# Patient Record
Sex: Female | Born: 1975 | Race: Black or African American | Hispanic: No | Smoking: Former smoker
Health system: Southern US, Community
[De-identification: ages and names within clinical notes are randomized; demographics above are authoritative.]

## PROBLEM LIST (undated history)

## (undated) DIAGNOSIS — M1990 Unspecified osteoarthritis, unspecified site: Secondary | ICD-10-CM

## (undated) DIAGNOSIS — R011 Cardiac murmur, unspecified: Secondary | ICD-10-CM

## (undated) DIAGNOSIS — F32A Depression, unspecified: Secondary | ICD-10-CM

## (undated) DIAGNOSIS — D649 Anemia, unspecified: Secondary | ICD-10-CM

## (undated) DIAGNOSIS — Z9889 Other specified postprocedural states: Secondary | ICD-10-CM

## (undated) DIAGNOSIS — F419 Anxiety disorder, unspecified: Secondary | ICD-10-CM

## (undated) DIAGNOSIS — R112 Nausea with vomiting, unspecified: Secondary | ICD-10-CM

## (undated) HISTORY — PX: ABDOMINAL HYSTERECTOMY: SHX81

## (undated) HISTORY — PX: BREAST SURGERY: SHX581

---

## 1997-12-10 ENCOUNTER — Emergency Department (HOSPITAL_COMMUNITY): Admission: EM | Admit: 1997-12-10 | Discharge: 1997-12-10 | Payer: Self-pay | Admitting: Emergency Medicine

## 1998-06-30 ENCOUNTER — Emergency Department (HOSPITAL_COMMUNITY): Admission: EM | Admit: 1998-06-30 | Discharge: 1998-06-30 | Payer: Self-pay | Admitting: Emergency Medicine

## 1998-10-14 ENCOUNTER — Encounter: Admission: RE | Admit: 1998-10-14 | Discharge: 1998-10-14 | Payer: Self-pay | Admitting: Family Medicine

## 1998-10-15 ENCOUNTER — Encounter: Admission: RE | Admit: 1998-10-15 | Discharge: 1998-10-15 | Payer: Self-pay | Admitting: Family Medicine

## 1998-10-19 ENCOUNTER — Encounter: Admission: RE | Admit: 1998-10-19 | Discharge: 1998-10-19 | Payer: Self-pay | Admitting: Family Medicine

## 1998-10-26 ENCOUNTER — Ambulatory Visit (HOSPITAL_COMMUNITY): Admission: RE | Admit: 1998-10-26 | Discharge: 1998-10-26 | Payer: Self-pay | Admitting: *Deleted

## 1998-11-29 ENCOUNTER — Encounter: Admission: RE | Admit: 1998-11-29 | Discharge: 1998-11-29 | Payer: Self-pay | Admitting: Family Medicine

## 1998-12-23 ENCOUNTER — Encounter: Admission: RE | Admit: 1998-12-23 | Discharge: 1998-12-23 | Payer: Self-pay | Admitting: Family Medicine

## 1998-12-30 ENCOUNTER — Encounter: Admission: RE | Admit: 1998-12-30 | Discharge: 1998-12-30 | Payer: Self-pay | Admitting: Family Medicine

## 1999-01-05 ENCOUNTER — Inpatient Hospital Stay (HOSPITAL_COMMUNITY): Admission: AD | Admit: 1999-01-05 | Discharge: 1999-01-05 | Payer: Self-pay | Admitting: Obstetrics

## 1999-01-07 ENCOUNTER — Encounter: Admission: RE | Admit: 1999-01-07 | Discharge: 1999-01-07 | Payer: Self-pay | Admitting: Family Medicine

## 1999-02-02 ENCOUNTER — Ambulatory Visit (HOSPITAL_COMMUNITY): Admission: RE | Admit: 1999-02-02 | Discharge: 1999-02-02 | Payer: Self-pay

## 1999-02-10 ENCOUNTER — Encounter: Admission: RE | Admit: 1999-02-10 | Discharge: 1999-02-10 | Payer: Self-pay | Admitting: Family Medicine

## 1999-03-11 ENCOUNTER — Encounter: Admission: RE | Admit: 1999-03-11 | Discharge: 1999-03-11 | Payer: Self-pay | Admitting: Family Medicine

## 1999-04-04 ENCOUNTER — Inpatient Hospital Stay (HOSPITAL_COMMUNITY): Admission: AD | Admit: 1999-04-04 | Discharge: 1999-04-04 | Payer: Self-pay | Admitting: Obstetrics & Gynecology

## 1999-04-05 ENCOUNTER — Encounter: Admission: RE | Admit: 1999-04-05 | Discharge: 1999-04-05 | Payer: Self-pay | Admitting: Sports Medicine

## 1999-04-12 ENCOUNTER — Inpatient Hospital Stay (HOSPITAL_COMMUNITY): Admission: AD | Admit: 1999-04-12 | Discharge: 1999-04-12 | Payer: Self-pay | Admitting: *Deleted

## 1999-04-12 ENCOUNTER — Encounter: Admission: RE | Admit: 1999-04-12 | Discharge: 1999-04-12 | Payer: Self-pay | Admitting: Sports Medicine

## 1999-04-18 ENCOUNTER — Encounter: Admission: RE | Admit: 1999-04-18 | Discharge: 1999-04-18 | Payer: Self-pay | Admitting: Family Medicine

## 1999-04-29 ENCOUNTER — Inpatient Hospital Stay (HOSPITAL_COMMUNITY): Admission: AD | Admit: 1999-04-29 | Discharge: 1999-04-29 | Payer: Self-pay | Admitting: *Deleted

## 1999-04-29 ENCOUNTER — Encounter: Admission: RE | Admit: 1999-04-29 | Discharge: 1999-04-29 | Payer: Self-pay | Admitting: Family Medicine

## 1999-05-11 ENCOUNTER — Encounter: Admission: RE | Admit: 1999-05-11 | Discharge: 1999-05-11 | Payer: Self-pay | Admitting: Family Medicine

## 1999-05-16 ENCOUNTER — Inpatient Hospital Stay (HOSPITAL_COMMUNITY): Admission: AD | Admit: 1999-05-16 | Discharge: 1999-05-16 | Payer: Self-pay | Admitting: Obstetrics & Gynecology

## 1999-05-19 ENCOUNTER — Encounter: Admission: RE | Admit: 1999-05-19 | Discharge: 1999-05-19 | Payer: Self-pay | Admitting: Family Medicine

## 1999-05-26 ENCOUNTER — Encounter: Admission: RE | Admit: 1999-05-26 | Discharge: 1999-05-26 | Payer: Self-pay | Admitting: Family Medicine

## 1999-05-29 ENCOUNTER — Inpatient Hospital Stay (HOSPITAL_COMMUNITY): Admission: AD | Admit: 1999-05-29 | Discharge: 1999-05-29 | Payer: Self-pay | Admitting: Obstetrics & Gynecology

## 1999-06-01 ENCOUNTER — Inpatient Hospital Stay (HOSPITAL_COMMUNITY): Admission: AD | Admit: 1999-06-01 | Discharge: 1999-06-04 | Payer: Self-pay | Admitting: *Deleted

## 1999-06-13 HISTORY — PX: REDUCTION MAMMAPLASTY: SUR839

## 1999-07-06 ENCOUNTER — Encounter: Admission: RE | Admit: 1999-07-06 | Discharge: 1999-07-06 | Payer: Self-pay | Admitting: Family Medicine

## 1999-07-11 ENCOUNTER — Encounter: Admission: RE | Admit: 1999-07-11 | Discharge: 1999-07-11 | Payer: Self-pay | Admitting: Family Medicine

## 1999-10-25 ENCOUNTER — Encounter: Admission: RE | Admit: 1999-10-25 | Discharge: 1999-10-25 | Payer: Self-pay | Admitting: Family Medicine

## 2000-07-04 ENCOUNTER — Encounter: Admission: RE | Admit: 2000-07-04 | Discharge: 2000-07-04 | Payer: Self-pay | Admitting: Family Medicine

## 2000-07-09 ENCOUNTER — Emergency Department (HOSPITAL_COMMUNITY): Admission: EM | Admit: 2000-07-09 | Discharge: 2000-07-09 | Payer: Self-pay | Admitting: Emergency Medicine

## 2000-07-17 ENCOUNTER — Encounter: Admission: RE | Admit: 2000-07-17 | Discharge: 2000-07-17 | Payer: Self-pay | Admitting: Family Medicine

## 2000-07-26 ENCOUNTER — Encounter: Admission: RE | Admit: 2000-07-26 | Discharge: 2000-07-26 | Payer: Self-pay | Admitting: Family Medicine

## 2000-10-04 ENCOUNTER — Encounter: Payer: Self-pay | Admitting: Emergency Medicine

## 2000-10-04 ENCOUNTER — Emergency Department (HOSPITAL_COMMUNITY): Admission: EM | Admit: 2000-10-04 | Discharge: 2000-10-04 | Payer: Self-pay | Admitting: Emergency Medicine

## 2000-10-10 ENCOUNTER — Encounter: Admission: RE | Admit: 2000-10-10 | Discharge: 2000-10-10 | Payer: Self-pay | Admitting: Family Medicine

## 2000-11-13 ENCOUNTER — Emergency Department (HOSPITAL_COMMUNITY): Admission: EM | Admit: 2000-11-13 | Discharge: 2000-11-13 | Payer: Self-pay | Admitting: *Deleted

## 2001-04-19 ENCOUNTER — Encounter: Admission: RE | Admit: 2001-04-19 | Discharge: 2001-04-19 | Payer: Self-pay | Admitting: Family Medicine

## 2001-07-24 ENCOUNTER — Encounter (INDEPENDENT_AMBULATORY_CARE_PROVIDER_SITE_OTHER): Payer: Self-pay

## 2001-07-24 ENCOUNTER — Ambulatory Visit (HOSPITAL_BASED_OUTPATIENT_CLINIC_OR_DEPARTMENT_OTHER): Admission: RE | Admit: 2001-07-24 | Discharge: 2001-07-25 | Payer: Self-pay | Admitting: Plastic Surgery

## 2002-01-15 ENCOUNTER — Inpatient Hospital Stay (HOSPITAL_COMMUNITY): Admission: AD | Admit: 2002-01-15 | Discharge: 2002-01-15 | Payer: Self-pay | Admitting: Obstetrics and Gynecology

## 2002-01-15 ENCOUNTER — Encounter: Payer: Self-pay | Admitting: Obstetrics and Gynecology

## 2002-10-29 ENCOUNTER — Encounter: Admission: RE | Admit: 2002-10-29 | Discharge: 2002-10-29 | Payer: Self-pay | Admitting: Family Medicine

## 2003-07-22 ENCOUNTER — Emergency Department (HOSPITAL_COMMUNITY): Admission: EM | Admit: 2003-07-22 | Discharge: 2003-07-22 | Payer: Self-pay | Admitting: Emergency Medicine

## 2003-08-10 ENCOUNTER — Encounter: Admission: RE | Admit: 2003-08-10 | Discharge: 2003-08-10 | Payer: Self-pay | Admitting: Family Medicine

## 2004-01-01 ENCOUNTER — Encounter: Admission: RE | Admit: 2004-01-01 | Discharge: 2004-01-01 | Payer: Self-pay | Admitting: Family Medicine

## 2004-01-22 ENCOUNTER — Encounter: Admission: RE | Admit: 2004-01-22 | Discharge: 2004-01-22 | Payer: Self-pay | Admitting: Family Medicine

## 2004-05-12 ENCOUNTER — Ambulatory Visit: Payer: Self-pay | Admitting: Family Medicine

## 2004-10-21 ENCOUNTER — Ambulatory Visit: Payer: Self-pay | Admitting: Family Medicine

## 2004-12-23 ENCOUNTER — Ambulatory Visit: Payer: Self-pay | Admitting: Family Medicine

## 2005-05-26 ENCOUNTER — Ambulatory Visit: Payer: Self-pay | Admitting: Family Medicine

## 2005-07-13 ENCOUNTER — Encounter (INDEPENDENT_AMBULATORY_CARE_PROVIDER_SITE_OTHER): Payer: Self-pay | Admitting: *Deleted

## 2005-07-16 ENCOUNTER — Inpatient Hospital Stay (HOSPITAL_COMMUNITY): Admission: AD | Admit: 2005-07-16 | Discharge: 2005-07-16 | Payer: Self-pay | Admitting: Obstetrics & Gynecology

## 2005-07-27 ENCOUNTER — Ambulatory Visit: Payer: Self-pay | Admitting: Family Medicine

## 2005-07-31 ENCOUNTER — Inpatient Hospital Stay (HOSPITAL_COMMUNITY): Admission: RE | Admit: 2005-07-31 | Discharge: 2005-07-31 | Payer: Self-pay | Admitting: Obstetrics & Gynecology

## 2005-08-03 ENCOUNTER — Ambulatory Visit: Payer: Self-pay | Admitting: Family Medicine

## 2005-08-23 ENCOUNTER — Ambulatory Visit: Payer: Self-pay | Admitting: Family Medicine

## 2005-08-29 ENCOUNTER — Ambulatory Visit: Payer: Self-pay | Admitting: Family Medicine

## 2005-09-26 ENCOUNTER — Ambulatory Visit: Payer: Self-pay | Admitting: Family Medicine

## 2005-10-04 ENCOUNTER — Inpatient Hospital Stay (HOSPITAL_COMMUNITY): Admission: AD | Admit: 2005-10-04 | Discharge: 2005-10-04 | Payer: Self-pay | Admitting: Gynecology

## 2005-10-17 ENCOUNTER — Ambulatory Visit (HOSPITAL_COMMUNITY): Admission: RE | Admit: 2005-10-17 | Discharge: 2005-10-17 | Payer: Self-pay | Admitting: Gynecology

## 2005-10-17 ENCOUNTER — Ambulatory Visit: Payer: Self-pay | Admitting: Family Medicine

## 2005-10-25 ENCOUNTER — Inpatient Hospital Stay (HOSPITAL_COMMUNITY): Admission: AD | Admit: 2005-10-25 | Discharge: 2005-10-25 | Payer: Self-pay | Admitting: Obstetrics and Gynecology

## 2005-11-15 ENCOUNTER — Ambulatory Visit: Payer: Self-pay | Admitting: Family Medicine

## 2005-11-22 ENCOUNTER — Ambulatory Visit: Payer: Self-pay | Admitting: Family Medicine

## 2005-12-11 ENCOUNTER — Inpatient Hospital Stay (HOSPITAL_COMMUNITY): Admission: AD | Admit: 2005-12-11 | Discharge: 2005-12-11 | Payer: Self-pay | Admitting: Obstetrics & Gynecology

## 2005-12-11 ENCOUNTER — Ambulatory Visit: Payer: Self-pay | Admitting: Family Medicine

## 2005-12-12 ENCOUNTER — Ambulatory Visit: Payer: Self-pay | Admitting: Sports Medicine

## 2005-12-19 ENCOUNTER — Ambulatory Visit: Payer: Self-pay | Admitting: Sports Medicine

## 2006-01-05 ENCOUNTER — Ambulatory Visit: Payer: Self-pay | Admitting: Family Medicine

## 2006-01-11 ENCOUNTER — Inpatient Hospital Stay (HOSPITAL_COMMUNITY): Admission: AD | Admit: 2006-01-11 | Discharge: 2006-01-11 | Payer: Self-pay | Admitting: Obstetrics and Gynecology

## 2006-01-11 ENCOUNTER — Ambulatory Visit: Payer: Self-pay | Admitting: Family Medicine

## 2006-01-12 ENCOUNTER — Ambulatory Visit: Payer: Self-pay | Admitting: Obstetrics & Gynecology

## 2006-01-12 ENCOUNTER — Inpatient Hospital Stay (HOSPITAL_COMMUNITY): Admission: AD | Admit: 2006-01-12 | Discharge: 2006-01-12 | Payer: Self-pay | Admitting: Obstetrics & Gynecology

## 2006-01-18 ENCOUNTER — Ambulatory Visit: Payer: Self-pay | Admitting: Family Medicine

## 2006-01-22 ENCOUNTER — Ambulatory Visit: Payer: Self-pay | Admitting: Obstetrics and Gynecology

## 2006-01-22 ENCOUNTER — Inpatient Hospital Stay (HOSPITAL_COMMUNITY): Admission: AD | Admit: 2006-01-22 | Discharge: 2006-01-22 | Payer: Self-pay | Admitting: Family Medicine

## 2006-02-07 ENCOUNTER — Ambulatory Visit: Payer: Self-pay | Admitting: Family Medicine

## 2006-02-13 ENCOUNTER — Ambulatory Visit: Payer: Self-pay | Admitting: Family Medicine

## 2006-02-13 ENCOUNTER — Inpatient Hospital Stay (HOSPITAL_COMMUNITY): Admission: AD | Admit: 2006-02-13 | Discharge: 2006-02-13 | Payer: Self-pay | Admitting: Obstetrics and Gynecology

## 2006-02-21 ENCOUNTER — Ambulatory Visit: Payer: Self-pay | Admitting: Sports Medicine

## 2006-02-23 ENCOUNTER — Ambulatory Visit: Payer: Self-pay | Admitting: Family Medicine

## 2006-02-23 ENCOUNTER — Inpatient Hospital Stay (HOSPITAL_COMMUNITY): Admission: AD | Admit: 2006-02-23 | Discharge: 2006-02-24 | Payer: Self-pay | Admitting: Family Medicine

## 2006-02-25 ENCOUNTER — Ambulatory Visit: Payer: Self-pay | Admitting: Obstetrics and Gynecology

## 2006-02-25 ENCOUNTER — Inpatient Hospital Stay (HOSPITAL_COMMUNITY): Admission: AD | Admit: 2006-02-25 | Discharge: 2006-02-25 | Payer: Self-pay | Admitting: Gynecology

## 2006-03-01 ENCOUNTER — Ambulatory Visit: Payer: Self-pay | Admitting: Obstetrics and Gynecology

## 2006-03-01 ENCOUNTER — Inpatient Hospital Stay (HOSPITAL_COMMUNITY): Admission: AD | Admit: 2006-03-01 | Discharge: 2006-03-02 | Payer: Self-pay | Admitting: Family Medicine

## 2006-03-02 ENCOUNTER — Ambulatory Visit: Payer: Self-pay | Admitting: Family Medicine

## 2006-03-08 ENCOUNTER — Ambulatory Visit: Payer: Self-pay | Admitting: Family Medicine

## 2006-03-15 ENCOUNTER — Ambulatory Visit: Payer: Self-pay | Admitting: Family Medicine

## 2006-03-16 ENCOUNTER — Ambulatory Visit: Payer: Self-pay | Admitting: Certified Nurse Midwife

## 2006-03-16 ENCOUNTER — Inpatient Hospital Stay (HOSPITAL_COMMUNITY): Admission: AD | Admit: 2006-03-16 | Discharge: 2006-03-17 | Payer: Self-pay | Admitting: Obstetrics and Gynecology

## 2006-03-17 ENCOUNTER — Inpatient Hospital Stay (HOSPITAL_COMMUNITY): Admission: AD | Admit: 2006-03-17 | Discharge: 2006-03-17 | Payer: Self-pay | Admitting: Obstetrics and Gynecology

## 2006-03-19 ENCOUNTER — Ambulatory Visit: Payer: Self-pay | Admitting: Obstetrics & Gynecology

## 2006-03-19 ENCOUNTER — Inpatient Hospital Stay (HOSPITAL_COMMUNITY): Admission: AD | Admit: 2006-03-19 | Discharge: 2006-03-22 | Payer: Self-pay | Admitting: Gynecology

## 2006-03-26 ENCOUNTER — Ambulatory Visit: Payer: Self-pay | Admitting: Family Medicine

## 2006-04-10 ENCOUNTER — Ambulatory Visit: Payer: Self-pay | Admitting: Sports Medicine

## 2006-06-28 ENCOUNTER — Emergency Department (HOSPITAL_COMMUNITY): Admission: EM | Admit: 2006-06-28 | Discharge: 2006-06-28 | Payer: Self-pay | Admitting: Family Medicine

## 2006-08-09 DIAGNOSIS — G43909 Migraine, unspecified, not intractable, without status migrainosus: Secondary | ICD-10-CM | POA: Insufficient documentation

## 2006-08-09 DIAGNOSIS — D573 Sickle-cell trait: Secondary | ICD-10-CM

## 2006-08-10 ENCOUNTER — Encounter (INDEPENDENT_AMBULATORY_CARE_PROVIDER_SITE_OTHER): Payer: Self-pay | Admitting: *Deleted

## 2006-12-24 ENCOUNTER — Ambulatory Visit: Payer: Self-pay | Admitting: Family Medicine

## 2006-12-24 ENCOUNTER — Encounter (INDEPENDENT_AMBULATORY_CARE_PROVIDER_SITE_OTHER): Payer: Self-pay | Admitting: Family Medicine

## 2006-12-26 ENCOUNTER — Encounter (INDEPENDENT_AMBULATORY_CARE_PROVIDER_SITE_OTHER): Payer: Self-pay | Admitting: Family Medicine

## 2006-12-26 LAB — CONVERTED CEMR LAB
Albumin: 4.1 g/dL (ref 3.5–5.2)
Alkaline Phosphatase: 53 units/L (ref 39–117)
Anti Nuclear Antibody(ANA): NEGATIVE
BUN: 11 mg/dL (ref 6–23)
CO2: 25 meq/L (ref 19–32)
Cholesterol: 136 mg/dL (ref 0–200)
Glucose, Bld: 85 mg/dL (ref 70–99)
HDL: 43 mg/dL (ref >39)
Hemoglobin: 11.7 g/dL — ABNORMAL LOW (ref 12.0–15.0)
MCHC: 32 g/dL (ref 30.0–36.0)
MCV: 66.8 fL — ABNORMAL LOW (ref 78.0–100.0)
Potassium: 4.3 meq/L (ref 3.5–5.3)
RBC: 5.48 M/uL — ABNORMAL HIGH (ref 3.87–5.11)
Sodium: 141 meq/L (ref 135–145)
Total Bilirubin: 0.4 mg/dL (ref 0.3–1.2)
Total Protein: 6.9 g/dL (ref 6.0–8.3)
Triglycerides: 45 mg/dL (ref <150)
WBC: 5.5 10*3/uL (ref 4.0–10.5)

## 2006-12-31 ENCOUNTER — Telehealth: Payer: Self-pay | Admitting: *Deleted

## 2007-06-19 ENCOUNTER — Emergency Department (HOSPITAL_COMMUNITY): Admission: EM | Admit: 2007-06-19 | Discharge: 2007-06-19 | Payer: Self-pay | Admitting: *Deleted

## 2007-08-14 ENCOUNTER — Telehealth: Payer: Self-pay | Admitting: *Deleted

## 2007-08-15 ENCOUNTER — Telehealth: Payer: Self-pay | Admitting: *Deleted

## 2007-08-20 ENCOUNTER — Encounter (INDEPENDENT_AMBULATORY_CARE_PROVIDER_SITE_OTHER): Payer: Self-pay | Admitting: *Deleted

## 2007-08-20 ENCOUNTER — Ambulatory Visit: Payer: Self-pay | Admitting: Family Medicine

## 2007-08-21 ENCOUNTER — Telehealth: Payer: Self-pay | Admitting: *Deleted

## 2007-08-22 ENCOUNTER — Encounter (INDEPENDENT_AMBULATORY_CARE_PROVIDER_SITE_OTHER): Payer: Self-pay | Admitting: Obstetrics and Gynecology

## 2007-08-22 ENCOUNTER — Ambulatory Visit (HOSPITAL_COMMUNITY): Admission: RE | Admit: 2007-08-22 | Discharge: 2007-08-23 | Payer: Self-pay | Admitting: Obstetrics and Gynecology

## 2007-09-01 ENCOUNTER — Inpatient Hospital Stay (HOSPITAL_COMMUNITY): Admission: AD | Admit: 2007-09-01 | Discharge: 2007-09-01 | Payer: Self-pay | Admitting: Obstetrics and Gynecology

## 2008-04-29 ENCOUNTER — Encounter: Admission: RE | Admit: 2008-04-29 | Discharge: 2008-04-29 | Payer: Self-pay | Admitting: Obstetrics and Gynecology

## 2008-06-10 ENCOUNTER — Ambulatory Visit: Payer: Self-pay | Admitting: Family Medicine

## 2008-06-16 ENCOUNTER — Encounter: Payer: Self-pay | Admitting: Family Medicine

## 2008-06-16 ENCOUNTER — Emergency Department (HOSPITAL_COMMUNITY): Admission: EM | Admit: 2008-06-16 | Discharge: 2008-06-16 | Payer: Self-pay | Admitting: Family Medicine

## 2008-06-18 ENCOUNTER — Encounter (INDEPENDENT_AMBULATORY_CARE_PROVIDER_SITE_OTHER): Payer: Self-pay | Admitting: *Deleted

## 2008-07-15 ENCOUNTER — Emergency Department (HOSPITAL_COMMUNITY): Admission: EM | Admit: 2008-07-15 | Discharge: 2008-07-15 | Payer: Self-pay | Admitting: Emergency Medicine

## 2008-07-29 ENCOUNTER — Telehealth (INDEPENDENT_AMBULATORY_CARE_PROVIDER_SITE_OTHER): Payer: Self-pay | Admitting: *Deleted

## 2008-08-15 ENCOUNTER — Emergency Department (HOSPITAL_COMMUNITY): Admission: EM | Admit: 2008-08-15 | Discharge: 2008-08-15 | Payer: Self-pay | Admitting: Emergency Medicine

## 2008-08-17 ENCOUNTER — Emergency Department (HOSPITAL_COMMUNITY): Admission: EM | Admit: 2008-08-17 | Discharge: 2008-08-17 | Payer: Self-pay | Admitting: Emergency Medicine

## 2009-01-20 ENCOUNTER — Ambulatory Visit (HOSPITAL_COMMUNITY): Admission: RE | Admit: 2009-01-20 | Discharge: 2009-01-20 | Payer: Self-pay | Admitting: Family Medicine

## 2009-08-27 ENCOUNTER — Emergency Department (HOSPITAL_COMMUNITY): Admission: EM | Admit: 2009-08-27 | Discharge: 2009-08-27 | Payer: Self-pay | Admitting: Emergency Medicine

## 2010-04-10 ENCOUNTER — Emergency Department (HOSPITAL_COMMUNITY): Admission: EM | Admit: 2010-04-10 | Discharge: 2010-04-10 | Payer: Self-pay | Admitting: Family Medicine

## 2010-05-11 ENCOUNTER — Ambulatory Visit: Payer: Self-pay | Admitting: Family Medicine

## 2010-05-11 ENCOUNTER — Encounter: Payer: Self-pay | Admitting: Family Medicine

## 2010-05-11 DIAGNOSIS — R4586 Emotional lability: Secondary | ICD-10-CM | POA: Insufficient documentation

## 2010-05-11 DIAGNOSIS — E8941 Symptomatic postprocedural ovarian failure: Secondary | ICD-10-CM

## 2010-05-11 LAB — CONVERTED CEMR LAB
CO2: 28 meq/L (ref 19–32)
Chloride: 100 meq/L (ref 96–112)
Glucose, Bld: 82 mg/dL (ref 70–99)
HCT: 39.4 % (ref 36.0–46.0)
Hemoglobin: 13.8 g/dL (ref 12.0–15.0)
MCHC: 35 g/dL (ref 30.0–36.0)
MCV: 82.3 fL (ref 78.0–100.0)
Platelets: 310 10*3/uL (ref 150–400)
RDW: 12.6 % (ref 11.5–15.5)
Sodium: 141 meq/L (ref 135–145)

## 2010-07-12 NOTE — Assessment & Plan Note (Signed)
Summary: mood swings,df   Vital Signs:  Patient profile:   35 year old female Height:      61 inches Weight:      179 pounds BMI:     33.94 Pulse rate:   70 / minute BP sitting:   126 / 82  (right arm)  Vitals Entered By: Rochele Pages RN (May 11, 2010 2:52 PM) CC: mood swings x 1 yr  Pain Assessment Patient in pain? no        Primary Care Provider:  CAT TA MD  CC:  mood swings x 1 yr .  History of Present Illness: 35 y/o previously healthy F presents with changes in mood for the past year.    Mood changes:  Pt states that she feels her mood has changed, she has become more easy to anger and short temper.  She had a hysterectomy in 2009 for fibroids and in 2010 noticed sensations of hot flashes.  She started having mood swings in early 2011.  She thinks that this is related to her hysterectomy.  She did not take HRT after the TAH. Pt states she does not like the way she feels.  She is short temper.  Her children has noticed day.  She can feel it coming on, meaning that she can tell when she is about to become angry or frustrated.  She is worried that she may say something ugly and hurt someone's feeling.  Sleep: wakes up at least one time per night.  Overall sleeping at least 6 hrs at night.  Sleep has not changed much.    Appetite: "up and down".  [facility], who started her on appetite suppressant, which increased her energy level.  She stopped the appetite suppressant in Aug because she did not have anymore money to pay for it. She plans to resume this med in Jan.  Weight has been stable.   Symptoms have not affected her work because her coworkers are understanding.  When she feels that she is about to become upset or about to say something "ugly", she would tell them that she needs some quiet time.  Her coworkers would understand and would leave her be.  Her children have also noticed a change in her mood.  Her 16-y-o daughter would tell her 10-y-o  son, "Don't you see mom is not feeling well?  You need to be quiet." Her husband has also noticed that is is short tempered.  No Si/Hi No period of euphoria, not requiring sleep, not memory change/loss Energy level normal BM: sometimes she would not have a BM for 7 days.  Last BM was Sun, normal BM, not painful.   No heat or cold intolerance.    Habits & Providers  Alcohol-Tobacco-Diet     Tobacco Status: quit     Tobacco Counseling: to remain off tobacco products     Year Quit: 2007  Current Medications (verified): 1)  None  Allergies (verified): No Known Drug Allergies  Past History:  Past Surgical History: breast reduction - 05/26/2005,  colposcopy- 1997 - 01/22/2004 TAH in 2009 for fibroids, still has ovaries; [facility]  Social History: Works at [employer].; No tob, rare etoh; 2 children -  daughter(b. 1995), son (b2000)  Cell: [phone]  Review of Systems       per hpi   Physical Exam  General:  Well-developed,well-nourished,in no acute distress; alert,appropriate and cooperative throughout examination. Vitals reviewed.  Neurologic:  alert &  oriented X3.   Psych:  Oriented X3, memory intact for recent and remote, normally interactive, good eye contact, not anxious appearing, not depressed appearing, not agitated, not suicidal, and not homicidal.  Concentration intact.  Judgement fair.  No delusions or hallucinations.    Impression & Recommendations:  Problem # 1:  MOOD SWINGS (ICD-296.99) Assessment New Pt with some mood swings x 1 year.  She is short tempered and can feel when she is about "to say something ugly".  Depression screen was negative.  Pt has good judgement.   She had TAH in 2009 for fibroids and did not take HRT.  Gave pt reading material on HRT, but she states that she would not like to go this route.  We discussed an SSRI for mood swing.  She is amendable to this idea.  We discussed s/e: nausea, ha, insomnia, dry mouth.   Will start with zoloft 50mg  qAM x 4 wks.  Pt to rtc in 4 wks to see me.   Will check labs cbc, tsh, bmet to ensure that those are not the cause of her symptoms.   Orders: Basic Met-[facility] [id number]) CBC-[facility] [phone]) TSH-[facility] [phone]) Free T4-[facility] [phone]) [facility]- Est Level  3 (96295)  Problem # 2:  MENOPAUSE, SURGICAL (ICD-627.4) Assessment: Comment Only s/p TAH for fibroids.  Pt was not placed on HRT s/p surgery.  see above  Complete Medication List: 1)  Zoloft 50 Mg Tabs (Sertraline hcl) .Marland Kitchen.. 1 tab by mouth every morning  Patient Instructions: 1)  Please schedule a follow-up appointment in 4 weeks for mood swing.  2)  I will call you tomorrow or Friday for lab result. 3)  At that time we may choose to start a medicine to help your mood.  Prescriptions: ZOLOFT 50 MG TABS (SERTRALINE HCL) 1 tab by mouth every morning  #30 x 3   Entered and Authorized by:   Angeline Slim MD   Signed by:   Angeline Slim MD on 05/12/2010   Method used:   Electronically to        [facility]* (retail)       [address]       [address] Shipping/mailing       [place], [place]  [address]       Ph: [phone]       Fax: [phone]   RxID:   [id number]    Orders Added: 1)  Basic Met-[facility] [[id number]] 2)  CBC-[facility] [85027] 3)  TSH-[facility] [[id number]] 4)  Free T4-[facility] [[id number]] 5)  [facility]- Est Level  3 [27062]

## 2010-08-25 ENCOUNTER — Inpatient Hospital Stay (INDEPENDENT_AMBULATORY_CARE_PROVIDER_SITE_OTHER)
Admission: RE | Admit: 2010-08-25 | Discharge: 2010-08-25 | Disposition: A | Discharge status: Home / Self Care | Payer: 59 | Source: Ambulatory Visit | Attending: Family Medicine | Admitting: Family Medicine

## 2010-08-25 DIAGNOSIS — J029 Acute pharyngitis, unspecified: Secondary | ICD-10-CM

## 2010-08-25 LAB — POCT RAPID STREP A (OFFICE): Streptococcus, Group A Screen (Direct): NEGATIVE

## 2010-09-27 LAB — POCT CARDIAC MARKERS
CKMB, poc: <1 ng/mL — ABNORMAL LOW (ref 1.0–8.0)
Troponin i, poc: <0.05 ng/mL (ref 0.00–0.09)

## 2010-09-27 LAB — POCT I-STAT, CHEM 8
BUN: 10 mg/dL (ref 6–23)
Calcium, Ion: 1.17 mmol/L (ref 1.12–1.32)
Chloride: 103 mEq/L (ref 96–112)
Creatinine, Ser: 0.8 mg/dL (ref 0.4–1.2)
Glucose, Bld: 96 mg/dL (ref 70–99)
HCT: 44 % (ref 36.0–46.0)
Potassium: 3.6 mEq/L (ref 3.5–5.1)

## 2010-10-25 NOTE — Op Note (Signed)
Joanna [name], HAGGAR              ACCOUNT NO.:  [id number]   MEDICAL RECORD NO.:  [id number]          PATIENT TYPE:  OIB   LOCATION:  9303                          [facility]ILITY:  [facility]   PHYSICIAN:  Hal Morales, M.D.DATE OF BIRTH:  [birth year]   DATE OF PROCEDURE:  08/22/2007  DATE OF DISCHARGE:                               OPERATIVE REPORT   PREOPERATIVE DIAGNOSES:  Menorrhagia, uterine fibroids, status post  Filshie clip application for surgical sterilization.   POSTOPERATIVE DIAGNOSES:  Menorrhagia, uterine fibroids, status post  Filshie clip application for surgical sterilization, plus pelvic  adhesions.   PROCEDURE:  Laparoscopic lysis of adhesions, total vaginal hysterectomy  and bilateral salpingectomy for removal of Filshie clips.   SURGEON:  Hal Morales, M.D.   FIRST ASSISTANT:  Janine Limbo, M.D.   ANESTHESIA:  General orotracheal.   ESTIMATED BLOOD LOSS:  Two hundred fifty mL.   COMPLICATIONS:  None.   FINDINGS:  The uterus was enlarged to 8 week size.  There were dense  adhesions between the anterior uterus and the anterior abdominal wall.  There were Filshie clips on each of the fallopian tubes.  The ovaries  appeared within normal limits.   PROCEDURE IN DETAIL:  The patient was taken to the operating room after  appropriate identification and placed on the operating table.  After the  attainment of adequate general anesthesia she was placed in the modified  lithotomy position.  The abdomen, perineum and vagina were prepped with  multiple layers of Betadine and a Foley catheter inserted into the  bladder and connected to straight drainage.  The abdomen and perineum  were draped as a sterile field.  A Hulka tenaculum was placed on the  anterior cervix.  Subumbilical and suprapubic injection of quarter  percent Marcaine for a total of 10 mL was undertaken.  A subumbilical  incision was made and a Veress cannula was placed through that incision  into the peritoneal cavity.  A pneumoperitoneum was created with 3  liters of CO2.  The Veress cannula was removed and laparoscopic trocar  placed through that incision into the peritoneal cavity.  A laparoscope  was placed through the trocar sleeve.  Suprapubic incisions to the right  and left of midline were made and laparoscopic probe trocars placed  through those incisions into the peritoneal cavity under direct  visualization.  The above noted findings were made and documented.  Sharp dissection of the adhesions between the anterior abdominal wall  and the anterior uterus were undertaken.  Once this had been completed  the pneumoperitoneum was allowed to escape and the patient was prepared  for vaginal hysterectomy.   All instruments were removed from the trocar sleeves but the trocar  sleeves left in place.  A weighted speculum was placed in the vagina and  Lahey tenacula placed on the anterior and posterior surfaces of the  cervix.  The cervicovaginal mucosa was injected with a dilute solution  of Pitressin.  The cervix was circumscribed and the anterior vaginal  mucosa dissected off the anterior cervix.  The anterior peritoneum was  sharply entered  and the bladder blade placed.  The posterior peritoneum  was then sharply entered and tagged.  The uterosacral ligaments on  either side were clamped, cut and suture ligated.  These sutures were  held.  The paracervical tissues, uterine arteries, parametrial tissues  were then successively clamped, cut and suture ligated.  The uterus was  inverted and the upper pedicles clamped, cut, tied with a free tie and  suture ligated.  The uterus removed from the operative field.  The  fallopian tubes were then removed from each side with a combination of  clamping, cutting and suture ligating to allow removal of the fallopian  tubes and the attached Filshie clips.  Hemostasis was achieved with a  figure-of-eight suture all of 2-0 Vicryl at this  point.  Prior sutures  had been zero Vicryl.  The posterior cul-de-sac was treated with a  McCall culdoplasty suture incorporating the two uterosacral ligaments  and the intervening posterior peritoneum.  This was held.  The vaginal  angles were then created with the uterosacral ligament sutures placing  them in the anterior cuff and posterior cuff and tying down at the angle  on either side.  The remainder of the vaginal cuff was closed in a  single layer incorporating anterior vaginal mucosa, anterior peritoneum,  posterior peritoneum and posterior vaginal mucosa in each figure-of-  eight suture of zero Vicryl.  Hemostasis was noted to be adequate.  The  McCall culdoplasty suture was then tied down.  A 2 inch vaginal packing  was then placed into the vagina and the surgeon changed gloves to review  the pelvis laparoscopically.   The pneumoperitoneum was recreated and the laparoscope placed through  the subumbilical trocar sleeve.  It was documented that hemostasis was  adequate and the pelvis was copiously irrigated leaving approximately  100 mL of warm saline in the pelvis.  All instruments were then removed  from the peritoneal cavity under direct visualization as the CO2 was  allowed to escape.  The subumbilical incision was closed with a fascial  suture of zero Vicryl then subcuticular sutures of 3-0 Vicryl.  The  suprapubic incisions were closed with subcuticular sutures of 3-0  Vicryl.  Sterile dressings were applied.  The patient was awakened from  general anesthesia and taken to the recovery room in satisfactory  condition having tolerated the procedure well with sponge and instrument  counts correct.   SPECIMENS TO PATHOLOGY:  Uterus and cervix and right and left fallopian  tubes.      Hal Morales, M.D.  Electronically Signed     VPH/MEDQ  D:  08/22/2007  T:  08/23/2007  Job:  [id number]

## 2010-10-25 NOTE — H&P (Signed)
NAMEAARA, JACQUOT              ACCOUNT NO.:  [id number]   MEDICAL RECORD NO.:  [id number]          PATIENT TYPE:  AMB   LOCATION:  SDC                           [facility]ILITY:  [facility]   PHYSICIAN:  Hal Morales, M.D.DATE OF BIRTH:  [birth year]   DATE OF ADMISSION:  08/19/2007  DATE OF DISCHARGE:                              HISTORY & PHYSICAL   HISTORY OF PRESENT ILLNESS:  Joanna [name] is a 35 year old married African  American female para 3-0-2-3 who presents for laparoscopically-assisted  vaginal hysterectomy because of symptomatic uterine fibroids and  menorrhagia.  For the past several years, the patient has endured a 5-7-  day menstrual flow requiring a tampon with a pad that she changes every  2 hours.  In spite of this, she would occasionally soil her clothes.  She reports cramping with these periods which are rated at a 7/10 on a  10-point pain scale, but will respond to Extra Strength Tylenol or  ibuprofen 800 mg and decrease to 3/10 on a 10-point pain scale.  Adding  to these discomforts is the fact that the patient would have the same  menstrual-like bleeding 2 weeks after her period along with more  cramping.  She denies any urinary tract symptoms, changes in bowel  habits, fever, vaginitis symptoms, nausea or vomiting.  She does however  admit to dyspareunia.  A pelvic ultrasound with Sonohystogram done in  February 2009 showed a uterus measuring 10.4 x 7.09 x 5.57 cm and normal-  appearing ovaries.  A posterior fibroid measuring 1.71 x 1.59 x 1.62 cm  and a submucosal fibroid measuring 3.0 x 2.61 x 2.78 cm meters was also  noted.  On the Sonohystogram, a hypoechoic fundal mass separate from the  posterior fibroid was also observed measuring 2.63 x 2.02 cm.  The  patient underwent an endometrial biopsy at the same time and it returned  negative for hyperplasia, atypia or malignancy.  A review of both  medical and surgical options were given to the patient to include  hormone  therapy, hysteroscopic fibroid resection and endometrial  ablation along with hysterectomy.  Given that the patient has concluded  her desire for childbearing, she has consented to proceed with  hysterectomy as a means of definitive therapy.   PAST OBSTETRICS HISTORY:  Gravida 5, para 3-0-2-3 the patient had two  spontaneous vaginal births in 1995 and 2000, and a cesarean section in  2007.   GYNECOLOGY HISTORY:  Menarche 35 years old.  The patient's menstrual  flow is as previously outlined.  She uses bilateral tubal ligation as  her method of contraception.  Denies any history of abnormal Pap smears.  Has a remote history of chlamydia.  Her last normal Pap smear was  January 2009.   MEDICAL HISTORY:  1. Sickle cell trait.  2. Anemia.  3. Migraines.  4. Heart murmur.   SURGICAL HISTORY:  1. In 1997, D&C.  2. In 2002, breast reduction.  3. In 2007, bilateral tubal ligation.  4. The patient denies any history of blood transfusion.  She does      admit to  severe nausea and vomiting with anesthesia.   FAMILY HISTORY:  Positive for cardiovascular disease, asthma, thyroid  disease, rheumatoid arthritis, diabetes mellitus and renal disease.   HABITS:  She does not use tobacco, alcohol or illicit drugs.   CURRENT MEDICATIONS:  Iron which she takes once daily.   ALLERGIES:  No known drug allergies.   REVIEW OF SYSTEMS:  Chest pain was shortness of breath with exertion,  palpitations, joint pain.  She denies any headache, vision changes,  unilateral weakness, dizziness, rash, dysphasia and except as is  mentioned in history of present illness, the patient's review of systems  is negative (the patient will undergo evaluation for her symptoms with  her family physician prior to surgery).   PHYSICAL EXAMINATION:  VITAL SIGNS:  Blood pressure is 138/88, weight is  180, pulse is 78, height is 5 feet, 3/4 inches.  GENERAL:  Neck is supple without masses.  There is no thyromegaly or   cervical adenopathy.  HEART:  Regular rate and rhythm.  There is a 2/6 systolic ejection  murmur at the left sternal border.  LUNGS:  Clear.  There are no wheezes, rales or rhonchi.  BACK:  No CVA tenderness.  ABDOMEN:  No tenderness, masses or organomegaly.  EXTREMITIES:  No clubbing, cyanosis or edema.  PELVIC:  EG/BUS was within normal limits.  Vagina is rugous.  Cervix is  nontender without lesions.  Uterus appears 8-10 weeks size and  irregular.  There is no tenderness.  Adnexa no tenderness or masses.  Rectovaginal without masses.   IMPRESSION:  1. Symptomatic uterine fibroids.  2. Menorrhagia.  3. Metrorrhagia.  4. Dysmenorrhea.   DISPOSITION:  A discussion was held was held with the patient regarding  the indications for her procedure along with its risks which include but  are not limited to reaction to anesthesia, damage to adjacent organs,  infection, excessive bleeding, the possibility that her surgery may have  to be completed using an open abdominal incision, and the possibility  that her ovaries may need to be removed should they appear diseased.  The patient verbalized understanding of these risk and has consented to  proceed with a laparoscopically assisted vaginal hysterectomy at [facility] [place] on August 22, 2007. She underwent evaluation of  her chest pain by her primary md who cleared her for surgery.      [place]mira J. Adline Peals.      Hal Morales, M.D.  Electronically Signed   EJP/MEDQ  D:  08/19/2007  T:  08/20/2007  Job:  [id number]

## 2010-10-28 NOTE — Op Note (Signed)
[place]  Patient:    Joanna [name], Joanna [name] Visit Number: [id number] MRN: [id number]          Service Type: DSU Location: [facility] Attending Physician:  Loura Halt Ii Dictated by:   Alfredia Ferguson, M.D. Proc. Date: 07/24/01 Admit Date:  07/24/2001                             Operative Report  PREOPERATIVE DIAGNOSIS:  Bilateral macromastia.  POSTOPERATIVE DIAGNOSIS:  Bilateral macromastia.  OPERATION PERFORMED:  Bilateral reduction mammoplasty, 898 g removed from left breast and 918 g removed from right breast.  SURGEON:  Alfredia Ferguson, M.D.  FIRST ASSISTANT:  Teena Irani. Odis Luster, M.D.  ANESTHESIA:  General endotracheal anesthesia.  INDICATION FOR SURGERY:  This is a 35 year old black female, who desired elective reduction mammoplasty.  She complains of symptoms of heavy breasts, which cause grooving in her shoulders from the weight on her bra.  She also has neck and upper back pain.  She wished to have the breasts reduced to a small C if possible.  DESCRIPTION OF OPERATION:  Skin marks were placed on the patient in a standing position outlining a Wise skin pattern.  Inferior pedicle technique will be used for this procedure.  The patient was taken to the operating room, where her chest was prepped and draped in a sterile fashion after adequate general endotracheal anesthesia had been given.  An 8 cm wide base was drawn for her inferior pedicle in the central portion of the breast.  A 42 mm diameter circle was drawn around the nipple area.  The previously placed skin marks were now incised through the dermis sharply.  The pedicle was also incised through the skin.  The areolar was incised at the 42 mm diameter mark.  The pedicle was deepithelialized with the nipple-areolar complex left in place. The pedicle was dissected away from the surrounding breast tissue using electrocautery cutting on the bias away from the central portion of  the pedicle in order to ensure adequate connections to pectoralis fascia for vascular integrity.  Once the pedicle had been dissected completely from the surrounding breast tissue, it was inspected for its vascular connections and they were felt to be adequate.  The medial breast flap was now elevated off of the chest wall at the level of the pectoralis fascia.  This flap was undermined for a distance of 3-5 cm.  The central breast flap was likewise undermined off the pectoralis fascia for a distance of 5-6 cm.  The lateral breast flap was elevated in a similar fashion.  The excess dermoglandular tissue between the pedicle and the desired position of the medial, superior, and lateral breast flaps was now dissected away from a medial to lateral direction, thinning the medial breast flap to approximately 3 cm in thickness. The central excess breast tissue was dissected away from the central breast flap cutting on the bias leaving the thickness of the central breast flap approximately 5 cm.  Laterally, the excess tissue was dissected away from the lateral breast flap leaving the lateral breast flap approximately 2-3 cm in thickness.  The excess tissue had now been completely dissected away and was passed off for weight.  A total of 918 g was removed from this right breast. Hemostasis was meticulously accomplished using electrocautery.  The wound was copiously irrigated with saline irrigation.  After hemostasis had been achieved, closure was begun by  uniting the inferior corner of the vertical limb of the medial and lateral breast flaps to the central portion of the inframammary crease with a 2-0 Vicryl suture.  The superior corner of the vertical limb of the incision was closed with a similar suture.  Temporary cover over the right breast was placed and attention was directed to the left breast, where an identical procedure was performed.  Here, 898 g was removed from the left side.  After  achieving hemostasis and irrigation, closure of the two breasts was carried out simultaneously.  The inferior and superior corner of the vertical limb of the incision on the left side were closed in a similar fashion to the right.  The inframammary crease incision was closed using multiple 3-0 Vicryl sutures for the dermis.  The areolar was placed in its new location and fixed into position using a combination of 3-0 Vicryl for the dermis and a running 4-0 Vicryl subcuticular.  The vertical limb of the incision was closed using interrupted 3-0 Vicryl sutures in the dermis.  Skin edges were approximated using Steri-Strips.  The chest was cleansed, dried, and a bulky dressing was placed around the breasts with a circumferential wrap of 6 inch Ace bandage.  Estimated blood loss was 100 cc.  The patient was awakened, extubated, and transported to the recovery room in satisfactory condition. Dictated by:   Alfredia Ferguson, M.D. Attending Physician:  Loura Halt Ii DD:  07/24/01 TD:  07/24/01 Job: [id number]

## 2010-10-28 NOTE — Op Note (Signed)
Joanna [name], Joanna [name]              ACCOUNT NO.:  [id number]   MEDICAL RECORD NO.:  [id number]          PATIENT TYPE:  INP   LOCATION:  9102                          FACILITY:  [facility]   PHYSICIAN:  Lesly Dukes, M.D. DATE OF BIRTH:  06/08/1976   DATE OF PROCEDURE:  03/19/2006  DATE OF DISCHARGE:                                 OPERATIVE REPORT   PREOPERATIVE DIAGNOSIS:  Intrauterine pregnancy at [redacted] weeks gestation, cord  presentation.   POSTOPERATIVE DIAGNOSIS:  Intrauterine pregnancy at [redacted] weeks gestation, cord  presentation.   PROCEDURE:  Primary low transverse cesarean section.   SURGEON:  Lesly Dukes, M.D.   ASSISTANT:  Wilburt Finlay, M.D.   ANESTHESIA:  General.   SPECIMENS:  Placenta to LNP.   ESTIMATED BLOOD LOSS:  1000 mL.   COMPLICATIONS:  None.   FINDINGS:  Viable female infant, Apgars 8 and 9.   REASON FOR PROCEDURE:  The patient is a 35 year old gravida 5, para 2-0-2-2-  , at [redacted] weeks gestation who presented with spontaneous onset of labor.  On  cervical exam, the patient was found to be dilated 4-5 cm but unable to feel  presenting part.  Ultrasound was done which showed a cord presentation.  The  patient was taken for cesarean section.   DESCRIPTION OF PROCEDURE:  The patient was taken to the operating room where  she was given general anesthesia.  She was then prepped and draped in the  normal sterile fashion in the dorsal supine position with a leftward tilt.  A Pfannenstiel skin incision was then made with the scalpel and carried  through to the underlying layer of fascia.  The fascia was incised in the  midline and the incision extended laterally with the Mayo scissors.  The  superior aspect of the fascial incision was then grasped with Kocher clamps,  elevated, and the underlying rectus muscles dissected off bluntly.  Attention was then turned to the inferior aspect of the incision which, in a  similar fashion, was grasped, tented up with the  Kocher clamps, and the  rectus muscles dissected off bluntly.  The rectus muscles were then  separated in the midline, the peritoneum identified, and entered sharply  with good visualization of the bladder.  The bladder blade was inserted and  the lower uterine segment incised in a transverse fashion with the scalpel.  The bladder blade was removed and the infant's head delivered  atraumatically.  The nose and mouth were suctioned with the DeLee bulb and  cord clamped and tied.  There was a nuchal cord x1.  The infant was handed  off to the pediatrician.  Cord blood was obtained.  The placenta was then  removed manually, the uterus exteriorized, and cleared of all clots and  debris.  The uterine incision was repaired with #1 chromic in a running  locked fashion.  Pressure was applied on the uterine incision with good  hemostasis which was re-checked 3-4 times with excellent hemostasis.  The  gutters were cleared of all clots and the fascia was reapproximated with 0  Vicryl in a  running fashion.  The skin was closed with staples.  The patient tolerated  the procedure well.  Sponge, lap, and needle counts were correct x2.  2  grams of Ancef were given at cord clamp.  The patient was taken to the  recovery room in stable condition.     ______________________________  Paticia Stack, MD    ______________________________  Lesly Dukes, M.D.    LNJ/MEDQ  D:  03/19/2006  T:  03/20/2006  Job:  [id number]

## 2010-10-28 NOTE — Discharge Summary (Signed)
Joanna [name], Joanna [name]              ACCOUNT NO.:  [id number]   MEDICAL RECORD NO.:  [id number]          PATIENT TYPE:  OIB   LOCATION:  9303                          [facility]ACILITY:  [facility]   PHYSICIAN:  Hal Morales, M.D.DATE OF BIRTH:  [birth year]   DATE OF ADMISSION:  08/22/2007  DATE OF DISCHARGE:  08/23/2007                               DISCHARGE SUMMARY   DISCHARGE DIAGNOSES:  1. Symptomatic uterine fibroids.  2. Menorrhagia.   OPERATION:  On the date of admission, the patient underwent a  laparoscopically assisted vaginal hysterectomy with lysis of adhesions,  tolerating procedure well.  The patient was found to have a uterus  enlarged to approximately 8-week size with adhesions between the  anterior uterus and abdominal wall.   HISTORY OF PRESENT ILLNESS:  Joanna [name] is a 35 year old married African  American female, para 3-0-2-3, who presents for laparoscopically  assisted vaginal hysterectomy because of symptomatic uterine fibroids  and menorrhagia.  Please see the patient's dictated history and physical  examination for details.   PREOPERATIVE PHYSICAL EXAM:  VITAL SIGNS:  Blood pressure is 138/88,  weight is 180 pounds, pulse is 78, and height is 5 feet 3-1/4 inches  tall.  GENERAL:  Within normal limits.  HEART:  However, it was noted on her heart exam that she had a 2/6  systolic ejection murmur at the left sternal border.  PELVIC:  EGBUS was within normal limits.  Vagina was rugose.  Cervix was  nontender without lesions.  Uterus appeared 8-10 week size and  irregular.  There was no tenderness.  Adnexa without tenderness or  masses.  Rectovaginal without masses.   HOSPITAL COURSE:  On the day of admission, the patient underwent  aforementioned procedures, tolerating them all well.  Postoperative  course was unremarkable with the patient tolerating a postop hemoglobin  of 9.2 (preop hemoglobin was 11.4).  On postop day #1, the patient had  resumed bowel and bladder  function and was therefore deemed ready for  discharge home.   DISCHARGE MEDICATIONS:  1. Ibuprofen 600 mg every 6 hours for 3 days, then as needed for pain.  2. Percocet 1-2 tablets every 4 hours as needed for pain (5/325 mg).  3. Feosol 1 tablet daily.   FOLLOWUP:  The patient is to call [facility] to schedule a  6-week postoperative visit with Dr. Pennie Rushing.   DISCHARGE INSTRUCTIONS:  The patient was given a copy of [facility] postoperative instruction sheet.  She was further  advised to avoid driving for 2 weeks, heavy lifting for 4 weeks, and  intercourse for 6 weeks, then she may shower, bathe, walk up steps, and  should increase her activity slowly.  The patient's diet was without  restriction.  Final pathology was not available at the time of this  dictation.      Joanna [name].      Hal Morales, M.D.  Electronically Signed    EJP/MEDQ  D:  09/06/2007  T:  09/06/2007  Job:  [id number]

## 2010-10-28 NOTE — Op Note (Signed)
NAMELEN, KLUVER              ACCOUNT NO.:  [id number]   MEDICAL RECORD NO.:  [id number]          PATIENT TYPE:  INP   LOCATION:  9102                          [facility]ACILITY:  [facility]   PHYSICIAN:  Lesly Dukes, M.D. DATE OF BIRTH:  February 23, 1976   DATE OF PROCEDURE:  03/19/2006  DATE OF DISCHARGE:                                 OPERATIVE REPORT   ADDENDUM:   PROCEDURE:  Primary low transverse cesarean section with bilateral tubal  ligation.   When uterus was exteriorized, fallopian tubes were identified, separated  from its long ligaments.  The left tube was identified, grasped with Babcock  clamps, and stapled approximately 4 cm from the cornual region.  The right  fallopian tube was also identified and stapled in a similar fashion.  The  rest of the procedure as dictated previously.     ______________________________  Paticia Stack, MD    ______________________________  Lesly Dukes, M.D.    LNJ/MEDQ  D:  03/19/2006  T:  03/20/2006  Job:  [id number]

## 2010-10-28 NOTE — Discharge Summary (Signed)
NAMEAIYANNA, AWTREY              ACCOUNT NO.:  [id number]   MEDICAL RECORD NO.:  [id number]          PATIENT TYPE:  INP   LOCATION:  9102                          [facility]ILITY:  [facility]   PHYSICIAN:  Ginger Carne, MD  DATE OF BIRTH:  1975/06/30   DATE OF ADMISSION:  03/19/2006  DATE OF DISCHARGE:  03/22/2006                                 DISCHARGE SUMMARY   ADMISSION DIAGNOSIS:  Term intrauterine pregnancy, in active labor.   DISCHARGE DIAGNOSES:  1. History of sickle cell trait.  2. Intrauterine pregnancy, delivered via low transverse cesarean section      secondary to cord presentation.  3. Bilateral tubal ligation.   HOSPITAL COURSE:  Ms. Jearld Fenton is a 35 year old female with a history of sickle  cell trait, who was a G5, now P3 0-2-3, who delivered intrauterine pregnancy  at 40 weeks via low transverse cesarean section secondary to cord  presentation.  Patient had an uncomplicated postop period and also underwent  bilateral tubal ligation on March 19, 2006 and delivered a viable female in  vertex position weighing 7 pounds, 8 ounces.  Uncomplicated postop course  and went home on postop day #3.   DISCHARGE LABS:  Blood type O+.  Hemoglobin 7.7.  GBS positive.  HIV  nonreactive.  RPR nonreactive.  Rubella nonimmune.  Patient declined Rubella  vaccination prior to discharge.   DISCHARGE MEDICATIONS:  1. Ibuprofen 200 mg over-the-counter every 4-6 hours p.r.n. pain.  2. Percocet 5/325 1 tab p.o. q.6h. p.r.n. severe pain.  3. Continue prenatal vitamin 1 tab daily as well as Colace 100 mg p.o.      b.i.d.   Patient will follow up at [facility] on March 26, 2006 for  staple removal and will follow up with her primary care physician, Dr. Corliss Marcus within six weeks.      Alanson Puls, M.D.      Ginger Carne, MD  Electronically Signed    MR/MEDQ  D:  03/22/2006  T:  03/23/2006  Job:  [id number]

## 2011-03-02 LAB — DIFFERENTIAL
Basophils Relative: 0
Eosinophils Relative: 1
Lymphs Abs: 2.2
Monocytes Relative: 7
Neutro Abs: 3.2

## 2011-03-02 LAB — CBC
HCT: 33 — ABNORMAL LOW
Hemoglobin: 10.7 — ABNORMAL LOW
Platelets: 430 — ABNORMAL HIGH
RBC: 5.09
WBC: 5.9

## 2011-03-02 LAB — I-STAT 8, (EC8 V) (CONVERTED LAB)
BUN: 7
Bicarbonate: 26.1 — ABNORMAL HIGH
Chloride: 106
Glucose, Bld: 90
HCT: 35 — ABNORMAL LOW
Hemoglobin: 11.9 — ABNORMAL LOW
Operator id: 198171
Potassium: 3.7
Sodium: 140
TCO2: 27
pCO2, Ven: 46.3
pH, Ven: 7.359 — ABNORMAL HIGH

## 2011-03-02 LAB — POCT CARDIAC MARKERS
CKMB, poc: <1 — ABNORMAL LOW
Myoglobin, poc: 49.5
Operator id: 198171
Troponin i, poc: <0.05

## 2011-03-02 LAB — PREGNANCY, URINE: Preg Test, Ur: NEGATIVE

## 2011-03-02 LAB — POCT I-STAT CREATININE: Creatinine, Ser: 0.8

## 2011-03-06 LAB — DIFFERENTIAL
Basophils Absolute: 0
Eosinophils Relative: 0
Lymphocytes Relative: 18
Monocytes Absolute: 0.9

## 2011-03-06 LAB — URINALYSIS, ROUTINE W REFLEX MICROSCOPIC
Nitrite: NEGATIVE
Protein, ur: NEGATIVE
Urobilinogen, UA: 0.2

## 2011-03-06 LAB — CBC
HCT: 36
MCHC: 32.5
MCV: 65.1 — ABNORMAL LOW
Platelets: 372
Platelets: 475 — ABNORMAL HIGH
RDW: 19.3 — ABNORMAL HIGH
RDW: 19.4 — ABNORMAL HIGH
WBC: 11.6 — ABNORMAL HIGH

## 2011-03-06 LAB — COMPREHENSIVE METABOLIC PANEL
AST: 15
Albumin: 3.5
Alkaline Phosphatase: 52
Chloride: 104
Creatinine, Ser: 0.71
GFR calc Af Amer: >60
Potassium: 3.5
Sodium: 136
Total Bilirubin: 1

## 2011-07-21 ENCOUNTER — Emergency Department (HOSPITAL_COMMUNITY)
Admission: EM | Admit: 2011-07-21 | Discharge: 2011-07-21 | Disposition: A | Discharge status: Home / Self Care | Payer: 59 | Attending: Emergency Medicine | Admitting: Emergency Medicine

## 2011-07-21 ENCOUNTER — Encounter (HOSPITAL_COMMUNITY): Payer: Self-pay | Admitting: Emergency Medicine

## 2011-07-21 DIAGNOSIS — G43909 Migraine, unspecified, not intractable, without status migrainosus: Secondary | ICD-10-CM | POA: Insufficient documentation

## 2011-07-21 DIAGNOSIS — R112 Nausea with vomiting, unspecified: Secondary | ICD-10-CM | POA: Insufficient documentation

## 2011-07-21 MED ORDER — PROMETHAZINE HCL 25 MG/ML IJ SOLN
25.0000 mg | Freq: Four times a day (QID) | INTRAMUSCULAR | Status: DC | PRN
  Administered 2011-07-21: 25 mg via INTRAVENOUS
  Filled 2011-07-21: qty 1

## 2011-07-21 MED ORDER — KETOROLAC TROMETHAMINE 30 MG/ML IJ SOLN
30.0000 mg | Freq: Once | INTRAMUSCULAR | Status: AC
  Administered 2011-07-21: 30 mg via INTRAVENOUS
  Filled 2011-07-21: qty 1

## 2011-07-21 MED ORDER — DIPHENHYDRAMINE HCL 50 MG/ML IJ SOLN
25.0000 mg | Freq: Once | INTRAMUSCULAR | Status: AC
  Administered 2011-07-21: 25 mg via INTRAVENOUS
  Filled 2011-07-21: qty 1

## 2011-07-21 MED ORDER — SODIUM CHLORIDE 0.9 % IV BOLUS (SEPSIS)
1000.0000 mL | Freq: Once | INTRAVENOUS | Status: AC
  Administered 2011-07-21: 1000 mL via INTRAVENOUS

## 2011-07-21 NOTE — ED Notes (Signed)
Family at bedside. 

## 2011-07-21 NOTE — ED Provider Notes (Signed)
History     CSN: [id number]  Arrival date & time 07/21/11  0559   First MD Initiated Contact with Patient 07/21/11 [phone]      Chief Complaint  Patient presents with  . Migraine    (Consider location/radiation/quality/duration/timing/severity/associated sxs/prior treatment) HPI Comments: Patient reports symptoms of her typical migraine that began yesterday morning when she woke up.  States she started with a what she thought was a normal.  Headache, and took one Tylenol, but the headache progressed throughout the day.  Pain is located behind her bilateral eyes and for head.  Associated nausea and vomiting, sensitivity to light.  Denies fever, head trauma, neck stiffness, "worst" headache of her life.  Denies any change from her typical migraine symptoms.  States her last migraine that was.  This bad was several years ago.  Her migraines are managed by her primary care provider.  Patient is a 36 y.o. female presenting with migraine. The history is provided by the patient.  Migraine This is a recurrent problem. The current episode started yesterday. The problem has been gradually worsening. Associated symptoms include nausea and vomiting. Pertinent negatives include no abdominal pain, change in bowel habit, chest pain, chills, fever, neck pain, numbness, urinary symptoms, visual change or weakness. Exacerbated by: light. She has tried acetaminophen for the symptoms. The treatment provided no relief.  Migraine This is a recurrent problem. The current episode started yesterday. The problem has been gradually worsening. Pertinent negatives include no chest pain, no abdominal pain and no shortness of breath. Exacerbated by: light. She has tried acetaminophen for the symptoms. The treatment provided no relief.    Past Medical History  Diagnosis Date  . Migraine     Past Surgical History  Procedure Date  . Abdominal hysterectomy     No family history on file.  History  Substance Use Topics    . Smoking status: Never Smoker   . Smokeless tobacco: Not on file  . Alcohol Use: No    OB History    Grav Para Term Preterm Abortions TAB SAB Ect Mult Living                  Review of Systems  Constitutional: Negative for fever, chills, activity change and appetite change.  HENT: Negative for neck pain and neck stiffness.   Respiratory: Negative for shortness of breath.   Cardiovascular: Negative for chest pain.  Gastrointestinal: Positive for nausea and vomiting. Negative for abdominal pain, diarrhea and change in bowel habit.  Genitourinary: Negative for dysuria, urgency and frequency.  Neurological: Negative for weakness and numbness.  Psychiatric/Behavioral: Negative for confusion.  All other systems reviewed and are negative.    Allergies  Review of patient's allergies indicates no known allergies.  Home Medications   Current Outpatient Rx  Name Route Sig Dispense Refill  . SERTRALINE HCL 50 MG PO TABS Oral Take 50 mg by mouth every morning.       BP 123/81  Pulse 107  Temp 98 F (36.7 C)  Resp 16  Wt 180 lb (81.647 kg)  SpO2 99%  Physical Exam  Nursing note and vitals reviewed. Constitutional: She is oriented to person, place, and time. She appears well-developed and well-nourished. No distress.  HENT:  Head: Normocephalic and atraumatic.  Mouth/Throat: No oropharyngeal exudate.  Neck: Normal range of motion. Neck supple.  Cardiovascular: Normal rate, regular rhythm and normal heart sounds.   Pulmonary/Chest: Effort normal and breath sounds normal. No respiratory distress. She has no  wheezes. She has no rales.  Abdominal: Soft. She exhibits no distension. There is no tenderness. There is no rebound and no guarding.  Musculoskeletal: Normal range of motion.  Neurological: She is alert and oriented to person, place, and time. She has normal strength. No cranial nerve deficit or sensory deficit. She exhibits normal muscle tone. Coordination normal. GCS eye  subscore is 4. GCS verbal subscore is 5. GCS motor subscore is 6.       CN II-XII intact, EOMs intact, no pronator drift, grip strengths equal bilaterally;strength 5/5 in all extremities, sensation is intact.    Skin: She is not diaphoretic.  Psychiatric: She has a normal mood and affect. Her behavior is normal. Thought content normal.    ED Course  Procedures (including critical care time)  Labs Reviewed - No data to display No results found.  7:05 AM Patient sleeping comfortably.  Spouse is bedside.    8:15 AM Patient is pain free.    1. Migraine headache       MDM    Nontoxic, afebrile.  Patient with history of migraines presenting with migraine that is exactly the same as her typical migraine with no change in quality of symptoms.  Patient has no neurological complaints or deficits. This is not the worst headache of her life and was not sudden in onset.  She does not have a fever, she has had no head trauma.  Patient's pain was relieved in the emergency department and she was discharged home with Primary care provider  followup.        Rise Patience, [place] 07/21/11 1404  Medical screening examination/treatment/procedure(s) were performed by non-physician practitioner and as supervising physician I was immediately available for consultation/collaboration.  Sunnie Nielsen, MD 07/22/11 [phone]

## 2011-07-21 NOTE — ED Notes (Signed)
Pt alert, presents with family, c/o migraine h/a, onset yesterday, hx of same, c/o nausea, emesis, light sensitivity, ambulates to triage, steady gait noted

## 2011-07-21 NOTE — ED Notes (Signed)
MD/PA Chad E at bedside.

## 2011-07-21 NOTE — ED Notes (Signed)
Patient is resting comfortably. 

## 2011-09-13 ENCOUNTER — Ambulatory Visit: Payer: 59 | Admitting: Family Medicine

## 2011-09-19 ENCOUNTER — Encounter: Payer: Self-pay | Admitting: Family Medicine

## 2011-09-19 ENCOUNTER — Ambulatory Visit (INDEPENDENT_AMBULATORY_CARE_PROVIDER_SITE_OTHER): Payer: 59 | Admitting: Family Medicine

## 2011-09-19 VITALS — BP 115/81 | HR 94 | Ht 61.0 in | Wt 181.0 lb

## 2011-09-19 DIAGNOSIS — G43909 Migraine, unspecified, not intractable, without status migrainosus: Secondary | ICD-10-CM

## 2011-09-19 MED ORDER — SUMATRIPTAN SUCCINATE 50 MG PO TABS: 50.0000 mg | ORAL_TABLET | ORAL | Status: DC | PRN

## 2011-09-19 NOTE — Patient Instructions (Signed)
Dear Mrs. Manson Passey,   Thank you for coming to clinic today. Please read below regarding the issues that we discussed.   Headaches - I think the first step to controlling this is to stop phentermine. This can act like a caffeine and raise your blood pressure, which may cause headaches. Also, I'm giving you a pill call imitrex to take as needed for migraines. DO NOT take for a regular headache. If you need to take this more than once every 2 weeks, then we will start a controller medication.    Please follow up with Dr. Fara Boros in 4 weeks . Please call earlier if you have any questions or concerns.   Sincerely,   Dr. Clinton Sawyer

## 2011-09-24 NOTE — Assessment & Plan Note (Signed)
Headaches likely exacerbated by use of phentermine. Patient counseled on association and has decided to quit taking it. She will now take sumatriptan for abortive therapy. She will be started on controller medication if she needs the sumatriptan > 2 x per month.

## 2011-09-24 NOTE — Progress Notes (Signed)
  Subjective:    Patient ID: Joanna [name], female    DOB: 1976/01/05, 36 y.o.   MRN: [id number]  HPI Joanna [name] presents today for counseling on headaches. She does not currently have a headache.   Headaches - Has a history of migraines that started at age 36 after delivery of her first child. She had a hiatus for several years, but they returned in February. Since then, she describes 1 severe headache a week. She believes that the triggers are caffeine and stress. She also notes that she started taking phentermine in January for weight loss.   She describes the headaches as bilateral, located mainly behind her eyes. They are always associated with nausea, vomiting, photophobia and phonophobia. For relief she went to the ED on 2/8 and received a treatment with benadryl, promethazine, ketoralac, and a fluid bolus. Since that time she has been using Excedrin migraine as needed. She tried imitrex once, but vomited shortly after taking it. She denies ever having been on a controller medication.    Review of Systems Positive see HPI  Negative for facial droop, numbness or tingling in extremities, syncope; otherwise negative     Objective:   Physical Exam BP 115/81  Pulse 94  Ht 5\' 1"  (1.549 m)  Wt 181 lb (82.101 kg)  BMI 34.20 kg/m2 Gen: alert, oriented, non-distressed HEENT: PERRLA, EOMI     Assessment & Plan:  36 year old female with poorly controlled primary headaches that have recurred recently, which is possibly related to her use of phentermine.

## 2011-11-07 ENCOUNTER — Emergency Department (HOSPITAL_COMMUNITY)
Admission: EM | Admit: 2011-11-07 | Discharge: 2011-11-07 | Disposition: A | Discharge status: Home / Self Care | Payer: 59 | Source: Home / Self Care | Attending: Family Medicine | Admitting: Family Medicine

## 2011-11-07 DIAGNOSIS — J069 Acute upper respiratory infection, unspecified: Secondary | ICD-10-CM

## 2011-11-07 MED ORDER — BENZONATATE 100 MG PO CAPS: 100.0000 mg | ORAL_CAPSULE | Freq: Three times a day (TID) | ORAL | Status: AC

## 2011-11-07 MED ORDER — IBUPROFEN 600 MG PO TABS: 600.0000 mg | ORAL_TABLET | Freq: Three times a day (TID) | ORAL | Status: AC | PRN

## 2011-11-07 MED ORDER — CETIRIZINE-PSEUDOEPHEDRINE ER 5-120 MG PO TB12: 1.0000 | ORAL_TABLET | Freq: Two times a day (BID) | ORAL | Status: DC | PRN

## 2011-11-07 MED ORDER — DEXTROMETHORPHAN-GUAIFENESIN 10-300 MG/5ML PO LIQD: 5.0000 mL | Freq: Three times a day (TID) | ORAL | Status: DC | PRN

## 2011-11-07 NOTE — ED Notes (Signed)
Pt. Stated, I've had a cough and congested since Sunday

## 2011-11-07 NOTE — Discharge Instructions (Signed)
Is likely you have a viral upper respiratory infection versus seasonal allergies. Is very important top keep well hydrated. Take the prescribed medications as instructed. Take ibuprofen scheduled for the next 24-48 hours take with food and plenty of liquids as it can upset your stomach, can alternate with Tylenol over-the-counter 500 mg every 6 hours as needed for fever or pain. Use nasal saline spray at least 3 times a day. (simply saline is over the counter) Return if late onset of fever, difficulty breathing, chest pain or not keeping fluids down.

## 2011-11-07 NOTE — ED Provider Notes (Signed)
History     CSN: [id number]  Arrival date & time 11/07/11  1654   First MD Initiated Contact with Patient 11/07/11 1706      Chief Complaint  Patient presents with  . Cough    (Consider location/radiation/quality/duration/timing/severity/associated sxs/prior treatment) HPI Comments: 36 year old female with history of migraines. Here complaining of cough and congestion for 2 days. Describes she started with scratchy throat, sneezing and clear rhinorrhea on day one followed by nonproductive persistent and recurrent cough. Cough is dry and not associated to chest pain. Denies current sore throat. No fever or chills. No headache or abdominal pain. No nausea vomiting or diarrhea. No rashes. No known sick contacts.   Past Medical History  Diagnosis Date  . Migraine     Past Surgical History  Procedure Date  . Abdominal hysterectomy     No family history on file.  History  Substance Use Topics  . Smoking status: Never Smoker   . Smokeless tobacco: Not on file  . Alcohol Use: No    OB History    Grav Para Term Preterm Abortions TAB SAB Ect Mult Living                  Review of Systems  Constitutional: Negative for fever, chills, appetite change and fatigue.  HENT: Positive for congestion, rhinorrhea and sneezing. Negative for sore throat, trouble swallowing, neck pain and sinus pressure.   Eyes: Negative for discharge.  Respiratory: Positive for cough. Negative for shortness of breath and wheezing.   Cardiovascular: Negative for chest pain, palpitations and leg swelling.  Gastrointestinal: Negative for nausea, vomiting, abdominal pain and diarrhea.  Musculoskeletal: Negative for myalgias and arthralgias.  Skin: Negative for rash.  Neurological: Negative for dizziness and headaches.    Allergies  Review of patient's allergies indicates no known allergies.  Home Medications   Current Outpatient Rx  Name Route Sig Dispense Refill  . BENZONATATE 100 MG PO CAPS Oral  Take 1 capsule (100 mg total) by mouth every 8 (eight) hours. 21 capsule 0  . CETIRIZINE-PSEUDOEPHEDRINE ER 5-120 MG PO TB12 Oral Take 1 tablet by mouth 2 (two) times daily as needed for allergies or rhinitis. 30 tablet 0  . DEXTROMETHORPHAN-GUAIFENESIN 10-300 MG/5ML PO LIQD Oral Take 5 mLs by mouth every 8 (eight) hours as needed. 1 Bottle 0  . IBUPROFEN 600 MG PO TABS Oral Take 1 tablet (600 mg total) by mouth every 8 (eight) hours as needed for pain or fever. 20 tablet 0  . SUMATRIPTAN SUCCINATE 50 MG PO TABS Oral Take 1 tablet (50 mg total) by mouth every 2 (two) hours as needed for migraine. 4 tablet 0    BP 130/86  Pulse 83  Temp(Src) 98 F (36.7 C) (Oral)  Resp 16  SpO2 99%  Physical Exam  Nursing note and vitals reviewed. Constitutional: She is oriented to person, place, and time. She appears well-developed and well-nourished. No distress.  HENT:  Head: Normocephalic and atraumatic.       Nasal Congestion with erythema and swelling of nasal turbinates, clear rhinorrhea. mild pharyngeal erythema no exudates. No uvula deviation. No trismus. TM's normal   Eyes: Conjunctivae are normal. Pupils are equal, round, and reactive to light. Right eye exhibits no discharge. Left eye exhibits no discharge. No scleral icterus.  Neck: Normal range of motion. Neck supple. No JVD present.  Cardiovascular: Normal heart sounds.   Pulmonary/Chest: Effort normal and breath sounds normal. No respiratory distress. She has no wheezes. She has  no rales. She exhibits no tenderness.  Lymphadenopathy:    She has no cervical adenopathy.  Neurological: She is alert and oriented to person, place, and time.  Skin: No rash noted.    ED Course  Procedures (including critical care time)  Labs Reviewed - No data to display No results found.   1. URI (upper respiratory infection)       MDM  Impress viral upper respiratory infection versus seasonal allergies. Treated with ibuprofen, cetirizine  pseudoephedrine, Tessalon Perles and dextromethorphan guaifenesin. Supportive care discussed and also provided in writing. Asked to return if late onset of fever and worsening symptoms like chest pain difficulty breathing or not keeping fluids down.        Sharin Grave, MD 11/08/11 1940

## 2011-11-08 NOTE — ED Notes (Signed)
Nettie Elm, pharmacist from [facility] on [address] called stating that the the rx for Dextromethorphan-guaifenesin had a dosage that was not available and wanted to know if it could be changed to 10-100/81ml.  I spoke with K. Sophia, PA and she ok'ed the change.

## 2012-03-18 ENCOUNTER — Ambulatory Visit (INDEPENDENT_AMBULATORY_CARE_PROVIDER_SITE_OTHER): Payer: 59 | Admitting: Family Medicine

## 2012-03-18 ENCOUNTER — Encounter: Payer: Self-pay | Admitting: Family Medicine

## 2012-03-18 VITALS — BP 126/78 | HR 81 | Temp 98.5°F | Ht 61.0 in | Wt 188.0 lb

## 2012-03-18 DIAGNOSIS — R52 Pain, unspecified: Secondary | ICD-10-CM

## 2012-03-18 DIAGNOSIS — IMO0001 Reserved for inherently not codable concepts without codable children: Secondary | ICD-10-CM

## 2012-03-18 DIAGNOSIS — M791 Myalgia, unspecified site: Secondary | ICD-10-CM

## 2012-03-18 MED ORDER — MELOXICAM 15 MG PO TABS: 15.0000 mg | ORAL_TABLET | Freq: Every day | ORAL | Status: DC

## 2012-03-18 NOTE — Patient Instructions (Addendum)
Dear Mrs. Manson Passey,   Thank you for coming to clinic today. Please read below regarding the issues that we discussed.   Muscle and joint pain - Let us check a few labs and go from there. Please take the meloxicam as needed. I will call you with the results.   Sincerely,   Dr. Clinton Sawyer

## 2012-03-18 NOTE — Progress Notes (Signed)
  Subjective:    Patient ID: Joanna [name], female    DOB: 03-28-1976, 36 y.o.   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 per orthopedist; no family history of similar symptoms   Review of Systems Negative unless stated in the HPI    Objective:   Physical Exam BP 126/78  Pulse 81  Temp 98.5 F (36.9 C) (Oral)  Ht 5\' 1"  (1.549 m)  Wt 188 lb (85.276 kg)  BMI 35.52 kg/m2 Gen: AAF, non ill non distressed, alert and oriented x 4, pleasant and conversant HEENT: NCAT, PERRLA, EOMI, no lymphadenopathy MSK: 5/5 strength of upper and lower extremities; no tenderness to palpation of paraspinal muscles; no effusions of knees, wrists, or IP joints Skin: no rashes       Assessment & Plan:  36 year old F with intermittent generalized pain of unknown etiology.

## 2012-03-19 DIAGNOSIS — R52 Pain, unspecified: Secondary | ICD-10-CM | POA: Insufficient documentation

## 2012-03-19 NOTE — Assessment & Plan Note (Signed)
Unknown etiology. She has negative ANA and RF in 2008. That coupled with the absence of systemic symptoms or signs on physical exam decrease the likelihood of a primary rheumatologic or paraneoplastic issue. She has a normal cervical spine X-ray from 2010, making osteoarthritis unlikely. It has similar symptoms to a primary pain syndrome such as fibromyalgia, but no consistent findings on physical exam.  - Since NSAIDS have been effective, we will try Meloxicam for 2 weeks.  - Check ESR/CRP - F/u 2 weeks.

## 2012-03-20 ENCOUNTER — Telehealth: Payer: Self-pay | Admitting: Family Medicine

## 2012-03-20 NOTE — Telephone Encounter (Signed)
Patient called to let her know that ESR/CRP are normal. She will take Meloxicam for 2 weeks and return for follow up eval.

## 2012-04-05 ENCOUNTER — Encounter: Payer: Self-pay | Admitting: Family Medicine

## 2012-04-05 ENCOUNTER — Ambulatory Visit (INDEPENDENT_AMBULATORY_CARE_PROVIDER_SITE_OTHER): Payer: 59 | Admitting: Family Medicine

## 2012-04-05 VITALS — BP 103/70 | HR 89 | Temp 98.7°F | Ht 61.0 in | Wt 193.0 lb

## 2012-04-05 DIAGNOSIS — R52 Pain, unspecified: Secondary | ICD-10-CM

## 2012-04-08 NOTE — Progress Notes (Signed)
Patient ID: Joanna [name], female   DOB: 09/04/1975, 36 y.o.   MRN: [id number]  Patient left after checking in and prior to being seen by the physician.

## 2012-06-01 ENCOUNTER — Emergency Department (HOSPITAL_COMMUNITY)
Admission: EM | Admit: 2012-06-01 | Discharge: 2012-06-01 | Disposition: A | Discharge status: Home / Self Care | Payer: 59 | Source: Home / Self Care

## 2012-06-01 ENCOUNTER — Encounter (HOSPITAL_COMMUNITY): Payer: Self-pay | Admitting: Emergency Medicine

## 2012-06-01 DIAGNOSIS — J111 Influenza due to unidentified influenza virus with other respiratory manifestations: Secondary | ICD-10-CM

## 2012-06-01 HISTORY — DX: Unspecified osteoarthritis, unspecified site: M19.90

## 2012-06-01 MED ORDER — OSELTAMIVIR PHOSPHATE 75 MG PO CAPS: 75.0000 mg | ORAL_CAPSULE | Freq: Two times a day (BID) | ORAL | Status: DC

## 2012-06-01 MED ORDER — GUAIFENESIN-CODEINE 100-10 MG/5ML PO SYRP: 5.0000 mL | ORAL_SOLUTION | Freq: Three times a day (TID) | ORAL | Status: DC | PRN

## 2012-06-01 NOTE — ED Notes (Signed)
Pt c/o cold sx x1 day... Sx include: fevers, chest congestion, headaches, dry cough, nauseas... Denies: vomiting, diarrhea... Pt is alert w/no signs of acute distress.

## 2012-06-01 NOTE — ED Provider Notes (Signed)
Joanna [name] is a 36 y.o. female who presents to Urgent Care today for flulike illness. Patient notes mild fevers and chills, body aches, headache, cough, feeling weakness starting 24 hours ago. 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  She did receive a flu shot this year.  She denies any trouble breathing. She has tried Tylenol which has helped a bit.  She feels well otherwise   PMH reviewed. Significant for migraines History  Substance Use Topics  . Smoking status: Former Games developer  . Smokeless tobacco: Not on file  . Alcohol Use: No   ROS as above Medications reviewed. No current facility-administered medications for this encounter.   Current Outpatient Prescriptions  Medication Sig Dispense Refill  . guaiFENesin-codeine (ROBITUSSIN AC) 100-10 MG/5ML syrup Take 5 mLs by mouth 3 (three) times daily as needed for cough.  120 mL  0  . oseltamivir (TAMIFLU) 75 MG capsule Take 1 capsule (75 mg total) by mouth 2 (two) times daily.  10 capsule  0  . SUMAtriptan (IMITREX) 50 MG tablet Take 1 tablet (50 mg total) by mouth every 2 (two) hours as needed for migraine.  4 tablet  0    Exam:  BP 129/85  Pulse 106  Temp 98.8 F (37.1 C) (Oral)  Resp 18  SpO2 97% Gen: Well NAD HEENT: EOMI,  MMM, normal tympanic membranes bilaterally erythematous posterior pharynx.  Lungs: CTABL Nl WOB Heart: RRR no MRG Abd: NABS, NT, ND Exts: Non edematous BL  LE, warm and well perfused.   No results found for this or any previous visit (from the past 24 hour(s)). No results found.  Assessment and Plan: 36 y.o. female with likely influenza.  Pretest probability is high enough that it is reasonable just to treat.  Plan to use Tamiflu additionally we'll prescribe some codeine cough medicine for bothersome cough.  Recommend Tylenol or ibuprofen as needed for fevers chills and other symptoms.  Discussed warning signs or symptoms. Please see discharge  instructions. Patient expresses understanding. Come back as needed.  Work note provided.     Rodolph Bong, MD 06/01/12 [phone]

## 2012-06-01 NOTE — ED Provider Notes (Signed)
Medical screening examination/treatment/procedure(s) were performed by resident physician or non-physician practitioner and as supervising physician I was immediately available for consultation/collaboration.   Barkley Bruns MD.    Linna Hoff, MD 06/01/12 [phone]

## 2012-08-04 ENCOUNTER — Emergency Department (HOSPITAL_COMMUNITY)
Admission: EM | Admit: 2012-08-04 | Discharge: 2012-08-04 | Disposition: A | Discharge status: Home / Self Care | Payer: 59 | Source: Home / Self Care | Attending: Emergency Medicine | Admitting: Emergency Medicine

## 2012-08-04 ENCOUNTER — Encounter (HOSPITAL_COMMUNITY): Payer: Self-pay | Admitting: Emergency Medicine

## 2012-08-04 DIAGNOSIS — N3000 Acute cystitis without hematuria: Secondary | ICD-10-CM

## 2012-08-04 LAB — POCT URINALYSIS DIP (DEVICE)
Bilirubin Urine: NEGATIVE
Nitrite: NEGATIVE
pH: 6 (ref 5.0–8.0)

## 2012-08-04 MED ORDER — CEPHALEXIN 500 MG PO CAPS: 500.0000 mg | ORAL_CAPSULE | Freq: Three times a day (TID) | ORAL | Status: DC

## 2012-08-04 MED ORDER — FLUCONAZOLE 150 MG PO TABS: 150.0000 mg | ORAL_TABLET | Freq: Once | ORAL | Status: DC

## 2012-08-04 NOTE — ED Provider Notes (Signed)
Chief Complaint  Patient presents with  . Hematuria    x today    History of Present Illness:   Joanna [name] is a 37 year old female who has had a one-day history of dysuria, urgency, frequency, hematuria with clots, cloudy urine, and malodorous urine. She denies any fever, chills, nausea, vomiting, lower back, or lower abdominal pain. She's had no GYN problems. She has had urinary tract infections in the past, but her last was over a year ago.  Review of Systems:  Other than noted above, the patient denies any of the following symptoms: General:  No fevers, chills, sweats, aches, or fatigue. GI:  No abdominal pain, back pain, nausea, vomiting, diarrhea, or constipation. GU:  No dysuria, frequency, urgency, hematuria, or incontinence. GYN:  No discharge, itching, vulvar pain or lesions, pelvic pain, or abnormal vaginal bleeding.  PMFSH:  Past medical history, family history, social history, meds, and allergies were reviewed.  Physical Exam:   Vital signs:  BP 118/70  Pulse 88  Temp(Src) 98.5 F (36.9 C) (Oral)  Resp 17  SpO2 100% Gen:  Alert, oriented, in no distress. Lungs:  Clear to auscultation, no wheezes, rales or rhonchi. Heart:  Regular rhythm, no gallop or murmer. Abdomen:  Flat and soft. There was slight suprapubic pain to palpation.  No guarding, or rebound.  No hepato-splenomegaly or mass.  Bowel sounds were normally active.  No hernia. Back:  No CVA tenderness.  Skin:  Clear, warm and dry.  Labs:   Results for orders placed during the hospital encounter of 08/04/12  POCT URINALYSIS DIP (DEVICE)      Result Value Range   Glucose, UA NEGATIVE  NEGATIVE mg/dL   Bilirubin Urine NEGATIVE  NEGATIVE   Ketones, ur NEGATIVE  NEGATIVE mg/dL   Specific Gravity, Urine 1.020  1.005 - 1.030   Hgb urine dipstick LARGE (*) NEGATIVE   pH 6.0  5.0 - 8.0   Protein, ur 100 (*) NEGATIVE mg/dL   Urobilinogen, UA 1.0  0.0 - 1.0 mg/dL   Nitrite NEGATIVE  NEGATIVE   Leukocytes, UA  SMALL (*) NEGATIVE     Other Labs Obtained at Urgent Care Center:  A urine culture was obtained.  Results are pending at this time and we will call about any positive results.  Assessment: The encounter diagnosis was Acute cystitis.   Plan:   1.  The following meds were prescribed:   Discharge Medication List as of 08/04/2012  3:17 PM    START taking these medications   Details  cephALEXin (KEFLEX) 500 MG capsule Take 1 capsule (500 mg total) by mouth 3 (three) times daily., Starting 08/04/2012, Until Discontinued, Normal    fluconazole (DIFLUCAN) 150 MG tablet Take 1 tablet (150 mg total) by mouth once., Starting 08/04/2012, Normal       2.  The patient was instructed in symptomatic care and handouts were given. 3.  The patient was told to return if becoming worse in any way, if no better in 3 or 4 days, and given some red flag symptoms that would indicate earlier return. 4.  The patient was told to avoid intercourse for 10 days, get extra fluids, and return for a follow up with her primary care doctor at the completion of treatment for a repeat UA and culture.     Reuben Likes, MD 08/04/12 1800

## 2012-08-04 NOTE — ED Notes (Signed)
Pt c/o blood in urine x today. Pt states for the past several weeks urine has been cloudy with a foul odor. Pt denies back or abdominal pain.

## 2012-08-10 LAB — URINE CULTURE: Colony Count: 50000

## 2012-08-11 ENCOUNTER — Telehealth (HOSPITAL_COMMUNITY): Payer: Self-pay | Admitting: Emergency Medicine

## 2012-08-11 MED ORDER — CIPROFLOXACIN HCL 500 MG PO TABS: 500.0000 mg | ORAL_TABLET | Freq: Two times a day (BID) | ORAL | Status: DC

## 2012-08-11 NOTE — ED Notes (Signed)
Her urine culture grew out enterococcus which is resistant to cefazolin, therefore the Keflex will not be sufficient to eradicate it. It is sensitive to Cipro, so we'll go ahead and send a prescription for this in to her pharmacy.  Reuben Likes, MD 08/11/12 2025

## 2012-08-11 NOTE — Telephone Encounter (Signed)
Message copied by Reuben Likes on Sun Aug 11, 2012  8:25 PM ------      Message from: Vassie Moselle      Created: Sun Aug 11, 2012  2:44 PM      Regarding: lab       Urine culture: 50,000 colonies Enterococcus.  You treated with Keflex.  It is resistant to Cefazolin.  Does she need Rx. Changed?      Vassie Moselle      08/11/2012            ----- Message -----         From: [facility] [place] Interface         Sent: 08/06/2012   2:03 AM           To: [facility] Follow Up                   ------

## 2012-08-13 ENCOUNTER — Telehealth (HOSPITAL_COMMUNITY): Payer: Self-pay | Admitting: *Deleted

## 2012-08-13 NOTE — ED Notes (Signed)
Urine culture: 50,000 colonies Enterobacter Aerogenes. Pt. treated with Keflex and it is resistant.  Message to Dr. Lorenz Coaster on 3/2 and he changed Rx. to Cipro. Instructed to tell pt. to stop Keflex and start Cipro.  I called pt. and left a message to call. Vassie Moselle 08/13/2012

## 2012-08-14 ENCOUNTER — Telehealth (HOSPITAL_COMMUNITY): Payer: Self-pay | Admitting: *Deleted

## 2012-08-16 ENCOUNTER — Telehealth (HOSPITAL_COMMUNITY): Payer: Self-pay | Admitting: *Deleted

## 2012-08-16 NOTE — ED Notes (Signed)
Pt. called back.  Pt. verified x 2 and given results. Pt. told to stop the Keflex and start the Cipro. Pt. told where the Rx. was e-prescribed. Pt. said she only has 1 pill left.  Pt. told that the bacteria was resistant to the Keflex and that is why the Rx. Had to be changed.  I told her I had been calling her since 3/4. Vassie Moselle 08/16/2012

## 2012-11-20 ENCOUNTER — Ambulatory Visit (INDEPENDENT_AMBULATORY_CARE_PROVIDER_SITE_OTHER): Payer: 59 | Admitting: Family Medicine

## 2012-11-20 ENCOUNTER — Encounter: Payer: Self-pay | Admitting: Family Medicine

## 2012-11-20 VITALS — BP 117/78 | HR 81 | Temp 98.2°F | Wt 194.0 lb

## 2012-11-20 DIAGNOSIS — M549 Dorsalgia, unspecified: Secondary | ICD-10-CM

## 2012-11-20 LAB — POCT URINALYSIS DIPSTICK
Blood, UA: NEGATIVE
Nitrite, UA: NEGATIVE
Protein, UA: NEGATIVE
Urobilinogen, UA: 0.2
pH, UA: 7.5

## 2012-11-20 NOTE — Patient Instructions (Addendum)
Follow-up with me in 1 week  Get an xray. I will call you with results within 2 days of xray.

## 2012-11-20 NOTE — Progress Notes (Signed)
  Subjective:    Patient ID: Joanna [name], female    DOB: 11/24/1975, 37 y.o.   MRN: [id number]  HPI # SDA: persistent back pain  She was diagnose with acute cystitis at Urgent Care on 02/23. She was treated with antibiotics and the pain improved (especially lower tract symptoms), however, the back pain has come back and remained for the past few weeks.  Exacerbated by: nothing Alleviated by: nothing  The pain is not affected with position changes.  She denies difficulty breathing, dysuria/urgency/freqeuncy, fevers, chills, nausea, vomiting, diarrhea, constipation, decreased oral intake, difficulty swallowing She is s/p hysterectomy (ovaries still intact) for fibroids.   Review of Systems Per HPI  Allergies, medication, past medical history reviewed.  Smoking status noted. Generalized pain, mood swings, migraines     Objective:   Physical Exam GEN: NAD; well-nourished, -appearing CV: RRR PULM: NI WOB; CTAB without w/r/r EXT: no edema ABD: soft, NT, ND, obese, NABS BACK: diffuse mild pain lumbar area bilaterally; no mid-line tenderness; no tenderness elicited (pain is not worse with palpation); no CVA tenderness; pain is not worse with ROM of back/spine (ROM intact) NEURO: moves all extremities well; strength intact upper and lower extremities  UA: normal   Urine culture 08/04/2012: 50,000 enterobacter aerogenes     Assessment & Plan:

## 2012-11-21 DIAGNOSIS — M549 Dorsalgia, unspecified: Secondary | ICD-10-CM | POA: Insufficient documentation

## 2012-11-21 DIAGNOSIS — M545 Low back pain: Secondary | ICD-10-CM | POA: Insufficient documentation

## 2012-11-21 NOTE — Assessment & Plan Note (Signed)
Etiology unclear. Diffuse, non reproducible lumbar pain pain radiating to bilaterally flanks for the past several months.  D/Dx: constipation, ovarian cysts?, fibromyalgia -Check AXR; will call with results -If negative, consider pelvic ultrasound, CMET, CBC, ESR, ruling-out STI

## 2013-01-10 ENCOUNTER — Encounter: Payer: Self-pay | Admitting: Family Medicine

## 2013-01-10 ENCOUNTER — Ambulatory Visit (INDEPENDENT_AMBULATORY_CARE_PROVIDER_SITE_OTHER): Payer: 59 | Admitting: Family Medicine

## 2013-01-10 VITALS — BP 118/81 | HR 78 | Ht 61.0 in | Wt 192.0 lb

## 2013-01-10 DIAGNOSIS — Z0000 Encounter for general adult medical examination without abnormal findings: Secondary | ICD-10-CM | POA: Insufficient documentation

## 2013-01-10 DIAGNOSIS — M549 Dorsalgia, unspecified: Secondary | ICD-10-CM

## 2013-01-10 DIAGNOSIS — K5900 Constipation, unspecified: Secondary | ICD-10-CM

## 2013-01-10 MED ORDER — POLYETHYLENE GLYCOL 3350 17 GM/SCOOP PO POWD: 17.0000 g | Freq: Two times a day (BID) | ORAL | Status: DC | PRN

## 2013-01-10 NOTE — Assessment & Plan Note (Signed)
Pt came for her pap smear, discussed that she has no need for further paps as she had a hysterectomy in 2009 for benign indications. We also discussed screening for cholesterol and diabetes which she reports having at her job and were normal. She is overweight and is planning to try to address this but has made no changes so far.

## 2013-01-10 NOTE — Patient Instructions (Signed)
Today we discussed your health maintenance and back pain.  Because of your hysterectomy, you no longer need to get pap smears done.  For your back pain and constipation, I believe these two may be linked. A trial of miralax may be very beneficial for this. Please take miralax twice daily for 1 week to clear out your bowels and see whether this helps. If you are still having pain if 2-3 weeks please come back and we can explore other possible causes.  Please continue trying to increase your activity level and decrease your calorie intake by limiting your portions, especially of sugary drinks, sweets and fatty foods.

## 2013-01-10 NOTE — Progress Notes (Signed)
  Subjective:    Patient ID: Joanna [name], female    DOB: August 07, 1975, 37 y.o.   MRN: [id number]  Gynecologic Exam Associated symptoms include abdominal pain, back pain, constipation and flank pain. Pertinent negatives include no diarrhea, dysuria, fever, frequency, hematuria, nausea, urgency or vomiting.   Back Pain: Patient presents for presents evaluation of low back problems.  Symptoms have been present for 10 months and include dull aching back pain radiating to abdomen. Initial inciting event: none. Symptoms are worst: all day. Alleviating factors identifiable by patient are none. Exacerbating factors identifiable by patient are sitting. Treatments so far initiated by patient: none Previous lower back problems: sciatica. Previous workup: sent for abdominal xray by Dr. Willaim Bane but did not go.. Previous treatments: none.  Pt reports feeling gassy pain along with her back pain which radiates around to her abdomen. She goes several days between bowel movements and reports her pain is improved after going.   Review of Systems  Constitutional: Negative for fever and unexpected weight change.  HENT: Negative.   Eyes: Negative.   Respiratory: Negative for cough, shortness of breath and wheezing.   Cardiovascular: Positive for leg swelling. Negative for chest pain and palpitations.  Gastrointestinal: Positive for abdominal pain, constipation, abdominal distention and rectal pain. Negative for nausea, vomiting, diarrhea and blood in stool.  Endocrine: Negative.   Genitourinary: Positive for flank pain. Negative for dysuria, urgency, frequency and hematuria.  Musculoskeletal: Positive for back pain. Negative for gait problem.  Skin: Negative.   Neurological: Negative for weakness and numbness.  Hematological: Negative.   Psychiatric/Behavioral: Negative.        Objective:   Physical Exam  Vitals reviewed. Constitutional: She is oriented to person, place, and time. She appears well-developed  and well-nourished. No distress.  HENT:  Head: Normocephalic and atraumatic.  Right Ear: External ear normal.  Left Ear: External ear normal.  Nose: Nose normal.  Mouth/Throat: Oropharynx is clear and moist.  Eyes: Conjunctivae are normal. Right eye exhibits no discharge. Left eye exhibits no discharge. No scleral icterus.  Cardiovascular: Normal rate, regular rhythm, normal heart sounds and intact distal pulses.   No murmur heard. Pulmonary/Chest: Effort normal and breath sounds normal. No respiratory distress. She has no wheezes.  Abdominal: Soft. Normal appearance. She exhibits no distension, no fluid wave, no ascites, no pulsatile midline mass and no mass. Bowel sounds are decreased. There is no hepatosplenomegaly. There is no tenderness. There is no rebound and no CVA tenderness. No hernia.  Diffuse fullness, not overt distention  Musculoskeletal: Normal range of motion. She exhibits no tenderness.       Lumbar back: She exhibits pain. She exhibits no tenderness, no bony tenderness, no deformity and no spasm.  Neurological: She is alert and oriented to person, place, and time. She has normal strength and normal reflexes. No sensory deficit. Gait normal.  Skin: She is not diaphoretic.          Assessment & Plan:

## 2013-01-10 NOTE — Assessment & Plan Note (Signed)
Likely constipation related Trial of miralax, return in 2-3 weeks if still having pain for further workup.

## 2013-03-06 ENCOUNTER — Emergency Department (HOSPITAL_COMMUNITY): Payer: 59

## 2013-03-06 ENCOUNTER — Emergency Department (HOSPITAL_COMMUNITY)
Admission: EM | Admit: 2013-03-06 | Discharge: 2013-03-06 | Disposition: A | Discharge status: Home / Self Care | Payer: 59 | Attending: Emergency Medicine | Admitting: Emergency Medicine

## 2013-03-06 ENCOUNTER — Encounter (HOSPITAL_COMMUNITY): Payer: Self-pay

## 2013-03-06 DIAGNOSIS — R002 Palpitations: Secondary | ICD-10-CM | POA: Insufficient documentation

## 2013-03-06 DIAGNOSIS — Z8669 Personal history of other diseases of the nervous system and sense organs: Secondary | ICD-10-CM | POA: Insufficient documentation

## 2013-03-06 DIAGNOSIS — R0602 Shortness of breath: Secondary | ICD-10-CM | POA: Insufficient documentation

## 2013-03-06 DIAGNOSIS — Z79899 Other long term (current) drug therapy: Secondary | ICD-10-CM | POA: Insufficient documentation

## 2013-03-06 DIAGNOSIS — R0789 Other chest pain: Secondary | ICD-10-CM | POA: Insufficient documentation

## 2013-03-06 DIAGNOSIS — Z8739 Personal history of other diseases of the musculoskeletal system and connective tissue: Secondary | ICD-10-CM | POA: Insufficient documentation

## 2013-03-06 DIAGNOSIS — Z3202 Encounter for pregnancy test, result negative: Secondary | ICD-10-CM | POA: Insufficient documentation

## 2013-03-06 LAB — BASIC METABOLIC PANEL
BUN: 9 mg/dL (ref 6–23)
Calcium: 8.9 mg/dL (ref 8.4–10.5)
Creatinine, Ser: 0.96 mg/dL (ref 0.50–1.10)
GFR calc Af Amer: 86 mL/min — ABNORMAL LOW (ref >90)
GFR calc non Af Amer: 74 mL/min — ABNORMAL LOW (ref >90)
Glucose, Bld: 100 mg/dL — ABNORMAL HIGH (ref 70–99)
Sodium: 137 mEq/L (ref 135–145)

## 2013-03-06 LAB — CBC
Hemoglobin: 14.1 g/dL (ref 12.0–15.0)
MCH: 29 pg (ref 26.0–34.0)
MCHC: 36.2 g/dL — ABNORMAL HIGH (ref 30.0–36.0)
MCV: 79.9 fL (ref 78.0–100.0)
RDW: 12.5 % (ref 11.5–15.5)

## 2013-03-06 LAB — POCT I-STAT TROPONIN I

## 2013-03-06 MED ORDER — LORAZEPAM 1 MG PO TABS: 1.0000 mg | ORAL_TABLET | Freq: Three times a day (TID) | ORAL | Status: DC | PRN

## 2013-03-06 MED ORDER — LORAZEPAM 1 MG PO TABS
1.0000 mg | ORAL_TABLET | Freq: Once | ORAL | Status: AC
  Administered 2013-03-06: 1 mg via ORAL
  Filled 2013-03-06: qty 1

## 2013-03-06 NOTE — ED Provider Notes (Signed)
CSN: [id number]     Arrival date & time 03/06/13  1434 History   First MD Initiated Contact with Patient 03/06/13 1510     Chief Complaint  Patient presents with  . Tachycardia  . Shortness of Breath   (Consider location/radiation/quality/duration/timing/severity/associated sxs/prior Treatment) Patient is a 37 y.o. female presenting with palpitations. The history is provided by the patient.  Palpitations Palpitations quality:  Regular Onset quality:  Gradual Duration:  2 days Timing:  Intermittent Progression:  Waxing and waning Chronicity:  Recurrent Context: anxiety and caffeine   Context: not appetite suppressants, not exercise, not hyperventilation and not nicotine   Relieved by:  Nothing Worsened by:  Caffeine, stimulants and stress Ineffective treatments:  None tried Associated symptoms: chest pain, chest pressure and shortness of breath   Associated symptoms: no back pain, no cough, no diaphoresis, no dizziness, no lower extremity edema, no nausea, no numbness, no orthopnea, no syncope, no vomiting and no weakness   Risk factors: no diabetes mellitus, no heart disease, no hx of atrial fibrillation, no hx of PE and no hyperthyroidism     Past Medical History  Diagnosis Date  . Migraine   . Arthritis    Past Surgical History  Procedure Laterality Date  . Abdominal hysterectomy    . Breast surgery     History reviewed. No pertinent family history. History  Substance Use Topics  . Smoking status: Former Games developer  . Smokeless tobacco: Not on file  . Alcohol Use: No   OB History   Grav Para Term Preterm Abortions TAB SAB Ect Mult Living                 Review of Systems  Constitutional: Negative for diaphoresis, activity change and appetite change.  HENT: Negative for congestion.   Respiratory: Positive for chest tightness and shortness of breath. Negative for cough, wheezing and stridor.   Cardiovascular: Positive for chest pain and palpitations. Negative for  orthopnea and syncope.  Gastrointestinal: Negative for nausea, vomiting, diarrhea and constipation.  Genitourinary: Negative for dysuria.  Musculoskeletal: Negative for myalgias, back pain and arthralgias.  Skin: Negative for color change, pallor, rash and wound.  Neurological: Negative for dizziness, tremors, weakness and numbness.  All other systems reviewed and are negative.    Allergies  Review of patient's allergies indicates no known allergies.  Home Medications   Current Outpatient Rx  Name  Route  Sig  Dispense  Refill  . ibuprofen (ADVIL,MOTRIN) 200 MG tablet   Oral   Take 400 mg by mouth every 6 (six) hours as needed for pain.         Marland Kitchen LORazepam (ATIVAN) 1 MG tablet   Oral   Take 1 tablet (1 mg total) by mouth every 8 (eight) hours as needed for anxiety.   5 tablet   0    BP 115/66  Pulse 72  Resp 21  Ht 5\' 1"  (1.549 m)  Wt 191 lb 3 oz (86.722 kg)  BMI 36.14 kg/m2  SpO2 100% Physical Exam  Constitutional: She is oriented to person, place, and time. She appears well-developed and well-nourished. No distress.  Well appearing, no distress, speaks in complete sentences, pleasant  HENT:  Head: Normocephalic and atraumatic.  Mouth/Throat: No oropharyngeal exudate.  Eyes: Conjunctivae and EOM are normal. Pupils are equal, round, and reactive to light.  Neck: Normal range of motion. Neck supple.  Cardiovascular: Normal rate, regular rhythm and normal heart sounds.  Exam reveals no gallop and no friction  rub.   No murmur heard. Pulses:      Radial pulses are 2+ on the right side, and 2+ on the left side.  Pulmonary/Chest: Effort normal and breath sounds normal. No respiratory distress. She has no wheezes. She has no rales.  Abdominal: Soft. She exhibits no distension and no mass. There is no tenderness. There is no rebound and no guarding.  Musculoskeletal: Normal range of motion. She exhibits no edema and no tenderness.  Lymphadenopathy:    She has no cervical  adenopathy.  Neurological: She is alert and oriented to person, place, and time.  Skin: Skin is warm and dry. No rash noted. She is not diaphoretic.  Psychiatric: She has a normal mood and affect. Her behavior is normal. Judgment and thought content normal.    ED Course  Procedures (including critical care time) Labs Review Labs Reviewed  CBC - Abnormal; Notable for the following:    MCHC 36.2 (*)    All other components within normal limits  BASIC METABOLIC PANEL - Abnormal; Notable for the following:    Potassium 3.4 (*)    Glucose, Bld 100 (*)    GFR calc non Af Amer 74 (*)    GFR calc Af Amer 86 (*)    All other components within normal limits  TSH  POCT I-STAT TROPONIN I  POCT PREGNANCY, URINE   Imaging Review Dg Chest 2 View  03/06/2013   CLINICAL DATA:  tachycardia and shortness of breath  EXAM: CHEST  2 VIEW  COMPARISON:  Chest radiograph July 15, 2008 and chest CT July 15, 2008  FINDINGS: The lungs are clear. Heart size and pulmonary vascularity are normal. No adenopathy. No bone lesions.  IMPRESSION: No abnormality noted.   Electronically Signed   By: Bretta Bang   On: 03/06/2013 16:35    MDM   1. Palpitations      Joanna [name] is a 37 y.o. female who presents with palpitations. Has had two episodes over the past two days for which she has an extra sensation of feeling her heart beat in her chest as well as in her ears. Associated chest pressure and dyspnea. Does admit to having had these previously which have been attributed to stress and anxiety and relieved with an unknown medication. Admits to extra stress with job, family, finances today. Has never had a cardiac workup. Denies exertional component.  On arrival, HDS and not tachycardic. Still endorses palpitations on exam with HR of 70s. Differential includes anxiety, PVCs, arrhythmia, electrolyte abnormality, hyperthyroid. Feel ACS, PE, dissection unlikely. CBC, CMP, troponin sent, TSH. Will also get  CXR. Will give 1 mg ativan PO for anxiety.  Initial trop negative and CBC without leukocytosis or anemia. EKG reassuring as below. CMP with unremarkable lytes and no etiology for arrhythmias or palpitations. TSH pending at time of discharge. PCP to follow up. CXR shows no consolidation, effusion, pneumothorax, or widened mediastinum. Exam remains reassuring and patient shows no signs of tachycardia or arrythmia during ED stay. Feels markedly improved with ativan. Will d/c home with PCP follow up as well as referral to Cardiology. Small number of ativan given at d/c for recurrent palpitations and anxiety. Stable for d/c.   Date: 03/06/2013  Rate: 74  Rhythm: normal sinus rhythm  QRS Axis: normal  Intervals: normal  ST/T Wave abnormalities: normal  Conduction Disutrbances:none  Narrative Interpretation:   Old EKG Reviewed: none available  Discussed with the patient return precautions and need for follow up with  Family Practice and Cardiology. Patient voiced understanding. Stable for d/c. This patient was discussed with my attending, Dr. Deretha Emory.   Dorna Leitz, MD 03/06/13 1911

## 2013-03-06 NOTE — ED Notes (Signed)
Pt reports HOT spell yesterday

## 2013-03-06 NOTE — ED Notes (Signed)
Pt reports rapid heart beat and sob, on and off since yesterday

## 2013-03-06 NOTE — ED Provider Notes (Signed)
I saw and evaluated the patient, reviewed the resident's note and I agree with the findings and plan.  Patient seen by me. Patient with complaint of rapid heartbeat with some mild shortness of breath overnight. All symptoms have resolved now. Patient's been having symptoms similar to this the not too infrequently over the last several weeks. Patient is followed by [facility]. Cardiac monitoring here did not show any arrhythmias. EKG reviewed by me and I agree with the resident's interpretation. Patient's lab workup if normal to include chest x-ray patient will be discharged home followup with [facility] also referral to cardiology. TSH was ordered but probably will not be back during her visit her and emergency apartment. They can be followed up by cardiology and or family practice.  Results for orders placed during the hospital encounter of 03/06/13  CBC      Result Value Range   WBC 5.9  4.0 - 10.5 K/uL   RBC 4.87  3.87 - 5.11 MIL/uL   Hemoglobin 14.1  12.0 - 15.0 g/dL   HCT 16.1  09.6 - 04.5 %   MCV 79.9  78.0 - 100.0 fL   MCH 29.0  26.0 - 34.0 pg   MCHC 36.2 (*) 30.0 - 36.0 g/dL   RDW 40.9  81.1 - 91.4 %   Platelets 318  150 - 400 K/uL  BASIC METABOLIC PANEL      Result Value Range   Sodium 137  135 - 145 mEq/L   Potassium 3.4 (*) 3.5 - 5.1 mEq/L   Chloride 101  96 - 112 mEq/L   CO2 26  19 - 32 mEq/L   Glucose, Bld 100 (*) 70 - 99 mg/dL   BUN 9  6 - 23 mg/dL   Creatinine, Ser 7.82  0.50 - 1.10 mg/dL   Calcium 8.9  8.4 - 95.6 mg/dL   GFR calc non Af Amer 74 (*) >90 mL/min   GFR calc Af Amer 86 (*) >90 mL/min  POCT I-STAT TROPONIN I      Result Value Range   Troponin i, poc 0.00  0.00 - 0.08 ng/mL   Comment 3           POCT PREGNANCY, URINE      Result Value Range   Preg Test, Ur NEGATIVE  NEGATIVE     Shelda Jakes, MD 03/06/13 [phone]

## 2013-03-06 NOTE — ED Notes (Signed)
Patient transported to X-ray 

## 2013-07-17 ENCOUNTER — Encounter (HOSPITAL_COMMUNITY): Payer: Self-pay | Admitting: Emergency Medicine

## 2013-07-17 ENCOUNTER — Emergency Department (HOSPITAL_COMMUNITY)
Admission: EM | Admit: 2013-07-17 | Discharge: 2013-07-17 | Disposition: A | Discharge status: Home / Self Care | Payer: Managed Care, Other (non HMO) | Source: Home / Self Care | Attending: Family Medicine | Admitting: Family Medicine

## 2013-07-17 DIAGNOSIS — J329 Chronic sinusitis, unspecified: Secondary | ICD-10-CM

## 2013-07-17 MED ORDER — FLUTICASONE PROPIONATE 50 MCG/ACT NA SUSP: 1.0000 | Freq: Every day | NASAL | Status: DC

## 2013-07-17 MED ORDER — AZITHROMYCIN 250 MG PO TABS: 250.0000 mg | ORAL_TABLET | Freq: Every day | ORAL | Status: DC

## 2013-07-17 MED ORDER — PREDNISONE 10 MG PO TABS
30.0000 mg | ORAL_TABLET | Freq: Every day | ORAL | Status: DC
Start: 2013-07-17 — End: 2013-08-27

## 2013-07-17 NOTE — ED Notes (Signed)
C/o sinus States head feels as if it has pressure in it States ears feel full Has had these sx on and off since Nov.

## 2013-07-17 NOTE — ED Provider Notes (Signed)
Joanna [name] is a 38 y.o. female who presents to Urgent Care today for sinus pain and pressure with mild nasal discharge present off and on since November. Symptoms worsened again Tuesday. She has not tried any medications yet. No fevers or chills nausea vomiting or diarrhea. Symptoms are moderate.  Patient works at a daycare facility. Past Medical History  Diagnosis Date  . Migraine   . Arthritis    History  Substance Use Topics  . Smoking status: Former Games developermoker  . Smokeless tobacco: Not on file  . Alcohol Use: No   ROS as above Medications: No current facility-administered medications for this encounter.   Current Outpatient Prescriptions  Medication Sig Dispense Refill  . azithromycin (ZITHROMAX) 250 MG tablet Take 1 tablet (250 mg total) by mouth daily. Take first 2 tablets together, then 1 every day until finished.  6 tablet  0  . fluticasone (FLONASE) 50 MCG/ACT nasal spray Place 1 spray into both nostrils daily.  16 g  2  . ibuprofen (ADVIL,MOTRIN) 200 MG tablet Take 400 mg by mouth every 6 (six) hours as needed for pain.      Marland Kitchen. LORazepam (ATIVAN) 1 MG tablet Take 1 tablet (1 mg total) by mouth every 8 (eight) hours as needed for anxiety.  5 tablet  0  . predniSONE (DELTASONE) 10 MG tablet Take 3 tablets (30 mg total) by mouth daily.  15 tablet  0    Exam:  BP 118/58  Pulse 77  Temp(Src) 98.2 F (36.8 C) (Oral)  Resp 16  SpO2 100% Gen: Well NAD HEENT: EOMI,  MMM is a turbinates are inflamed bilaterally. Tympanic membranes are normal appearing bilaterally. Posterior pharynx with cobblestoning. Nontender maxillary sinuses Lungs: Normal work of breathing. CTABL Heart: RRR no MRG Abd: NABS, Soft. NT, ND Exts: Brisk capillary refill, warm and well perfused.   No results found for this or any previous visit (from the past 24 hour(s)). No results found.  Assessment and Plan: 38 y.o. female with sinusitis. Plan to treat with prednisone and azithromycin and Flonase nasal  spray.  Discussed warning signs or symptoms. Please see discharge instructions. Patient expresses understanding.    Rodolph BongEvan S Jayen Bromwell, MD 07/17/13 [phone]

## 2013-07-17 NOTE — Discharge Instructions (Signed)
Thank you for coming in today. Take prednisone and azithromycin daily for 5 days. Use Flonase nasal spray. Call or go to the emergency room if you get worse, have trouble breathing, have chest pains, or palpitations.   Sinusitis Sinusitis is redness, soreness, and swelling (inflammation) of the paranasal sinuses. Paranasal sinuses are air pockets within the bones of your face (beneath the eyes, the middle of the forehead, or above the eyes). In healthy paranasal sinuses, mucus is able to drain out, and air is able to circulate through them by way of your nose. However, when your paranasal sinuses are inflamed, mucus and air can become trapped. This can allow bacteria and other germs to grow and cause infection. Sinusitis can develop quickly and last only a short time (acute) or continue over a long period (chronic). Sinusitis that lasts for more than 12 weeks is considered chronic.  CAUSES  Causes of sinusitis include:  Allergies.  Structural abnormalities, such as displacement of the cartilage that separates your nostrils (deviated septum), which can decrease the air flow through your nose and sinuses and affect sinus drainage.  Functional abnormalities, such as when the small hairs (cilia) that line your sinuses and help remove mucus do not work properly or are not present. SYMPTOMS  Symptoms of acute and chronic sinusitis are the same. The primary symptoms are pain and pressure around the affected sinuses. Other symptoms include:  Upper toothache.  Earache.  Headache.  Bad breath.  Decreased sense of smell and taste.  A cough, which worsens when you are lying flat.  Fatigue.  Fever.  Thick drainage from your nose, which often is green and may contain pus (purulent).  Swelling and warmth over the affected sinuses. DIAGNOSIS  Your caregiver will perform a physical exam. During the exam, your caregiver may:  Look in your nose for signs of abnormal growths in your nostrils  (nasal polyps).  Tap over the affected sinus to check for signs of infection.  View the inside of your sinuses (endoscopy) with a special imaging device with a light attached (endoscope), which is inserted into your sinuses. If your caregiver suspects that you have chronic sinusitis, one or more of the following tests may be recommended:  Allergy tests.  Nasal culture A sample of mucus is taken from your nose and sent to a lab and screened for bacteria.  Nasal cytology A sample of mucus is taken from your nose and examined by your caregiver to determine if your sinusitis is related to an allergy. TREATMENT  Most cases of acute sinusitis are related to a viral infection and will resolve on their own within 10 days. Sometimes medicines are prescribed to help relieve symptoms (pain medicine, decongestants, nasal steroid sprays, or saline sprays).  However, for sinusitis related to a bacterial infection, your caregiver will prescribe antibiotic medicines. These are medicines that will help kill the bacteria causing the infection.  Rarely, sinusitis is caused by a fungal infection. In theses cases, your caregiver will prescribe antifungal medicine. For some cases of chronic sinusitis, surgery is needed. Generally, these are cases in which sinusitis recurs more than 3 times per year, despite other treatments. HOME CARE INSTRUCTIONS   Drink plenty of water. Water helps thin the mucus so your sinuses can drain more easily.  Use a humidifier.  Inhale steam 3 to 4 times a day (for example, sit in the bathroom with the shower running).  Apply a warm, moist washcloth to your face 3 to 4 times a  day, or as directed by your caregiver.  Use saline nasal sprays to help moisten and clean your sinuses.  Take over-the-counter or prescription medicines for pain, discomfort, or fever only as directed by your caregiver. SEEK IMMEDIATE MEDICAL CARE IF:  You have increasing pain or severe headaches.  You  have nausea, vomiting, or drowsiness.  You have swelling around your face.  You have vision problems.  You have a stiff neck.  You have difficulty breathing. MAKE SURE YOU:   Understand these instructions.  Will watch your condition.  Will get help right away if you are not doing well or get worse. Document Released: 05/29/2005 Document Revised: 08/21/2011 Document Reviewed: 06/13/2011 [facility] Patient Information 2014 [place], [place].

## 2013-08-27 ENCOUNTER — Ambulatory Visit (INDEPENDENT_AMBULATORY_CARE_PROVIDER_SITE_OTHER): Payer: Managed Care, Other (non HMO) | Admitting: Family Medicine

## 2013-08-27 ENCOUNTER — Encounter: Payer: Self-pay | Admitting: Family Medicine

## 2013-08-27 VITALS — BP 117/79 | HR 85 | Temp 98.5°F | Ht 61.0 in | Wt 194.0 lb

## 2013-08-27 DIAGNOSIS — R52 Pain, unspecified: Secondary | ICD-10-CM

## 2013-08-27 DIAGNOSIS — M2560 Stiffness of unspecified joint, not elsewhere classified: Secondary | ICD-10-CM

## 2013-08-27 LAB — CBC
HEMATOCRIT: 38.1 % (ref 36.0–46.0)
HEMOGLOBIN: 13.3 g/dL (ref 12.0–15.0)
MCH: 27.8 pg (ref 26.0–34.0)
MCHC: 34.9 g/dL (ref 30.0–36.0)
MCV: 79.5 fL (ref 78.0–100.0)
Platelets: 343 10*3/uL (ref 150–400)
RBC: 4.79 MIL/uL (ref 3.87–5.11)
RDW: 13.8 % (ref 11.5–15.5)
WBC: 6.9 10*3/uL (ref 4.0–10.5)

## 2013-08-27 LAB — BASIC METABOLIC PANEL
BUN: 7 mg/dL (ref 6–23)
CO2: 29 meq/L (ref 19–32)
CREATININE: 0.64 mg/dL (ref 0.50–1.10)
Calcium: 8.6 mg/dL (ref 8.4–10.5)
Chloride: 106 mEq/L (ref 96–112)
Glucose, Bld: 91 mg/dL (ref 70–99)
Potassium: 3.7 mEq/L (ref 3.5–5.3)
SODIUM: 140 meq/L (ref 135–145)

## 2013-08-27 LAB — C-REACTIVE PROTEIN: CRP: <0.5 mg/dL (ref <0.60)

## 2013-08-27 LAB — RHEUMATOID FACTOR: Rhuematoid fact SerPl-aCnc: <10 IU/mL (ref <=14)

## 2013-08-27 LAB — POCT SEDIMENTATION RATE: POCT SED RATE: 5 mm/hr (ref 0–22)

## 2013-08-27 MED ORDER — NORTRIPTYLINE HCL 10 MG PO CAPS: 10.0000 mg | ORAL_CAPSULE | Freq: Every day | ORAL | Status: DC

## 2013-08-27 NOTE — Patient Instructions (Signed)
We will start the nortriptiline at a low dose. There is room to go up on the medicine so don't get discouraged if it doesn't work right away.   Follow up with your primary care doctor in 10 days to possibly increase medicine.   The clinic will call you with the results.

## 2013-08-28 ENCOUNTER — Encounter: Payer: Self-pay | Admitting: Family Medicine

## 2013-08-28 ENCOUNTER — Telehealth: Payer: Self-pay | Admitting: Family Medicine

## 2013-08-28 LAB — CYCLIC CITRUL PEPTIDE ANTIBODY, IGG: Cyclic Citrullin Peptide Ab: <2 U/mL (ref 0.0–5.0)

## 2013-08-28 LAB — ANA: Anti Nuclear Antibody(ANA): NEGATIVE

## 2013-08-28 LAB — TSH: TSH: 1.264 u[IU]/mL (ref 0.350–4.500)

## 2013-08-28 NOTE — Telephone Encounter (Signed)
Called patient and let her know of her normal results.   Joanna ChancyStephanie Terecia Plaut, PGY-3 Family Medicine Resident

## 2013-08-28 NOTE — Progress Notes (Signed)
Patient ID: Zack SealKimberly K Houde    DOB: May 24, 1976, 38 y.o.   MRN: [id number] --- Subjective:  Cala BradfordKimberly is a 38 y.o.female with h/o migraines who presents with generalized pain.  - generalized body pain: started 5 years ago but has progressively become worst. Pain is located in the lower back, hips, knees, hands. Pain is achy and stiff. She sometimes has difficulty using her hands due to stiffness in the joints. She reports swelling of the knees and fingers. She has morning stiffness that lasts 30 min to 1hr.  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  She denies any trauma 5 years ago that initiated this.  2 weeks ago she had a low back muscle spasm on left side which made it difficult to function. No sharp shooting pain down leg. No urine or bowel incontinence.   ROS: see HPI Past Medical History: reviewed and updated medications and allergies. Social History: Tobacco: former smoker  Objective: Filed Vitals:   08/27/13 1022  BP: 117/79  Pulse: 85  Temp: 98.5 F (36.9 C)    Physical Examination:   General appearance - alert, well appearing, and in no distress Neuro - CN-2-12 grossly intact, normal grips strength, normal biceps and triceps strength, normal deltoid.  5/5 strength with knee flexion, extension, foot dorsiflexion and plantarflexion and hip flexion.  MSK - mild tenderness with palpation of left paralumbar muscle.  Point tenderness on bilateral superior anterior chest above clavicles. Point tenderness in medial aspect of knees bilaterally Normal range of motion of knee, no joint effusion No significant hand or finger swelling. Normal PIP and DIP flexion strength bilaterally

## 2013-08-29 NOTE — Assessment & Plan Note (Signed)
Unclear etiology. Reviewed note from 03/19/2012 when she was evaluated for similar symptoms.  Since patient has worsening symptoms and does report morning stiffness greater than 22minutes, will get full panel for rheumatologic disease (last ANA and RF were in 2008).  Exam not consistent with fibromyalgia either since only 5 anatomic points were painful. Depression may be playing a role although phq9 only pointed to mild depression with score of 6.  - check RF, ANA, CCP, ESR, CRP, TSH, CBC and BMP - will empirically try low dose nortriptyline 10mg  to be titrated up.  - follow up in 10 days for up titration of nortriptyline.  - If not better with this management consider referral to rheumatologist.

## 2013-09-08 ENCOUNTER — Telehealth: Payer: Self-pay | Admitting: Family Medicine

## 2013-09-08 DIAGNOSIS — R52 Pain, unspecified: Secondary | ICD-10-CM

## 2013-09-08 MED ORDER — IBUPROFEN 600 MG PO TABS: 600.0000 mg | ORAL_TABLET | Freq: Four times a day (QID) | ORAL | Status: DC | PRN

## 2013-09-08 MED ORDER — NORTRIPTYLINE HCL 25 MG PO CAPS: 25.0000 mg | ORAL_CAPSULE | Freq: Every day | ORAL | Status: DC

## 2013-09-08 NOTE — Telephone Encounter (Signed)
Called patient back. She tells me she had improvement of pain for 2-3 days after starting nortriptiline 10mg  daily. No excessive drowsiness. She also continues to have left thigh and lateral hip pain.  - will increase nortriptyline to 25mg  qhs - ibuprofen 600mg  q6/prn - recommended that she follow up with me sooner than May 8th since we need to tritrate up med dose.  She agreed to plan.   Marena ChancyStephanie Yaser Harvill, PGY-3 Family Medicine Resident

## 2013-09-08 NOTE — Telephone Encounter (Signed)
Pt called to let Dr. Gwenlyn SaranLosq know that the medication she is taking is not helping and was told to call back and let the doctor know and that we could adjust the medication to a higher dosage. jw

## 2013-09-08 NOTE — Telephone Encounter (Signed)
Will fwd to Dr.Losq for review. Joanna [name].Joanna [name], [name]

## 2013-09-18 ENCOUNTER — Ambulatory Visit (INDEPENDENT_AMBULATORY_CARE_PROVIDER_SITE_OTHER): Payer: Managed Care, Other (non HMO) | Admitting: Family Medicine

## 2013-09-18 ENCOUNTER — Encounter: Payer: Self-pay | Admitting: Family Medicine

## 2013-09-18 VITALS — BP 102/74 | Temp 98.4°F | Ht 61.0 in | Wt 193.0 lb

## 2013-09-18 DIAGNOSIS — M25559 Pain in unspecified hip: Secondary | ICD-10-CM | POA: Insufficient documentation

## 2013-09-18 DIAGNOSIS — R52 Pain, unspecified: Secondary | ICD-10-CM

## 2013-09-18 DIAGNOSIS — M2550 Pain in unspecified joint: Secondary | ICD-10-CM

## 2013-09-18 MED ORDER — GABAPENTIN 300 MG PO CAPS: 300.0000 mg | ORAL_CAPSULE | Freq: Two times a day (BID) | ORAL | Status: DC

## 2013-09-18 NOTE — Assessment & Plan Note (Signed)
Possibly torn or pulled hamstring.  Since this has been ongoing for over a month, will refer her to sports medicine for possible diagnostic US and recommendations for exercises and treatment.  - continue NSAIDs and gave patient some basic exercises to do at home.

## 2013-09-18 NOTE — Patient Instructions (Signed)
Start taking the gabapentin at night then increase to twice a day if you are tolerating it. Follow up in 1 month.   I am also referring you to sports medicine for a possible hamstring injury.

## 2013-09-18 NOTE — Progress Notes (Signed)
Patient ID: Joanna [name]    DOB: 13-Mar-1976, 38 y.o.   MRN: [id number] --- Subjective:  Joanna [name] is a 38 y.o.female who presents for follow up of generalized joint pain.  - generalized joint and body pain: 5 years in duration, progressively worst. Pain continues to be located in lower back, hips,knees and hands.  She was started on nortriptyline 10mg  daily which helped mildly with the pain and she did not experience many side effects. We increased it to 25mg  daily. 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 She last took it on Sunday night.  She continues to have decreased strength in her grip and difficulty holding things.   - left sided posterior thigh pain: started over a month ago. Not getting any better. Worst with laying down flat on bed, bending forward. Pain starts at the posterior aspect of the thigh and travels past the knee. She denies any lower ext weakness, any urine or bowel incontinence.   ROS: see HPI Past Medical History: reviewed and updated medications and allergies. Social History: Tobacco: none  Objective: Filed Vitals:   09/18/13 0938  BP: 102/74  Temp: 98.4 F (36.9 C)    Physical Examination:   General appearance - alert, well appearing, and in no distress Low back - no tenderness to palpation along spine or other paraspinal muscles. 5/5 strength with knee extension, flexion, foot dorsiflexion and plantarflexion b/l.  Pain with external rotation of hip bilaterally.  Decreased hip abduction strength bilaterally

## 2013-09-18 NOTE — Assessment & Plan Note (Addendum)
Unfortunately patient did not tolerate nortriptiline despite it providing some symptomatic relief. - Will try gabapentin - etiology unclear at this time. Labs obtained including RF, anti-ccp, ANA are all normal. Patient is concerned that this could be MS. Given constellation of symptoms including pain, some mild decrease in vision and some decreased strength, it remains in the differential.  - if not better with gabapentin, will consider rheumatology referral.

## 2013-10-01 ENCOUNTER — Ambulatory Visit (INDEPENDENT_AMBULATORY_CARE_PROVIDER_SITE_OTHER): Payer: Managed Care, Other (non HMO) | Admitting: Sports Medicine

## 2013-10-01 ENCOUNTER — Encounter: Payer: Self-pay | Admitting: Sports Medicine

## 2013-10-01 ENCOUNTER — Ambulatory Visit
Admission: RE | Admit: 2013-10-01 | Discharge: 2013-10-01 | Disposition: A | Discharge status: Home / Self Care | Payer: 59 | Source: Ambulatory Visit | Attending: Sports Medicine | Admitting: Sports Medicine

## 2013-10-01 VITALS — BP 116/81 | HR 89 | Ht 61.0 in | Wt 193.0 lb

## 2013-10-01 DIAGNOSIS — M545 Low back pain, unspecified: Secondary | ICD-10-CM

## 2013-10-01 MED ORDER — MELOXICAM 15 MG PO TABS: 15.0000 mg | ORAL_TABLET | Freq: Every day | ORAL | Status: DC

## 2013-10-01 MED ORDER — PREDNISONE (PAK) 10 MG PO TABS: ORAL_TABLET | Freq: Every day | ORAL | Status: DC

## 2013-10-01 NOTE — Patient Instructions (Signed)
Go get the x-rays of your lumbar spine today  We will schedule an MRI of your lumbar spine as well  Start the steroid dose pack today and take it for the next 6 days as directed  After the Dosepak is done, start your meloxicam. Take it daily with food.  Continue your Neurontin at night. You can increase it to 600 mg if needed.  I will call you after I review your MRI. You will likely need physical therapy and possibly a lumbar ESI (steroid shot)

## 2013-10-01 NOTE — Progress Notes (Addendum)
Subjective:    Patient ID: Joanna SealKimberly K Fierro, female    DOB: November 24, 1975, 38 y.o.   MRN: [id number]  HPI chief complaint: Left leg pain  Very pleasant 38 year old female comes in today complaining of about 6 weeks of low back and left leg pain. Symptoms began with some back spasms which she experienced at work. This was followed shortly thereafter by radiating pain into the left buttock down the left hamstring and into the left calf. She describes the pain as aching in quality and constantly present although she does note that her pain is worse with lumbar flexion and prolonged sitting. She gets occasional numbness and tingling down the leg as well. She denies any groin pain. She is beginning to experience left leg weakness. In fact, she tells me that she fell this morning after her left leg gave way. She has had back spasms in the past but denies any previous radiculopathy. She has pain and swelling in many of her joints and this is currently being worked up by her primary care physician but she states that the pain that she is experiencing in her low back and down her left leg is different in nature than the pain she has experienced previously with her other joints. No prior low back surgeries. No change in bowel or bladder. She has been placed on Neurontin 300 mg twice a day. She is tolerating the nighttime dose but notices that the daytime dose makes her sleepy. She has also been given ibuprofen which has not been helpful. She denies any sudden increase in activity.  Past medical history reviewed Medications reviewed No known drug allergies    Review of Systems As above    Objective:   Physical Exam Well-developed, well-nourished. No acute distress. Awake alert and oriented x3. Vital signs reviewed.  Lumbar spine: Pain with forward flexion. No pain with extension. No tenderness to palpation along the lumbar midline. There is diffuse tenderness to palpation along the left paraspinal  musculature. Also some tenderness to palpation in the left sciatic notch.  Left hip: Smooth painless hip range of motion with a negative log roll. No tenderness over the greater trochanteric bursa. There is diffuse tenderness to palpation along the proximal hamstring but no palpable defect or soft tissue swelling.  Neurological exam: Positive straight leg raise on the left, negative on the right. 4/5 strength with resisted plantar flexion on the left compared to the right. Remainder of her strength is 5/5 bilaterally. Trace-1+/5 Achilles reflex on the left compared to 2/4 on the right. Sensation is intact to light touch. No atrophy. Patient demonstrates some mild weakness with ambulating on her tiptoes.        Assessment & Plan:  Low back pain and left leg pain likely secondary to lumbar radiculopathy  Patient's symptoms have been present now for about 6 weeks. Patient feels like her symptoms are worsening and she is now experiencing weakness in the leg which has caused her to fall. I think we need to work this up fully with plain x-rays and an MRI. I will start her on a 6 day Sterapred Dosepak to take as directed and she will follow this up with meloxicam. She will continue with her Neurontin at night and she can increase the dose to 600 mg if she can tolerate it. I will call her after I review her MRI. We will delineate further treatment at that time. In the meantime, I am not going to place any specific work restrictions  on her but I can change that at a later date if needed.  Addendum: X-rays including AP and lateral views of the lumbar spine are reviewed. There is some mild disc space narrowing at L5-S1. Otherwise unremarkable.

## 2013-10-06 ENCOUNTER — Encounter: Payer: Self-pay | Admitting: *Deleted

## 2013-10-06 MED ORDER — DIAZEPAM 10 MG PO TABS: ORAL_TABLET | ORAL | Status: DC

## 2013-10-07 ENCOUNTER — Other Ambulatory Visit: Payer: Self-pay | Admitting: *Deleted

## 2013-10-07 MED ORDER — DIAZEPAM 10 MG PO TABS: ORAL_TABLET | ORAL | Status: DC

## 2013-10-09 ENCOUNTER — Ambulatory Visit
Admission: RE | Admit: 2013-10-09 | Discharge: 2013-10-09 | Disposition: A | Discharge status: Home / Self Care | Payer: 59 | Source: Ambulatory Visit | Attending: Sports Medicine | Admitting: Sports Medicine

## 2013-10-09 DIAGNOSIS — M545 Low back pain, unspecified: Secondary | ICD-10-CM

## 2013-10-10 ENCOUNTER — Telehealth: Payer: Self-pay | Admitting: Sports Medicine

## 2013-10-10 ENCOUNTER — Encounter: Payer: Self-pay | Admitting: *Deleted

## 2013-10-10 NOTE — Telephone Encounter (Signed)
I spoke with the patient on the phone today regarding MRI findings of her lumbar spine. MRI shows a moderate sized central and left paracentral disc protrusion at L5-S1. Remainder of her lumbar spine is fairly unremarkable. She continues to get pain and numbness in the S1 distribution down the left leg. She continues with weakness as well. Her symptoms have been present now for about 8 weeks. Unfortunately, Sterapred Dosepak and NSAIDs have not been very effective. Although I believe that conservative treatment will continue likely in the form of an epidural injection and physical therapy, I would like to refer the patient to Dr. Yevette Edwardsumonski for his opinion. If her weakness persists and she continues to fail conservative treatment then she may need a microdiscectomy at L5-S1. Further workup and treatment will be deferred to Dr Yevette Edwardsumonski.

## 2013-10-10 NOTE — Patient Instructions (Signed)
DR Jaye [facility]UMONSKI [facility] [address],  [place], [place] [address] Friday May 8th 2pm [phone] PT NEEDS TO BRING HER IMAGES TO THE APPT

## 2013-10-17 ENCOUNTER — Ambulatory Visit: Payer: Managed Care, Other (non HMO) | Admitting: Family Medicine

## 2013-10-17 ENCOUNTER — Ambulatory Visit (INDEPENDENT_AMBULATORY_CARE_PROVIDER_SITE_OTHER): Payer: 59 | Admitting: Family Medicine

## 2013-10-17 VITALS — BP 120/80 | HR 80 | Temp 98.2°F | Wt 193.2 lb

## 2013-10-17 DIAGNOSIS — R52 Pain, unspecified: Secondary | ICD-10-CM

## 2013-10-17 DIAGNOSIS — R209 Unspecified disturbances of skin sensation: Secondary | ICD-10-CM

## 2013-10-17 DIAGNOSIS — R200 Anesthesia of skin: Secondary | ICD-10-CM

## 2013-10-17 DIAGNOSIS — G894 Chronic pain syndrome: Secondary | ICD-10-CM

## 2013-10-17 MED ORDER — GABAPENTIN 100 MG PO CAPS: 100.0000 mg | ORAL_CAPSULE | Freq: Every day | ORAL | Status: DC

## 2013-10-17 NOTE — Patient Instructions (Signed)
Dear Ms. Manson PasseyBrown,   Thank you for coming to clinic today. Please read below regarding the issues that we discussed.   Generalized Pain - We can say that this is not caused by an autoimmune disorder. I also do not think that it is related to this sickle cell trait. I believe that you have a chronic pain syndrome. This can be due to oversensitivity both nerves the conduct pain to the brain. The most important things for combating this pain on a regular exercise, good sleep, and medications. Please use the gabapentin, 2 tablets or 600 mg, at night. Also, like you to start taking one tablet or 100 mg during the day. I think this will improve your symptoms.  Please follow up in clinic in 1 month. Please call earlier if you have any questions or concerns.   Sincerely,   Dr. Clinton SawyerWilliamson

## 2013-10-17 NOTE — Assessment & Plan Note (Signed)
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

## 2013-10-17 NOTE — Progress Notes (Signed)
   Subjective:    Patient ID: Joanna [name], female    DOB: 07/29/1975, 38 y.o.   MRN: [id number]  HPI  38 year old F who presents for evaluation of generalized pain.  She has been evaluated by Dr. Gwenlyn SaranLosq for her generalized pain.  In March 2015 Dr. Was quartered following labs including TSH, CRP, sedimentation rate, ANA, anti-CCP, and rheumatoid factor. All of these were normal. The patient was prescribed gabapentin 300 mg twice a day for presumed neuropathic pain. The patient states that her pain is persistent. It has been present for several years. This in her joints including her fingers, her wrists, shoulders, knees, and feet. It is worse at the end of the day. She works as a Building surveyordaycare teacher. She denies any swelling or redness or joints. She denies really from the Neurontin. She has been seeing commercials for fibromyalgia on TV. She is wondering if she has this condition.   She has been seen by by Dr. Margaretha Sheffieldraper for back pain numerous times. A recent MRI demonstrated moderate disc protrusion at L5-S1 with left S1 impingement. Dr. Margaretha Sheffieldraper for the patient to Dr. Yevette Edwardsumonski, spinal surgeon, for further evaluation.  Review of Systems Positive for generalized pain Negative for fever, chills, weight loss, joint swelling    Objective:   Physical Exam BP 120/80  Pulse 80  Temp(Src) 98.2 F (36.8 C) (Oral)  Wt 193 lb 3.2 oz (87.635 kg)  Gen. Obese, middle-aged, African American female, well-appearing, pleasant and conversant MSK: wrist hand and finger, knee, and ankle joints are without swelling, effusion, or redness; patient has significant generalized tenderness of the fat pads around her joints      Assessment & Plan:

## 2014-02-23 ENCOUNTER — Other Ambulatory Visit: Payer: Self-pay | Admitting: *Deleted

## 2014-02-23 DIAGNOSIS — M2550 Pain in unspecified joint: Secondary | ICD-10-CM

## 2014-02-24 ENCOUNTER — Other Ambulatory Visit: Payer: Self-pay | Admitting: Family Medicine

## 2014-02-24 ENCOUNTER — Other Ambulatory Visit: Payer: Self-pay | Admitting: *Deleted

## 2014-02-24 DIAGNOSIS — M2550 Pain in unspecified joint: Secondary | ICD-10-CM

## 2014-02-24 MED ORDER — GABAPENTIN 300 MG PO CAPS: 300.0000 mg | ORAL_CAPSULE | Freq: Two times a day (BID) | ORAL | Status: DC

## 2014-04-08 ENCOUNTER — Telehealth: Payer: Self-pay | Admitting: *Deleted

## 2014-04-08 DIAGNOSIS — M2550 Pain in unspecified joint: Secondary | ICD-10-CM

## 2014-04-08 NOTE — Telephone Encounter (Signed)
Received fax from [facility] requesting a 90 day supply of gabapentin.  Insurance will only cover 90 day supply.  Clovis PuMartin, Dafna Romo L, RN

## 2014-04-09 MED ORDER — GABAPENTIN 300 MG PO CAPS: 300.0000 mg | ORAL_CAPSULE | Freq: Two times a day (BID) | ORAL | Status: DC

## 2014-04-09 NOTE — Telephone Encounter (Signed)
Sent 90 day supply to [facility]

## 2014-12-30 ENCOUNTER — Other Ambulatory Visit: Payer: Self-pay | Admitting: *Deleted

## 2014-12-30 DIAGNOSIS — M2550 Pain in unspecified joint: Secondary | ICD-10-CM

## 2014-12-30 MED ORDER — GABAPENTIN 300 MG PO CAPS: 300.0000 mg | ORAL_CAPSULE | Freq: Two times a day (BID) | ORAL | Status: DC

## 2015-04-15 ENCOUNTER — Other Ambulatory Visit: Payer: Self-pay

## 2015-04-15 DIAGNOSIS — Z1231 Encounter for screening mammogram for malignant neoplasm of breast: Secondary | ICD-10-CM

## 2015-05-10 ENCOUNTER — Ambulatory Visit
Admission: RE | Admit: 2015-05-10 | Discharge: 2015-05-10 | Disposition: A | Discharge status: Home / Self Care | Payer: Managed Care, Other (non HMO) | Source: Ambulatory Visit

## 2015-05-10 DIAGNOSIS — Z1231 Encounter for screening mammogram for malignant neoplasm of breast: Secondary | ICD-10-CM

## 2015-06-22 ENCOUNTER — Other Ambulatory Visit: Payer: Self-pay | Admitting: Family Medicine

## 2015-06-22 DIAGNOSIS — G609 Hereditary and idiopathic neuropathy, unspecified: Secondary | ICD-10-CM

## 2016-01-06 ENCOUNTER — Other Ambulatory Visit: Payer: Self-pay | Admitting: Family Medicine

## 2016-01-06 DIAGNOSIS — G609 Hereditary and idiopathic neuropathy, unspecified: Secondary | ICD-10-CM

## 2016-01-06 MED ORDER — GABAPENTIN 300 MG PO CAPS: ORAL_CAPSULE | ORAL | 3 refills | Status: DC

## 2016-01-06 NOTE — Telephone Encounter (Signed)
Medication refilled

## 2016-01-06 NOTE — Telephone Encounter (Signed)
Pt would like a refill on her gabapentin. She would like it called in to [facility] on [address]. Please call her after its been called in. ep

## 2016-08-23 ENCOUNTER — Ambulatory Visit (INDEPENDENT_AMBULATORY_CARE_PROVIDER_SITE_OTHER): Payer: 59 | Admitting: Family Medicine

## 2016-08-23 DIAGNOSIS — G609 Hereditary and idiopathic neuropathy, unspecified: Secondary | ICD-10-CM | POA: Diagnosis not present

## 2016-08-23 DIAGNOSIS — M797 Fibromyalgia: Secondary | ICD-10-CM | POA: Insufficient documentation

## 2016-08-23 MED ORDER — GABAPENTIN 300 MG PO CAPS: 300.0000 mg | ORAL_CAPSULE | Freq: Three times a day (TID) | ORAL | 5 refills | Status: DC

## 2016-08-23 MED ORDER — DULOXETINE HCL 30 MG PO CPEP: ORAL_CAPSULE | ORAL | 2 refills | Status: DC

## 2016-08-23 NOTE — Progress Notes (Signed)
   HPI  CC: pain; full body Described as full body: hips, arms, wrists, shoulders, pelvis, and some back Worse with cold weather, and sleep deprivation. Warm bath/shower, heating pad >> make better Achy/throbbing in nature Has been dealing with this same pain for "years".  Has been on gabapentin, which helps some but not entirely.   Denies fevers, chills, rash, headache, blurred vision, dizziness, joint swelling, joint stiffness, joint erythema, nausea, vomiting, diarrhea, chest pain, shortness of breath, or depressive symptoms.  Endorses some fatigue occasionally.  CC, SH/smoking status, and VS noted  Objective: BP 132/80   Pulse 70   Temp 97.6 F (36.4 C) (Oral)   Ht '5\' 1"'$  (1.549 m)   Wt 203 lb 6.4 oz (92.3 kg)   SpO2 100%   BMI 38.43 kg/m  Gen: NAD, alert, cooperative, and pleasant. HEENT: NCAT, EOMI, PERRL CV: RRR, no murmur Resp: CTAB, no wheezes, non-labored Abd: SNTND, BS present, no guarding or organomegaly Ext: No edema, warm Neuro: Alert and oriented, Speech clear, No gross deficits   Assessment and plan:  Fibromyalgia Patient is here with signs and symptoms consistent with fibromyalgia. Differential includes myositis, polymyalgia rheumatica, and hypothyroidism. Denies muscle weakness. Duration has been too long for polymyalgia rheumatica, and unlikely due to patient's age of 1. No other symptoms consistent with hypothyroidism. - Initiating Cymbalta >> discussed patience with this medication - Increase gabapentin from 300 mg twice a day to 300 mg 3 times a day >> discussed possibility of increasing this in the future. - Patient to follow-up in 6-8 weeks  Next: Due to lab closure no blood test can be obtained today, in future visit would like to obtain labs including but not limited to CMP, TSH, ESR, CRP. Of note: Patient states that she had been placed on a medication prior to gabapentin. Patient did not know the name of this medication but it may have been  amitriptyline. If Cymbalta fails then would suggest looking into prior use of amitriptyline before initiating this as a second option.   Meds ordered this encounter  Medications  . DULoxetine (CYMBALTA) 30 MG capsule    Sig: Take 1 tablet (30 mg) daily for 7 days. Then take 2 tablets (60 mg) daily after that.    Dispense:  60 capsule    Refill:  2  . gabapentin (NEURONTIN) 300 MG capsule    Sig: Take 1 capsule (300 mg total) by mouth 3 (three) times daily. TAKE 1 CAPSULE (300 MG TOTAL) BY MOUTH 2 (TWO) TIMES DAILY.    Dispense:  180 capsule    Refill:  [place], MD,MS,  PGY3 08/23/2016 6:51 PM

## 2016-08-23 NOTE — Assessment & Plan Note (Signed)
Patient is here with signs and symptoms consistent with fibromyalgia. Differential includes myositis, polymyalgia rheumatica, and hypothyroidism. Denies muscle weakness. Duration has been too long for polymyalgia rheumatica, and unlikely due to patient's age of 13. No other symptoms consistent with hypothyroidism. - Initiating Cymbalta >> discussed patience with this medication - Increase gabapentin from 300 mg twice a day to 300 mg 3 times a day >> discussed possibility of increasing this in the future. - Patient to follow-up in 6-8 weeks  Next: Due to lab closure no blood test can be obtained today, in future visit would like to obtain labs including but not limited to CMP, TSH, ESR, CRP. Of note: Patient states that she had been placed on a medication prior to gabapentin. Patient did not know the name of this medication but it may have been amitriptyline. If Cymbalta fails then would suggest looking into prior use of amitriptyline before initiating this as a second option.

## 2016-08-23 NOTE — Patient Instructions (Signed)
It was a pleasure seeing you today in our clinic. Today we discussed your full body pain. Here is the treatment plan we have discussed and agreed upon together:   - I have started you on Cymbalta. Take 1 tablet (30 mg) daily for 7 days, then take 2 tablets (60 mg) every day after that. 60 mg is our target dose. I would like for you to continue on this medication at this dose until I see you next time in the office in 6-8 weeks from now. - I have increased her gabapentin dose to 1 tablet (300 mg) 3 times a day. If you have a "bad night" you can take 2 tablets before bed. - Come back and see me in 6-8 weeks.

## 2016-08-24 ENCOUNTER — Other Ambulatory Visit: Payer: Self-pay | Admitting: *Deleted

## 2016-08-24 ENCOUNTER — Other Ambulatory Visit: Payer: Self-pay | Admitting: Family Medicine

## 2016-08-24 DIAGNOSIS — G609 Hereditary and idiopathic neuropathy, unspecified: Secondary | ICD-10-CM

## 2016-08-24 MED ORDER — GABAPENTIN 300 MG PO CAPS: 300.0000 mg | ORAL_CAPSULE | Freq: Three times a day (TID) | ORAL | 5 refills | Status: DC

## 2016-08-24 NOTE — Telephone Encounter (Signed)
Clarification needed from pharmacy on instructions for patient's gabapentin.  Script has take 1 capsule 3 times daily and also take 1 capsule 2 times daily on this script.  Please send over the correct instructions for this medication. Hadia Minier,CMA

## 2016-09-12 ENCOUNTER — Telehealth: Payer: Self-pay | Admitting: *Deleted

## 2016-09-12 NOTE — Telephone Encounter (Signed)
Pt calling stating that dr Wende Mott put her on Cymbalta and it makes her feel like a zombie and she wants to be prescribed something else. Please advise and call pt back Joanna [name], CMA

## 2016-09-12 NOTE — Telephone Encounter (Signed)
Pt is calling back to check the status of her request to change her medication. jw

## 2016-09-12 NOTE — Telephone Encounter (Signed)
Patient should stop taking medication and schedule an appointment to discuss what she didn't like about this medication.

## 2016-09-13 NOTE — Telephone Encounter (Signed)
Patient scheduled to see PCP on 4/10.

## 2016-09-18 ENCOUNTER — Telehealth: Payer: Self-pay | Admitting: Family Medicine

## 2016-09-18 NOTE — Telephone Encounter (Signed)
No answer, LMOVM. Called to confirm appointment and advise patient to come in early for check-in and bring all current medications.

## 2016-09-19 ENCOUNTER — Ambulatory Visit (INDEPENDENT_AMBULATORY_CARE_PROVIDER_SITE_OTHER): Payer: 59 | Admitting: Family Medicine

## 2016-09-19 VITALS — BP 142/102 | HR 77 | Temp 98.7°F | Ht 61.0 in | Wt 203.4 lb

## 2016-09-19 DIAGNOSIS — Z79899 Other long term (current) drug therapy: Secondary | ICD-10-CM | POA: Diagnosis not present

## 2016-09-19 DIAGNOSIS — M797 Fibromyalgia: Secondary | ICD-10-CM | POA: Diagnosis not present

## 2016-09-19 MED ORDER — VENLAFAXINE HCL ER 75 MG PO CP24: 75.0000 mg | ORAL_CAPSULE | Freq: Every day | ORAL | 2 refills | Status: DC

## 2016-09-19 NOTE — Progress Notes (Deleted)
   HPI  CC: ***   ROS: ***  Review of Systems See HPI for ROS.   CC, SH/smoking status, and VS noted  Objective: BP (!) 142/102   Pulse 77   Temp 98.7 F (37.1 C) (Oral)   Ht  (1.549 m)   Wt 203 lb 6.4 oz (92.3 kg)   SpO2 98%   BMI 38.43 kg/m  Gen: NAD, alert, cooperative, and pleasant.*** HEENT: NCAT, EOMI, PERRL CV: RRR, no murmur Resp: CTAB, no wheezes, non-labored Abd: SNTND, BS present, no guarding or organomegaly Ext: No edema, warm Neuro: Alert and oriented, Speech clear, No gross deficits   Assessment and plan:  No problem-specific Assessment & Plan notes found for this encounter.   No orders of the defined types were placed in this encounter.   Meds ordered this encounter  Medications  . aspirin-acetaminophen-caffeine (EXCEDRIN MIGRAINE) 250-250-65 MG tablet    Sig: Take 1 tablet by mouth every 6 (six) hours as needed for headache.     Kathee Delton, MD,MS,  PGY3 09/19/2016 10:41 AM

## 2016-09-19 NOTE — Assessment & Plan Note (Addendum)
Patient here for follow-up on her fibromyalgia. At our last visit we had initiated Cymbalta. Patient states that she took a total of 3 tablets of this but could not tolerate the residual side effects of fatigue, and sluggishness. Patient is interested in a different type of medication at this time. - Discontinue Cymbalta - Initiate Effexor - Continue gabapentin as prescribed - Discussed introduction of exercise to her daily life. - Labs ordered today: BMP, CBC, and ESR       - Of note: Although diagnosis of fibromyalgia does not require elevated ESR patient has never had an elevated ESR, and her symptoms do not necessarily coincide with typical fibromyalgia.

## 2016-09-19 NOTE — Patient Instructions (Signed)
It was a pleasure seeing you today in our clinic. Today we discussed your pain. Here is the treatment plan we have discussed and agreed upon together:   - We've discontinued her Cymbalta. - I have initiated Effexor. Take 1 tablet daily every day. I would like to see you back in 1-2 months to discuss your tolerance and response to this medication. - I will be obtaining blood labs today. I will mail you these results in the next week.

## 2016-09-19 NOTE — Progress Notes (Signed)
   HPI  CC: Body pain Patient is here to discuss her medical management for her body pain. At our last visit we had initiated Cymbalta. She states that this medication caused significant drowsiness and she felt as though she was not acting herself. Because of this she only took a total of 3 tablets. Patient is asking if there is any other medications that may help. She states that the increase in her gabapentin from the last visit has helped some but she continues to have pain.  She denies any trauma or falls. No rash, fever, chills, appetite changes, headache, shortness of breath, chest pain, nausea, vomiting, diarrhea, weakness, numbness, or paresthesias.  Review of Systems See HPI for ROS.   CC, SH/smoking status, and VS noted  Objective: BP (!) 142/102   Pulse 77   Temp 98.7 F (37.1 C) (Oral)   Ht 5' 1" (1.549 m)   Wt 203 lb 6.4 oz (92.3 kg)   SpO2 98%   BMI 38.43 kg/m  Gen: NAD, alert, cooperative, very well appearing. CV: RRR, no murmur Resp: CTAB, no wheezes, non-labored Ext: No edema, warm, full ROM, some tenderness at the elbows but no evidence of edema/erythema/ecchymoses or functional limitations. Neuro: Alert and oriented, Speech clear, No gross deficits   Assessment and plan:  Fibromyalgia Patient here for follow-up on her fibromyalgia. At our last visit we had initiated Cymbalta. Patient states that she took a total of 3 tablets of this but could not tolerate the residual side effects of fatigue, and sluggishness. Patient is interested in a different type of medication at this time. - Discontinue Cymbalta - Initiate Effexor - Continue gabapentin as prescribed - Discussed introduction of exercise to her daily life. - Labs ordered today: BMP, CBC, and ESR       - Of note: Although diagnosis of fibromyalgia does not require elevated ESR patient has never had an elevated ESR, and her symptoms do not necessarily coincide with typical fibromyalgia.    Orders Placed  This Encounter  Procedures  . CBC  . Basic Metabolic Panel  . Sedimentation Rate  . Specimen status report    Meds ordered this encounter  Medications  . aspirin-acetaminophen-caffeine (EXCEDRIN MIGRAINE) 250-250-65 MG tablet    Sig: Take 1 tablet by mouth every 6 (six) hours as needed for headache.  . venlafaxine XR (EFFEXOR-XR) 75 MG 24 hr capsule    Sig: Take 1 capsule (75 mg total) by mouth daily.    Dispense:  30 capsule    Refill:  2     Ian D McKeag, MD,MS,  PGY3 09/20/2016 4:36 PM   

## 2016-09-20 LAB — BASIC METABOLIC PANEL
BUN / CREAT RATIO: 10 (ref 9–23)
BUN: 7 mg/dL (ref 6–24)
CALCIUM: 9.1 mg/dL (ref 8.7–10.2)
CO2: 25 mmol/L (ref 18–29)
Chloride: 102 mmol/L (ref 96–106)
Creatinine, Ser: 0.69 mg/dL (ref 0.57–1.00)
GFR, EST AFRICAN AMERICAN: 124 mL/min/{1.73_m2} (ref >59)
GFR, EST NON AFRICAN AMERICAN: 108 mL/min/{1.73_m2} (ref >59)
Glucose: 87 mg/dL (ref 65–99)
Potassium: 4.3 mmol/L (ref 3.5–5.2)
Sodium: 141 mmol/L (ref 134–144)

## 2016-09-20 LAB — CBC
HEMATOCRIT: 39.1 % (ref 34.0–46.6)
Hemoglobin: 13.8 g/dL (ref 11.1–15.9)
MCH: 28 pg (ref 26.6–33.0)
MCHC: 35.3 g/dL (ref 31.5–35.7)
MCV: 80 fL (ref 79–97)
PLATELETS: 327 10*3/uL (ref 150–379)
RBC: 4.92 x10E6/uL (ref 3.77–5.28)
RDW: 13.4 % (ref 12.3–15.4)
WBC: 5.9 10*3/uL (ref 3.4–10.8)

## 2016-09-20 LAB — SPECIMEN STATUS REPORT

## 2016-10-13 ENCOUNTER — Other Ambulatory Visit: Payer: Self-pay | Admitting: Family Medicine

## 2016-10-13 DIAGNOSIS — Z1231 Encounter for screening mammogram for malignant neoplasm of breast: Secondary | ICD-10-CM

## 2016-11-20 ENCOUNTER — Ambulatory Visit
Admission: RE | Admit: 2016-11-20 | Discharge: 2016-11-20 | Disposition: A | Discharge status: Home / Self Care | Payer: 59 | Source: Ambulatory Visit | Attending: Family Medicine | Admitting: Family Medicine

## 2016-11-20 DIAGNOSIS — Z1231 Encounter for screening mammogram for malignant neoplasm of breast: Secondary | ICD-10-CM

## 2016-11-21 ENCOUNTER — Other Ambulatory Visit: Payer: Self-pay | Admitting: Family Medicine

## 2016-11-21 DIAGNOSIS — N644 Mastodynia: Secondary | ICD-10-CM

## 2016-12-24 ENCOUNTER — Emergency Department (HOSPITAL_COMMUNITY): Payer: 59

## 2016-12-24 ENCOUNTER — Emergency Department (HOSPITAL_COMMUNITY)
Admission: EM | Admit: 2016-12-24 | Discharge: 2016-12-24 | Disposition: A | Discharge status: Home / Self Care | Payer: 59 | Attending: Emergency Medicine | Admitting: Emergency Medicine

## 2016-12-24 DIAGNOSIS — Z87891 Personal history of nicotine dependence: Secondary | ICD-10-CM | POA: Insufficient documentation

## 2016-12-24 DIAGNOSIS — M545 Low back pain, unspecified: Secondary | ICD-10-CM

## 2016-12-24 DIAGNOSIS — R109 Unspecified abdominal pain: Secondary | ICD-10-CM

## 2016-12-24 LAB — URINALYSIS, ROUTINE W REFLEX MICROSCOPIC
Bilirubin Urine: NEGATIVE
GLUCOSE, UA: NEGATIVE mg/dL
Hgb urine dipstick: NEGATIVE
Ketones, ur: NEGATIVE mg/dL
Leukocytes, UA: NEGATIVE
Nitrite: NEGATIVE
PH: 6 (ref 5.0–8.0)
Protein, ur: NEGATIVE mg/dL
SPECIFIC GRAVITY, URINE: 1.013 (ref 1.005–1.030)

## 2016-12-24 LAB — CBC WITH DIFFERENTIAL/PLATELET
BASOS PCT: 0 %
Basophils Absolute: 0 10*3/uL (ref 0.0–0.1)
EOS ABS: 0.1 10*3/uL (ref 0.0–0.7)
Eosinophils Relative: 1 %
HEMATOCRIT: 39.3 % (ref 36.0–46.0)
HEMOGLOBIN: 13.8 g/dL (ref 12.0–15.0)
LYMPHS ABS: 2.3 10*3/uL (ref 0.7–4.0)
Lymphocytes Relative: 40 %
MCH: 28 pg (ref 26.0–34.0)
MCHC: 35.1 g/dL (ref 30.0–36.0)
MCV: 79.9 fL (ref 78.0–100.0)
MONO ABS: 0.3 10*3/uL (ref 0.1–1.0)
MONOS PCT: 6 %
NEUTROS ABS: 3.1 10*3/uL (ref 1.7–7.7)
Neutrophils Relative %: 53 %
Platelets: 315 10*3/uL (ref 150–400)
RBC: 4.92 MIL/uL (ref 3.87–5.11)
RDW: 12.4 % (ref 11.5–15.5)
WBC: 5.9 10*3/uL (ref 4.0–10.5)

## 2016-12-24 LAB — BASIC METABOLIC PANEL
Anion gap: 5 (ref 5–15)
BUN: 9 mg/dL (ref 6–20)
CALCIUM: 8.9 mg/dL (ref 8.9–10.3)
CHLORIDE: 107 mmol/L (ref 101–111)
CO2: 28 mmol/L (ref 22–32)
CREATININE: 0.8 mg/dL (ref 0.44–1.00)
GFR calc non Af Amer: >60 mL/min (ref >60)
Glucose, Bld: 100 mg/dL — ABNORMAL HIGH (ref 65–99)
Potassium: 3.8 mmol/L (ref 3.5–5.1)
Sodium: 140 mmol/L (ref 135–145)

## 2016-12-24 MED ORDER — HYDROCODONE-ACETAMINOPHEN 5-325 MG PO TABS
1.0000 | ORAL_TABLET | Freq: Once | ORAL | Status: AC
  Administered 2016-12-24: 1 via ORAL
  Filled 2016-12-24: qty 1

## 2016-12-24 MED ORDER — CYCLOBENZAPRINE HCL 10 MG PO TABS: 10.0000 mg | ORAL_TABLET | Freq: Two times a day (BID) | ORAL | 0 refills | Status: DC | PRN

## 2016-12-24 MED ORDER — NAPROXEN 500 MG PO TABS: 500.0000 mg | ORAL_TABLET | Freq: Two times a day (BID) | ORAL | 0 refills | Status: DC

## 2016-12-24 MED ORDER — HYDROCODONE-ACETAMINOPHEN 5-325 MG PO TABS: 1.0000 | ORAL_TABLET | Freq: Four times a day (QID) | ORAL | 0 refills | Status: DC | PRN

## 2016-12-24 NOTE — Discharge Instructions (Signed)
Work note provided. Take medications as directed. Follow-up with your doctor. Return for any new or worse symptoms. No evidence today of urinary tract infection.

## 2016-12-24 NOTE — ED Triage Notes (Addendum)
Pt c/o progressive aching bilateral flank pain, worse after urination, urinary frequency, garlic-odor to urine urine and intermittent nausea onset Wednesday. No emesis or abdominal pain.  No CVAT. Pain worse with ROM of back.

## 2016-12-24 NOTE — ED Provider Notes (Signed)
[facility]-EMERGENCY DEPT Provider Note   CSN: [id number] Arrival date & time: 12/24/16  4098     History   Chief Complaint Chief Complaint  Patient presents with  . Flank Pain    HPI Joanna [name] is a 41 y.o. female.  Patient with complaint of bilateral CVA pain as well as bilateral flank pain. Also urine has had a strong smell to it. No vomiting symptoms started on Wednesday. No fevers. Patient thinks she has used urinary tract infection. Symptoms are similar. Pain is worse with movement.      Past Medical History:  Diagnosis Date  . Arthritis   . Migraine     Patient Active Problem List   Diagnosis Date Noted  . Fibromyalgia 08/23/2016  . Pain in joint, pelvic region and thigh 09/18/2013  . Back pain 11/21/2012  . Generalized pain 03/19/2012  . MOOD SWINGS 05/11/2010  . MENOPAUSE, SURGICAL 05/11/2010  . SICKLE CELL TRAIT 08/09/2006  . MIGRAINE, UNSPEC., W/O INTRACTABLE MIGRAINE 08/09/2006    Past Surgical History:  Procedure Laterality Date  . ABDOMINAL HYSTERECTOMY    . BREAST SURGERY    . REDUCTION MAMMAPLASTY Bilateral 2001    OB History    No data available       Home Medications    Prior to Admission medications   Medication Sig Start Date End Date Taking? Authorizing Provider  aspirin-acetaminophen-caffeine (EXCEDRIN MIGRAINE) [phone] MG tablet Take 1 tablet by mouth every 6 (six) hours as needed for headache.   Yes [provider]  gabapentin (NEURONTIN) 300 MG capsule Take 1 capsule (300 mg total) by mouth 3 (three) times daily. Patient taking differently: Take 300-600 mg by mouth 3 (three) times daily.  08/24/16  Yes McKeag, Janine Ores, MD  cyclobenzaprine (FLEXERIL) 10 MG tablet Take 1 tablet (10 mg total) by mouth 2 (two) times daily as needed for muscle spasms. 12/24/16   Vanetta Mulders, MD  HYDROcodone-acetaminophen (NORCO/VICODIN) 5-325 MG tablet Take 1-2 tablets by mouth every 6 (six) hours as needed for moderate pain. 12/24/16    Vanetta Mulders, MD  naproxen (NAPROSYN) 500 MG tablet Take 1 tablet (500 mg total) by mouth 2 (two) times daily. 12/24/16   Vanetta Mulders, MD    Family History No family history on file.  Social History Social History  Substance Use Topics  . Smoking status: Former Games developer  . Smokeless tobacco: Not on file  . Alcohol use No     Allergies   Patient has no known allergies.   Review of Systems Review of Systems  Constitutional: Negative for fever.  HENT: Negative for congestion.   Eyes: Negative for redness.  Respiratory: Negative for shortness of breath.   Cardiovascular: Negative for chest pain.  Gastrointestinal: Positive for abdominal pain and nausea.  Genitourinary: Positive for dysuria and flank pain. Negative for hematuria.  Musculoskeletal: Positive for back pain.  Skin: Negative for rash.  Neurological: Negative for headaches.  Hematological: Does not bruise/bleed easily.  Psychiatric/Behavioral: Negative for confusion.     Physical Exam Updated Vital Signs BP 123/78 (BP Location: Left Arm)   Pulse 63   Temp 98.2 F (36.8 C) (Oral)   Resp 18   Ht 1.549 m (5\' 1" )   Wt 90.7 kg (200 lb)   SpO2 99%   BMI 37.79 kg/m   Physical Exam  Constitutional: She is oriented to person, place, and time. She appears well-developed and well-nourished. No distress.  HENT:  Head: Normocephalic and atraumatic.  Mouth/Throat: Oropharynx is  clear and moist.  Eyes: Pupils are equal, round, and reactive to light. EOM are normal.  Neck: Normal range of motion. Neck supple.  Cardiovascular: Normal rate and regular rhythm.   Pulmonary/Chest: Effort normal and breath sounds normal.  Abdominal: Soft. Bowel sounds are normal. There is no tenderness.  Musculoskeletal: Normal range of motion. She exhibits no tenderness.  Neurological: She is alert and oriented to person, place, and time. No cranial nerve deficit or sensory deficit. She exhibits normal muscle tone. Coordination  normal.  Skin: Skin is warm.  Nursing note and vitals reviewed.    ED Treatments / Results  Labs (all labs ordered are listed, but only abnormal results are displayed) Labs Reviewed  URINALYSIS, ROUTINE W REFLEX MICROSCOPIC - Abnormal; Notable for the following:       Result Value   APPearance HAZY (*)    All other components within normal limits  BASIC METABOLIC PANEL - Abnormal; Notable for the following:    Glucose, Bld 100 (*)    All other components within normal limits  CBC WITH DIFFERENTIAL/PLATELET    EKG  EKG Interpretation None       Radiology Ct Renal Stone Study  Result Date: 12/24/2016 CLINICAL DATA:  Progressive BILATERAL flank pain and aching worse after urination, urinary frequency, chronic garlic odor to urine, intermittent nausea, onset of symptoms Wednesday EXAM: CT ABDOMEN AND PELVIS WITHOUT CONTRAST TECHNIQUE: Multidetector CT imaging of the abdomen and pelvis was performed following the standard protocol without IV contrast. Sagittal and coronal MPR images reconstructed from axial data set. No oral contrast administered. COMPARISON:  None FINDINGS: Lower chest: Lung bases clear Hepatobiliary: Liver and gallbladder unremarkable Pancreas: Normal appearance Spleen: Normal appearance Adrenals/Urinary Tract: Adrenal glands, kidneys, ureters, and bladder normal appearance Stomach/Bowel: Normal appendix. Stomach and bowel loops normal appearance Vascular/Lymphatic: Aorta normal caliber. Scattered pelvic phleboliths. No adenopathy. Reproductive: Uterus surgically absent. Minimal air in vagina. Ovaries unremarkable. Other: No free air or free fluid.  No inflammatory process. Musculoskeletal: Bones unremarkable. IMPRESSION: No acute intra-abdominal or intrapelvic abnormalities. Electronically Signed   By: Ulyses SouthwardMark  Boles M.D.   On: 12/24/2016 12:17    Procedures Procedures (including critical care time)  Medications Ordered in ED Medications  HYDROcodone-acetaminophen  (NORCO/VICODIN) 5-325 MG per tablet 1 tablet (1 tablet Oral Given 12/24/16 1142)     Initial Impression / Assessment and Plan / ED Course  I have reviewed the triage vital signs and the nursing notes.  Pertinent labs & imaging results that were available during my care of the patient were reviewed by me and considered in my medical decision making (see chart for details).    Symptoms seem to be consistent with urinary tract infection. Urinalysis was normal. So workup was expanded. CT renal study without any acute findings. CBC and basic metabolic panel without any abnormalities. Symptoms may very well be musculoskeletal in nature. No fall or injury. Will treat as such.   Final Clinical Impressions(s) / ED Diagnoses   Final diagnoses:  Flank pain  Acute bilateral low back pain without sciatica    New Prescriptions New Prescriptions   CYCLOBENZAPRINE (FLEXERIL) 10 MG TABLET    Take 1 tablet (10 mg total) by mouth 2 (two) times daily as needed for muscle spasms.   HYDROCODONE-ACETAMINOPHEN (NORCO/VICODIN) 5-325 MG TABLET    Take 1-2 tablets by mouth every 6 (six) hours as needed for moderate pain.   NAPROXEN (NAPROSYN) 500 MG TABLET    Take 1 tablet (500 mg total) by mouth  2 (two) times daily.     Vanetta Mulders, MD 12/24/16 1243

## 2017-01-02 ENCOUNTER — Telehealth: Payer: Self-pay

## 2017-01-02 DIAGNOSIS — G609 Hereditary and idiopathic neuropathy, unspecified: Secondary | ICD-10-CM

## 2017-01-02 NOTE — Telephone Encounter (Signed)
  Pt calling to request refill of: Gabapentin  Name of Medication(s): gabapentin, 300 MG  Last date of OV:  09/19/16 Pharmacy:  [facility] [address]   Will route refill request to Clinic RN.  Discussed with patient policy to call pharmacy for future refills.  Also, discussed refills may take up to 48 hours to approve or deny.  Sunday SpillersSharon T Saunders   Please call patient @ [phone] when this is has been sent in. Sunday SpillersSharon T Saunders, CMA

## 2017-01-03 MED ORDER — GABAPENTIN 300 MG PO CAPS: 300.0000 mg | ORAL_CAPSULE | Freq: Three times a day (TID) | ORAL | 3 refills | Status: DC

## 2017-01-03 NOTE — Addendum Note (Signed)
Addended by: Jamelle HaringFITZGERALD, HILLARY M on: 01/03/2017 09:48 AM   Modules accepted: Orders

## 2017-01-03 NOTE — Telephone Encounter (Signed)
Placed rx as requested.

## 2017-05-25 ENCOUNTER — Other Ambulatory Visit: Payer: Self-pay | Admitting: Internal Medicine

## 2017-05-25 DIAGNOSIS — G609 Hereditary and idiopathic neuropathy, unspecified: Secondary | ICD-10-CM

## 2017-05-25 MED ORDER — GABAPENTIN 300 MG PO CAPS: 300.0000 mg | ORAL_CAPSULE | Freq: Three times a day (TID) | ORAL | 3 refills | Status: DC

## 2017-07-23 ENCOUNTER — Other Ambulatory Visit: Payer: Self-pay

## 2017-07-23 ENCOUNTER — Ambulatory Visit (INDEPENDENT_AMBULATORY_CARE_PROVIDER_SITE_OTHER): Payer: 59 | Admitting: Family Medicine

## 2017-07-23 ENCOUNTER — Encounter: Payer: Self-pay | Admitting: Family Medicine

## 2017-07-23 VITALS — BP 112/76 | HR 89 | Temp 98.7°F | Ht 61.5 in | Wt 197.8 lb

## 2017-07-23 DIAGNOSIS — J029 Acute pharyngitis, unspecified: Secondary | ICD-10-CM | POA: Diagnosis not present

## 2017-07-23 DIAGNOSIS — R6889 Other general symptoms and signs: Secondary | ICD-10-CM | POA: Diagnosis not present

## 2017-07-23 LAB — POCT RAPID STREP A (OFFICE): RAPID STREP A SCREEN: NEGATIVE

## 2017-07-23 NOTE — Patient Instructions (Signed)
It was a pleasure to see you today! Thank you for choosing [facility] for your primary care. Joanna [name] was seen for viral illness.   Our plans for today were:  Keep using the home treatments such as elderberry, dayquil, and chloraseptic. Be careful not to take too much tylenol (it's in dayquil).   Return if you don't feel better in 1 week. The cough may linger after you feel better.   Best,  Dr. Chanetta Marshallimberlake

## 2017-07-23 NOTE — Progress Notes (Signed)
   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 Onset of illness 5 days ago. Began with sore throat. Also had chills, did not check her temp. +nonproductive cough. No chest pain. No dysuria, abdominal pain.   ROS: denies CP, SOB, abd pain, dysuria, changes in BMs. +Chills, no measured fever.    CC, SH/smoking status, and VS noted  Objective: BP 112/76   Pulse 89   Temp 98.7 F (37.1 C)   Ht 5' 1.5" (1.562 m)   Wt 197 lb 12.8 oz (89.7 kg)   SpO2 97%   BMI 36.77 kg/m  Gen: NAD, alert, cooperative, and pleasant. HEENT: NCAT, EOMI, PERRL. nontender sinuses, mild pharygeal erythema. Clear TMs, no LAD. CV: RRR, no murmur Resp: CTAB, no wheezes, non-labored Ext: No edema, warm Neuro: Alert and oriented, Speech clear, No gross deficits  Assessment and plan:  Viral URI: patient with exposure to children, and would need to miss work if confirmed flu or strep. Rapid strep negative. Send out flu. Less likely flu. Even if positive, would not meet criteria for tamiflu and she had +N/V with tamiflu in the past. Will continue her home supportive therapy such as elderberry, APAP, ibuprofen, cough drops. Encourage good PO hydration. RTC if not improving in 1 week.   Orders Placed This Encounter  Procedures  . Influenza a and b  . Rapid Strep A    Loni MuseKate Gicela Schwarting, MD, PGY2 07/24/2017 9:32 AM

## 2017-07-24 LAB — INFLUENZA A AND B
INFLUENZA B AG, EIA: NEGATIVE
Influenza A Ag, EIA: NEGATIVE

## 2017-07-24 LAB — PLEASE NOTE:

## 2017-08-20 ENCOUNTER — Other Ambulatory Visit: Payer: Self-pay

## 2017-08-20 ENCOUNTER — Encounter (HOSPITAL_COMMUNITY): Payer: Self-pay

## 2017-08-20 ENCOUNTER — Emergency Department (HOSPITAL_COMMUNITY): Payer: 59

## 2017-08-20 ENCOUNTER — Emergency Department (HOSPITAL_COMMUNITY)
Admission: EM | Admit: 2017-08-20 | Discharge: 2017-08-20 | Disposition: A | Discharge status: Home / Self Care | Payer: 59 | Attending: Emergency Medicine | Admitting: Emergency Medicine

## 2017-08-20 DIAGNOSIS — R079 Chest pain, unspecified: Secondary | ICD-10-CM | POA: Insufficient documentation

## 2017-08-20 DIAGNOSIS — Z79899 Other long term (current) drug therapy: Secondary | ICD-10-CM | POA: Insufficient documentation

## 2017-08-20 LAB — CBC
HCT: 40.2 % (ref 36.0–46.0)
HEMOGLOBIN: 14 g/dL (ref 12.0–15.0)
MCH: 28.7 pg (ref 26.0–34.0)
MCHC: 34.8 g/dL (ref 30.0–36.0)
MCV: 82.5 fL (ref 78.0–100.0)
Platelets: 321 10*3/uL (ref 150–400)
RBC: 4.87 MIL/uL (ref 3.87–5.11)
RDW: 12.9 % (ref 11.5–15.5)
WBC: 9 10*3/uL (ref 4.0–10.5)

## 2017-08-20 LAB — I-STAT BETA HCG BLOOD, ED (MC, WL, AP ONLY): I-stat hCG, quantitative: <5 m[IU]/mL (ref <5)

## 2017-08-20 LAB — I-STAT TROPONIN, ED: TROPONIN I, POC: 0 ng/mL (ref 0.00–0.08)

## 2017-08-20 LAB — TROPONIN I: Troponin I: <0.03 ng/mL (ref <0.03)

## 2017-08-20 LAB — BASIC METABOLIC PANEL
ANION GAP: 8 (ref 5–15)
BUN: 6 mg/dL (ref 6–20)
CALCIUM: 9 mg/dL (ref 8.9–10.3)
CO2: 24 mmol/L (ref 22–32)
Chloride: 106 mmol/L (ref 101–111)
Creatinine, Ser: 0.72 mg/dL (ref 0.44–1.00)
Glucose, Bld: 91 mg/dL (ref 65–99)
Potassium: 3.7 mmol/L (ref 3.5–5.1)
Sodium: 138 mmol/L (ref 135–145)

## 2017-08-20 MED ORDER — ASPIRIN 81 MG PO CHEW
324.0000 mg | CHEWABLE_TABLET | Freq: Once | ORAL | Status: AC
Start: 2017-08-20 — End: 2017-08-20
  Administered 2017-08-20: 324 mg via ORAL
  Filled 2017-08-20: qty 4

## 2017-08-20 NOTE — ED Triage Notes (Signed)
Pt states central chest pain radiating into her left shoulder and arm that began around 1hr ago. Denies any associated sx. Denies any cardiac hx. Skin warm and dry. Pt tearful in triage. Pt states increased stress at work lately.

## 2017-08-20 NOTE — ED Provider Notes (Addendum)
[facility] EMERGENCY DEPARTMENT Provider Note   CSN: [id number] Arrival date & time: 08/20/17  1154     History   Chief Complaint Chief Complaint  Patient presents with  . Chest Pain    HPI Joanna [name] is a 42 y.o. female.  HPI     42 year old female presents today with complaints of chest pain.  Patient notes she was in her usual state of health this morning until she had a stressful event at work.  She notes at approximately 11:15 AM shortly before arrival to the emergency room she was called in to his supervisors office and told unsettling news.  She notes she immediately had left-sided chest pain crepitus tightness, very minimal shortness of breath with radiation into her left arm.  She notes she was very tearful at this time.  She notes some pain with inspiration and movement of the left shoulder and chest.  She notes that symptoms have significantly improved still having very minimal chest tightness.  Patient denies any diaphoresis throughout the entire episode.  Patient denies any personal or significant family cardiac history, she denies any history of smoking, diabetes, hypertension, high cholesterol.  Patient notes a history of anxiety and notes she is had similar episodes but they did not happen so fast.  Patient denies any previous history of blood clots, denies any significant risk factors.    Past Medical History:  Diagnosis Date  . Arthritis   . Migraine     Patient Active Problem List   Diagnosis Date Noted  . Fibromyalgia 08/23/2016  . Pain in joint, pelvic region and thigh 09/18/2013  . Back pain 11/21/2012  . Generalized pain 03/19/2012  . MOOD SWINGS 05/11/2010  . MENOPAUSE, SURGICAL 05/11/2010  . SICKLE CELL TRAIT 08/09/2006  . MIGRAINE, UNSPEC., W/O INTRACTABLE MIGRAINE 08/09/2006    Past Surgical History:  Procedure Laterality Date  . ABDOMINAL HYSTERECTOMY    . BREAST SURGERY    . REDUCTION MAMMAPLASTY Bilateral 2001     OB History    No data available       Home Medications    Prior to Admission medications   Medication Sig Start Date End Date Taking? Authorizing Provider  aspirin-acetaminophen-caffeine (EXCEDRIN MIGRAINE) 450 114 7181 MG tablet Take 1 tablet by mouth every 6 (six) hours as needed for headache.    [provider]  gabapentin (NEURONTIN) 300 MG capsule Take 1-2 capsules (300-600 mg total) by mouth 3 (three) times daily. 05/25/17   Casey Burkitt, MD    Family History History reviewed. No pertinent family history.  Social History Social History   Tobacco Use  . Smoking status: Former Games developer  . Smokeless tobacco: Never Used  Substance Use Topics  . Alcohol use: No  . Drug use: No     Allergies   Patient has no known allergies.   Review of Systems Review of Systems  All other systems reviewed and are negative.    Physical Exam Updated Vital Signs BP 132/76   Pulse 82   Temp 99.3 F (37.4 C) (Oral)   Resp 19   SpO2 100%   Physical Exam  Constitutional: She is oriented to person, place, and time. She appears well-developed and well-nourished.  HENT:  Head: Normocephalic and atraumatic.  Eyes: Conjunctivae are normal. Pupils are equal, round, and reactive to light. Right eye exhibits no discharge. Left eye exhibits no discharge. No scleral icterus.  Neck: Normal range of motion. No JVD present. No tracheal deviation present.  Cardiovascular: Normal rate, regular rhythm and intact distal pulses. Exam reveals no gallop and no friction rub.  Murmur heard. Soft systolic murmur -equal bilateral radial pulses  Pulmonary/Chest: Effort normal and breath sounds normal. No stridor. No respiratory distress. She has no wheezes. She has no rales. She exhibits no tenderness.  Musculoskeletal: Normal range of motion. She exhibits no edema.  Neurological: She is alert and oriented to person, place, and time. Coordination normal.  Psychiatric: She has a  normal mood and affect. Her behavior is normal. Judgment and thought content normal.  Nursing note and vitals reviewed.   ED Treatments / Results  Labs (all labs ordered are listed, but only abnormal results are displayed) Labs Reviewed  BASIC METABOLIC PANEL  CBC  TROPONIN I  I-STAT TROPONIN, ED  I-STAT BETA HCG BLOOD, ED ([facility], [facility], [facility] ONLY)    EKG  EKG Interpretation  Date/Time:  Monday August 20 2017 11:59:29 EDT Ventricular Rate:  89 PR Interval:  136 QRS Duration: 76 QT Interval:  368 QTC Calculation: 447 R Axis:   35 Text Interpretation:  Normal sinus rhythm Cannot rule out Anterior infarct , age undetermined Abnormal ECG No significant change was found Confirmed by Azalia Bilisampos, Kevin ([id number]) on 08/20/2017 6:36:42 PM       Radiology Dg Chest 2 View  Result Date: 08/20/2017 CLINICAL DATA:  Central chest pain radiating to the left shoulder and arm beginning 1 hour ago. EXAM: CHEST - 2 VIEW COMPARISON:  03/06/2013 FINDINGS: Heart size is normal. Mediastinal shadows are normal. The lungs are clear. No bronchial thickening. No infiltrate, mass, effusion or collapse. Pulmonary vascularity is normal. No bony abnormality. IMPRESSION: Normal chest Electronically Signed   By: Paulina FusiMark  Shogry M.D.   On: 08/20/2017 12:58    Procedures Procedures (including critical care time)  Medications Ordered in ED Medications  aspirin chewable tablet 324 mg (324 mg Oral Given 08/20/17 1853)     Initial Impression / Assessment and Plan / ED Course  I have reviewed the triage vital signs and the nursing notes.  Pertinent labs & imaging results that were available during my care of the patient were reviewed by me and considered in my medical decision making (see chart for details).     Final Clinical Impressions(s) / ED Diagnoses   Final diagnoses:  Chest pain in adult    Labs: I-STAT troponin, i-STAT beta-hCG, BMP, CBC, troponin  Imaging: DG chest 2 view  Consults:  Therapeutics:  Aspirin  Discharge Meds:   Assessment/Plan: 42 year old female presents today with complaints of chest pain.  Patient likely having anxiety and panic attack.  Patient having chest tightness that has been improving.  Patient does have a very low heart score of 1, negative delta troponin and a reassuring EKG, low suspicion for acute coronary syndrome here.  Patient with no significant risk factors for DVT or PE.  Patient will need outpatient cardiology follow-up, she is given strict return precautions.  She verbalized understanding and agreement to today's plan had no further questions or concerns at time of discharge.      ED Discharge Orders    None          Rosalio LoudHedges, Theodor Mustin, PA-C 08/20/17 2037    Azalia Bilisampos, Kevin, MD 08/20/17 2259

## 2017-08-20 NOTE — Discharge Instructions (Signed)
Please read attached information. If you experience any new or worsening signs or symptoms please return to the emergency room for evaluation. Please follow-up with your primary care provider or specialist as discussed.  °

## 2017-09-10 ENCOUNTER — Ambulatory Visit (INDEPENDENT_AMBULATORY_CARE_PROVIDER_SITE_OTHER): Payer: 59 | Admitting: Internal Medicine

## 2017-09-10 VITALS — BP 150/80 | HR 77 | Temp 98.5°F | Wt 202.4 lb

## 2017-09-10 DIAGNOSIS — M5432 Sciatica, left side: Secondary | ICD-10-CM

## 2017-09-10 MED ORDER — PREDNISONE 50 MG PO TABS: 50.0000 mg | ORAL_TABLET | Freq: Every day | ORAL | 0 refills | Status: DC

## 2017-09-10 NOTE — Progress Notes (Signed)
   [facility] Noralee CharsAsiyah Deondrae Mcgrail, MD Phone: [phone]  Reason For Visit: SDA for Left leg radicular pain   # Radicular Pain  She indicates that she has been having pain in her left buttocks that radiates down into her foot.  She denies any significant back pain. She  states that the pain is worse when she is standing up for long period of time.  She has not tried anything for this pain.  She does take gabapentin regularly for her fibromyalgia.  She denies any weakness in her leg.  She denies any history of trauma or injury to the area.  She states that she has had an episode of pain like this previously.  She however she was on vacation at the time and was never seen by a doctor as the pain resolvedon its own  Prior history of similar pain:yes History of cancer: No Weak immune system: No History of IV drug use: No History of steroid use: Non  Symptoms Incontinence of bowel or bladder:  No Numbness of leg: No Fever: No Rest or Night pain: yes  Weight Loss:  No  Past Medical History Reviewed problem list.  Medications- reviewed and updated No additions to family history Social history- patient is a non-smoker  Objective: BP (!) 150/80 (BP Location: Left Arm, Patient Position: Sitting, Cuff Size: Normal)   Pulse 77   Temp 98.5 F (36.9 C) (Oral)   Wt 202 lb 6.4 oz (91.8 kg)   SpO2 99%   BMI 37.62 kg/m  Gen: NAD, alert, cooperative with exam Cardio: regular rate and rhythm, S1S2 heard, no murmurs appreciated Pulm: clear to auscultation bilaterally, no wheezes, rhonchi or rales Extremities: Tenderness in left buttocks, pain with both flexion and extension of back, 5 out of 5 strength in lower extremities, neurovascularly intact Skin: dry, intact, no rashes or lesions  Assessment/Plan: See problem based a/p  Sciatic pain, left Most concerning for piriformis syndrome   - given location of pain  - Patient to increase gabapentin from 600 mg to 900 mg in  the AM - predniSONE (DELTASONE) 50 MG tablet; Take 1 tablet (50 mg total) by mouth daily with breakfast.  Dispense: 7 tablet; Refill: 0 - Provided patient with exercise to try  - Follow up in about two weeks if no improvement

## 2017-09-10 NOTE — Patient Instructions (Signed)

## 2017-09-11 ENCOUNTER — Encounter: Payer: Self-pay | Admitting: Internal Medicine

## 2017-09-11 DIAGNOSIS — M5432 Sciatica, left side: Secondary | ICD-10-CM | POA: Insufficient documentation

## 2017-09-11 DIAGNOSIS — M5430 Sciatica, unspecified side: Secondary | ICD-10-CM | POA: Insufficient documentation

## 2017-09-11 NOTE — Assessment & Plan Note (Signed)
Most concerning for piriformis syndrome   - given location of pain  - Patient to increase gabapentin from 600 mg to 900 mg in the AM - predniSONE (DELTASONE) 50 MG tablet; Take 1 tablet (50 mg total) by mouth daily with breakfast.  Dispense: 7 tablet; Refill: 0 - Provided patient with exercise to try  - Follow up in about two weeks if no improvement

## 2017-10-03 ENCOUNTER — Other Ambulatory Visit: Payer: Self-pay | Admitting: Internal Medicine

## 2017-10-03 DIAGNOSIS — G609 Hereditary and idiopathic neuropathy, unspecified: Secondary | ICD-10-CM

## 2017-11-15 NOTE — Progress Notes (Signed)
   Subjective:   Patient ID: Joanna [name]    DOB: Oct 14, 1975, 42 y.o. female   MRN: [id number]  Joanna SealKimberly K Bunkley is a 42 y.o. female with a history of migraine, fibromyalgia here for   Sinus issues Patient states she woke up Tuesday morning with a scratchy throat and drainage that progressively got worse and today endorses head congestion with productive cough.  Got an over-the-counter decongestant which helped however she still feels fullness in her head with pain and occasional dizziness.  Endorses productive cough with green sputum. No fevers, chest pain, shortness of breath.  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  Has taken Excedrin migraine for headache with some relief.  She has history of migraines however this feels differently than typical migraines.  Current pain and congestion is located around the front of her head and under her eyes.  No hemoptysis.  Has noted a tinge of blood in snot. No body aches. No N/V/D. Eating and drinking ok, but can't taste anything.  Review of Systems:  Per HPI.   PMFSH, medications and smoking status reviewed.  Objective:   BP 114/70   Pulse 84   Temp 98.2 F (36.8 C) (Oral)   Ht 5\' 1"  (1.549 m)   Wt 206 lb (93.4 kg)   SpO2 98%   BMI 38.92 kg/m  Vitals and nursing note reviewed.  General: well nourished, well developed, fatigued appearing, in no acute distress with non-toxic appearance HEENT: normocephalic, atraumatic, moist mucous membranes. TMs clear with no purulence, bulging, or erythema. No pain with palpation of paranasal sinuses. Erythematous throat with postnasal drainage visible. Erythematous nasal turbinates. 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 Supportive care recommended. Provided written script for amoxicillin with instructions to fill if no better in a few days. Return precautions given.  No orders of the defined types were placed in this encounter.  Meds ordered this encounter  Medications  . amoxicillin (AMOXIL) 875 MG tablet    Sig: Take 1 tablet (875 mg total) by mouth 2 (two) times daily for 5 days.    Dispense:  10 tablet    Refill:  0    Ellwood DenseAlison Jennyfer Nickolson, DO PGY-1, [facility] 11/16/2017 6:10 PM

## 2017-11-16 ENCOUNTER — Other Ambulatory Visit: Payer: Self-pay

## 2017-11-16 ENCOUNTER — Encounter: Payer: Self-pay | Admitting: Family Medicine

## 2017-11-16 ENCOUNTER — Ambulatory Visit (INDEPENDENT_AMBULATORY_CARE_PROVIDER_SITE_OTHER): Payer: 59 | Admitting: Family Medicine

## 2017-11-16 DIAGNOSIS — R0981 Nasal congestion: Secondary | ICD-10-CM

## 2017-11-16 MED ORDER — AMOXICILLIN 875 MG PO TABS: 875.0000 mg | ORAL_TABLET | Freq: Two times a day (BID) | ORAL | 0 refills | Status: AC

## 2017-11-16 NOTE — Assessment & Plan Note (Addendum)
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 Supportive care recommended. Provided written script for amoxicillin with instructions to fill if no better in a few days. Return precautions given.

## 2017-11-16 NOTE — Patient Instructions (Signed)
It was great to see you!  It is likely you have a virus causing your symptoms and it should start to get better about 7 - 10 days after it started, if it does not take the antibiotic prescribed.  For your nasal congestion and runny nose, try using Afrin (generic is Oxymetazoline) twice daily for 3 days.  Do not use for longer that 3 days.    Some other therapies you can try are: push fluids, rest and return office visit prn if symptoms persist or worsen. Try using Mucinex to help break up mucus and secretions.  Drinking warm liquids such as teas and soups can help with secretions and cough. A mist humidifier or vaporizer can work well to help with secretions and cough.  It is very important to clean the humidifier between use according to the instructions.    It was good to see you.  If you're still having trouble in the next week, come back and see us.    Of course, if you start having trouble breathing, worsening fevers, vomiting and unable to hold down any fluids, or you have other concerns, don't hesitate to come back or go to the ED after hours.   Take care and seek immediate care sooner if you develop any concerns.   Dr. Mollie Germanyumball [facility]

## 2017-11-20 ENCOUNTER — Other Ambulatory Visit: Payer: Self-pay | Admitting: Internal Medicine

## 2017-11-20 DIAGNOSIS — G609 Hereditary and idiopathic neuropathy, unspecified: Secondary | ICD-10-CM

## 2017-11-22 ENCOUNTER — Other Ambulatory Visit: Payer: Self-pay

## 2017-11-22 DIAGNOSIS — G609 Hereditary and idiopathic neuropathy, unspecified: Secondary | ICD-10-CM

## 2017-11-22 MED ORDER — GABAPENTIN 300 MG PO CAPS: 300.0000 mg | ORAL_CAPSULE | Freq: Three times a day (TID) | ORAL | 0 refills | Status: DC

## 2017-11-22 NOTE — Telephone Encounter (Signed)
Pt calling for refill of gabapentin. Please send to [facility] [address] Pt can be reached at [phone] or [phone]. Sunday SpillersSharon T Antoine Vandermeulen, CMA

## 2017-12-21 ENCOUNTER — Encounter: Payer: Self-pay | Admitting: Family Medicine

## 2017-12-21 ENCOUNTER — Ambulatory Visit (INDEPENDENT_AMBULATORY_CARE_PROVIDER_SITE_OTHER): Payer: 59 | Admitting: Family Medicine

## 2017-12-21 ENCOUNTER — Ambulatory Visit: Payer: Self-pay

## 2017-12-21 VITALS — BP 104/80 | HR 82 | Ht 61.0 in | Wt 208.0 lb

## 2017-12-21 DIAGNOSIS — M25572 Pain in left ankle and joints of left foot: Principal | ICD-10-CM

## 2017-12-21 DIAGNOSIS — G8929 Other chronic pain: Secondary | ICD-10-CM

## 2017-12-21 DIAGNOSIS — M7672 Peroneal tendinitis, left leg: Secondary | ICD-10-CM | POA: Diagnosis not present

## 2017-12-21 NOTE — Progress Notes (Signed)
[name] [address] [place], [place] [address] Phone: [phone] Subjective:     CC: Left foot and ankle pain  [id number]  Joanna [name] is a 42 y.o. female coming in with complaint of left foot/ankle pain. Lateral ankle is swollen. Stated she felt like her ankle was giving out. Pain radiates up the leg and stops at the knee. No prior injury. 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   Onset- Chronic Location- lateral ankle to base of 5th  Character- Sharp Aggravating factors- certain ROM, Inversion Reliving factors-not walking in certain shoes help Therapies tried-different shoes seem to make some improvement Severity-5 out of 10     Past Medical History:  Diagnosis Date  . Arthritis   . Migraine    Past Surgical History:  Procedure Laterality Date  . ABDOMINAL HYSTERECTOMY    . BREAST SURGERY    . REDUCTION MAMMAPLASTY Bilateral 2001   Social History   Socioeconomic History  . Marital status: Legally Separated    Spouse name: Not on file  . Number of children: Not on file  . Years of education: Not on file  . Highest education level: Not on file  Occupational History  . Not on file  Social Needs  . Financial resource strain: Not on file  . Food insecurity:    Worry: Not on file    Inability: Not on file  . Transportation needs:    Medical: Not on file    Non-medical: Not on file  Tobacco Use  . Smoking status: Former Games developer  . Smokeless tobacco: Never Used  Substance and Sexual Activity  . Alcohol use: No  . Drug use: No  . Sexual activity: Yes    Birth control/protection: Surgical  Lifestyle  . Physical activity:    Days per week: Not on file    Minutes per session: Not on file  . Stress: Not on file  Relationships  . Social connections:    Talks on phone: Not on file    Gets together: Not on file    Attends religious service: Not on file    Active member of club or organization: Not on file   Attends meetings of clubs or organizations: Not on file    Relationship status: Not on file  Other Topics Concern  . Not on file  Social History Narrative  . Not on file   No Known Allergies No family history on file.  No family history of autoimmune   Past medical history, social, surgical and family history all reviewed in electronic medical record.  No pertanent information unless stated regarding to the chief complaint.   Review of Systems:Review of systems updated and as accurate as of 12/21/17  No headache, visual changes, nausea, vomiting, diarrhea, constipation, dizziness, abdominal pain, skin rash, fevers, chills, night sweats, weight loss, swollen lymph nodes, body aches, joint swelling, muscle aches, chest pain, shortness of breath, mood changes.   Objective  Blood pressure 104/80, pulse 82, height 5\' 1"  (1.549 m), weight 208 lb (94.3 kg), SpO2 96 %. Systems examined below as of 12/21/17   General: No apparent distress alert and oriented x3 mood and affect normal, dressed appropriately.  HEENT: Pupils equal, extraocular movements intact  Respiratory: Patient's speak in full sentences and does not appear short of breath  Cardiovascular: No lower extremity edema, non tender, no erythema  Skin: Warm dry intact with no signs of infection or rash on extremities  or on axial skeleton.  Abdomen: Soft nontender  Neuro: Cranial nerves II through XII are intact, neurovascularly intact in all extremities with 2+ DTRs and 2+ pulses.  Lymph: No lymphadenopathy of posterior or anterior cervical chain or axillae bilaterally.  Gait normal with good balance and coordination.  MSK:  Non tender with full range of motion and good stability and symmetric strength and tone of shoulders, elbows, wrist, hip, knee bilaterally.  Ankle: Left Swelling over the posterior lateral malleolus Range of motion is full in all directions. Strength is 5/5 in all directions except resisted eversion Stable  lateral and medial ligaments; squeeze test and kleiger test unremarkable; Talar dome nontender; No pain at base of 5th MT; No tenderness over cuboid; No tenderness over N spot or navicular prominence No tenderness on posterior aspects of lateral and medial malleolus Peroneal subluxation noted with severe tenderness noted. Negative tarsal tunnel tinel's Able to walk 4 steps. Contralateral ankle unremarkable  MSK US performed of: Left ankle This study was ordered, performed, and interpreted by Terrilee FilesZach Smith D.O.  Foot/Ankle:   All structures visualized.   Talar dome unremarkable  Ankle mortise without effusion. Peroneal tendons do show subluxation noted with some hypoechoic changes.  Cranial cyst noted.  This extends from the lateral malleolus to 2 cm proximal to the fifth metatarsal. Rest of exam shows no bony abnormality.  Patient does have a cyst between the third and fourth metatarsal heads.  IMPRESSION: Peroneal subluxation    Impression and Recommendations:     This case required medical decision making of moderate complexity.      Note: This dictation was prepared with Dragon dictation along with smaller phrase technology. Any transcriptional errors that result from this process are unintentional.

## 2017-12-21 NOTE — Assessment & Plan Note (Signed)
Peroneal tendinitis and subluxation.  Discussed icing regimen.  Discussed compression, home exercises given.  Topical anti-inflammatories given.  Follow-up again in 4 weeks

## 2017-12-21 NOTE — Patient Instructions (Signed)
Good to see you  Peroneal subluxation with a peroneal cyst  Ice is your friend. Ice 20 minutes 2 times daily. Usually after activity and before bed. pennsaid pinkie amount topically 2 times daily as needed.  Shoes with a little heel and should help  Body helix x-linked ankle compression size medium could help a lot as well  Exercises 3 times a week.  See me again in 4 weeks if not perfect

## 2018-01-19 ENCOUNTER — Other Ambulatory Visit: Payer: Self-pay | Admitting: Internal Medicine

## 2018-01-19 DIAGNOSIS — G609 Hereditary and idiopathic neuropathy, unspecified: Secondary | ICD-10-CM

## 2018-01-21 NOTE — Progress Notes (Signed)
Joanna [name] D.O. [facility] [address] [place], [place] [address] Phone: [phone] Subjective:      CC: Left ankle pain follow-up  OZH:YQMVHQIONGHPI:Subjective  Joanna SealKimberly K [name] is a 42 y.o. female coming in with complaint of ankle pain.  Found to initially have a peroneal subluxation states that her ankle is better than the last visit.  85% better.  Seems like the pain has now migrated more towards the foot.  Points to the base of the fifth metatarsal.  States that that is seems to be more sore than anything else and can wake her up at night.  Does not remember any true injury.  Patient feels like that the ankle exercises have been beneficial.      Past Medical History:  Diagnosis Date  . Arthritis   . Migraine    Past Surgical History:  Procedure Laterality Date  . ABDOMINAL HYSTERECTOMY    . BREAST SURGERY    . REDUCTION MAMMAPLASTY Bilateral 2001   Social History   Socioeconomic History  . Marital status: Legally Separated    Spouse name: Not on file  . Number of children: Not on file  . Years of education: Not on file  . Highest education level: Not on file  Occupational History  . Not on file  Social Needs  . Financial resource strain: Not on file  . Food insecurity:    Worry: Not on file    Inability: Not on file  . Transportation needs:    Medical: Not on file    Non-medical: Not on file  Tobacco Use  . Smoking status: Former Games developermoker  . Smokeless tobacco: Never Used  Substance and Sexual Activity  . Alcohol use: No  . Drug use: No  . Sexual activity: Yes    Birth control/protection: Surgical  Lifestyle  . Physical activity:    Days per week: Not on file    Minutes per session: Not on file  . Stress: Not on file  Relationships  . Social connections:    Talks on phone: Not on file    Gets together: Not on file    Attends religious service: Not on file    Active member of club or organization: Not on file    Attends meetings of clubs or  organizations: Not on file    Relationship status: Not on file  Other Topics Concern  . Not on file  Social History Narrative  . Not on file   No Known Allergies History reviewed. No pertinent family history.  No family history of autoimmune   Past medical history, social, surgical and family history all reviewed in electronic medical record.  No pertanent information unless stated regarding to the chief complaint.   Review of Systems:Review of systems updated and as accurate as of 01/22/18  No headache, visual changes, nausea, vomiting, diarrhea, constipation, dizziness, abdominal pain, skin rash, fevers, chills, night sweats, weight loss, swollen lymph nodes, body aches, joint swelling, muscle aches, chest pain, shortness of breath, mood changes.   Objective  Blood pressure 124/80, pulse 77, height 5\' 1"  (1.549 m), weight 205 lb (93 kg), SpO2 97 %. Systems examined below as of 01/22/18   General: No apparent distress alert and oriented x3 mood and affect normal, dressed appropriately.  HEENT: Pupils equal, extraocular movements intact  Respiratory: Patient's speak in full sentences and does not appear short of breath  Cardiovascular: No lower extremity edema, non tender, no erythema  Skin: Warm dry intact  with no signs of infection or rash on extremities or on axial skeleton.  Abdomen: Soft nontender  Neuro: Cranial nerves II through XII are intact, neurovascularly intact in all extremities with 2+ DTRs and 2+ pulses.  Lymph: No lymphadenopathy of posterior or anterior cervical chain or axillae bilaterally.  Gait normal with good balance and coordination.  MSK:  Non tender with full range of motion and good stability and symmetric strength and tone of shoulders, elbows, wrist, hip, knee bilaterally.  Ankle: Left No visible erythema or swelling. Range of motion is full in all directions. Strength is 5/5 in all directions. Stable lateral and medial ligaments; squeeze test and  kleiger test unremarkable; Talar dome nontender; Pain at the base of the fifth metatarsal noted today.; No tenderness over cuboid; No tenderness over N spot or navicular prominence No tenderness on posterior aspects of lateral and medial malleolus No sign of peroneal tendon subluxations or tenderness to palpation Negative tarsal tunnel tinel's Able to walk 4 steps. Contralateral ankle unremarkable    Impression and Recommendations:     This case required medical decision making of moderate complexity.      Note: This dictation was prepared with Dragon dictation along with smaller phrase technology. Any transcriptional errors that result from this process are unintentional.

## 2018-01-22 ENCOUNTER — Encounter: Payer: Self-pay | Admitting: Family Medicine

## 2018-01-22 ENCOUNTER — Ambulatory Visit (INDEPENDENT_AMBULATORY_CARE_PROVIDER_SITE_OTHER): Payer: 59 | Admitting: Family Medicine

## 2018-01-22 DIAGNOSIS — S99192A Other physeal fracture of left metatarsal, initial encounter for closed fracture: Secondary | ICD-10-CM | POA: Diagnosis not present

## 2018-01-22 DIAGNOSIS — M7672 Peroneal tendinitis, left leg: Secondary | ICD-10-CM

## 2018-01-22 MED ORDER — VITAMIN D (ERGOCALCIFEROL) 1.25 MG (50000 UNIT) PO CAPS: 50000.0000 [IU] | ORAL_CAPSULE | ORAL | 0 refills | Status: DC

## 2018-01-22 NOTE — Assessment & Plan Note (Signed)
Small stress reaction.  Started once weekly vitamin D, postop boot given, follow-up again in 4 weeks

## 2018-01-22 NOTE — Assessment & Plan Note (Signed)
Improvement noted significantly.  Significant decrease in size of the cyst.  Patient is doing very well with conservative therapy.  Does have a new stress reaction of the fifth metatarsal.  Patient will continue all other medications.  Follow-up again in 4 weeks

## 2018-01-22 NOTE — Patient Instructions (Addendum)
Good to see you  Wear the cool new shoe daily for next 2 weeks at least  Once weekly vitamin D for 12 weeks and sent into the pharmacy  Stay active but monitor foot.  Ice every night See me again in 4 weeks for one more quick check

## 2018-01-23 ENCOUNTER — Other Ambulatory Visit: Payer: Self-pay | Admitting: Internal Medicine

## 2018-01-23 DIAGNOSIS — G609 Hereditary and idiopathic neuropathy, unspecified: Secondary | ICD-10-CM

## 2018-01-31 ENCOUNTER — Other Ambulatory Visit: Payer: Self-pay | Admitting: *Deleted

## 2018-01-31 MED ORDER — PREDNISONE 50 MG PO TABS: ORAL_TABLET | ORAL | 0 refills | Status: DC

## 2018-01-31 NOTE — Telephone Encounter (Signed)
Pt called stating that she is in a lot of pain with her foot & would like to know if there is anything she can take.   Per Dr. Katrinka BlazingSmith, okay to send in rx for Prednisone 50mg  #5

## 2018-02-17 NOTE — Progress Notes (Signed)
[name] [address] [place], [place] [address] Phone: [phone] Subjective:    I Ronelle Nigh am serving as a Neurosurgeon for Dr. Antoine Primas.   CC: Ankle pain  OZH:YQMVHQIONG  Joanna [name] is a 42 y.o. female coming in with complaint of ankle pain. States that her ankle is doing ok. Pain is better today but still has lateral pain.  Patient has been found to have peroneal subluxation as well as peroneal tendinitis.  Has been attempting all conservative therapy.  Patient feels that she is kind of plateaued.  Approximately 75% better but not completely resolving.     Past Medical History:  Diagnosis Date  . Arthritis   . Migraine    Past Surgical History:  Procedure Laterality Date  . ABDOMINAL HYSTERECTOMY    . BREAST SURGERY    . REDUCTION MAMMAPLASTY Bilateral 2001   Social History   Socioeconomic History  . Marital status: Legally Separated    Spouse name: Not on file  . Number of children: Not on file  . Years of education: Not on file  . Highest education level: Not on file  Occupational History  . Not on file  Social Needs  . Financial resource strain: Not on file  . Food insecurity:    Worry: Not on file    Inability: Not on file  . Transportation needs:    Medical: Not on file    Non-medical: Not on file  Tobacco Use  . Smoking status: Former Games developer  . Smokeless tobacco: Never Used  Substance and Sexual Activity  . Alcohol use: No  . Drug use: No  . Sexual activity: Yes    Birth control/protection: Surgical  Lifestyle  . Physical activity:    Days per week: Not on file    Minutes per session: Not on file  . Stress: Not on file  Relationships  . Social connections:    Talks on phone: Not on file    Gets together: Not on file    Attends religious service: Not on file    Active member of club or organization: Not on file    Attends meetings of clubs or organizations: Not on file    Relationship status: Not on  file  Other Topics Concern  . Not on file  Social History Narrative  . Not on file   No Known Allergies No family history on file.  Current Outpatient Medications (Endocrine & Metabolic):  .  predniSONE (DELTASONE) 50 MG tablet, Take 1 tablet (50 mg total) by mouth daily with breakfast. .  predniSONE (DELTASONE) 50 MG tablet, Take 1 tablet daily.    Current Outpatient Medications (Analgesics):  .  aspirin-acetaminophen-caffeine (EXCEDRIN MIGRAINE) 250-250-65 MG tablet, Take 1 tablet by mouth every 6 (six) hours as needed for headache.   Current Outpatient Medications (Other):  .  gabapentin (NEURONTIN) 300 MG capsule, Take 1-2 capsules (300-600 mg total) by mouth 3 (three) times daily. Please make an appointment for a physical an PAP smear Thanks .  Vitamin D, Ergocalciferol, (DRISDOL) 50000 units CAPS capsule, Take 1 capsule (50,000 Units total) by mouth every 7 (seven) days.    Past medical history, social, surgical and family history all reviewed in electronic medical record.  No pertanent information unless stated regarding to the chief complaint.   Review of Systems:  No headache, visual changes, nausea, vomiting, diarrhea, constipation, dizziness, abdominal pain, skin rash, fevers, chills, night sweats, weight loss, swollen lymph nodes,  body aches, joint swelling, muscle aches, chest pain, shortness of breath, mood changes.   Objective  Blood pressure 110/70, pulse 100, height 5\' 1"  (1.549 m), weight 204 lb (92.5 kg), SpO2 98 %.   General: No apparent distress alert and oriented x3 mood and affect normal, dressed appropriately.  HEENT: Pupils equal, extraocular movements intact  Respiratory: Patient's speak in full sentences and does not appear short of breath  Cardiovascular: No lower extremity edema, non tender, no erythema  Skin: Warm dry intact with no signs of infection or rash on extremities or on axial skeleton.  Abdomen: Soft nontender  Neuro: Cranial nerves II  through XII are intact, neurovascularly intact in all extremities with 2+ DTRs and 2+ pulses.  Lymph: No lymphadenopathy of posterior or anterior cervical chain or axillae bilaterally.  Gait normal with good balance and coordination.  MSK:  Non tender with full range of motion and good stability and symmetric strength and tone of shoulders, elbows, wrist, hip, knee bilaterally.  Left ankle exam still shows some swelling over the peroneal tendons.  Near full range of motion of the ankle.  Lipoma noted over the lateral aspect of the ankle.  Mild impingement of the anterior lateral mortise.  Patient does have 4+ out of 5 strength with resisted eversion on the left compared to the contralateral side otherwise symmetric strength.  Neurovascular intact distally and deep tendon reflexes intact  Procedure: Real-time Ultrasound Guided Injection of left peroneal tendon sheath Device: GE Logiq Q7 Ultrasound guided injection is preferred based studies that show increased duration, increased effect, greater accuracy, decreased procedural pain, increased response rate, and decreased cost with ultrasound guided versus blind injection.  Verbal informed consent obtained.  Time-out conducted.  Noted no overlying erythema, induration, or other signs of local infection.  Skin prepped in a sterile fashion.  Local anesthesia: Topical Ethyl chloride.  With sterile technique and under real time ultrasound guidance: With a 25-gauge half inch needle injected with 0.5 cc of 0.5% Marcaine and 0.5 cc of Kenalog 40 mg/mL into the tendon sheath Completed without difficulty  Pain immediately resolved suggesting accurate placement of the medication.  Advised to call if fevers/chills, erythema, induration, drainage, or persistent bleeding.  Images permanently stored and available for review in the ultrasound unit.  Impression: Technically successful ultrasound guided injection.   Impression and Recommendations:     This case  required medical decision making of moderate complexity. The above documentation has been reviewed and is accurate and complete Judi Saa, DO       Note: This dictation was prepared with Dragon dictation along with smaller phrase technology. Any transcriptional errors that result from this process are unintentional.

## 2018-02-19 ENCOUNTER — Ambulatory Visit (INDEPENDENT_AMBULATORY_CARE_PROVIDER_SITE_OTHER): Payer: 59 | Admitting: Family Medicine

## 2018-02-19 ENCOUNTER — Encounter: Payer: Self-pay | Admitting: Family Medicine

## 2018-02-19 ENCOUNTER — Other Ambulatory Visit: Payer: Self-pay

## 2018-02-19 ENCOUNTER — Ambulatory Visit: Payer: Self-pay

## 2018-02-19 VITALS — BP 120/78 | HR 75 | Temp 98.6°F | Wt 202.4 lb

## 2018-02-19 VITALS — BP 110/70 | HR 100 | Ht 61.0 in | Wt 204.0 lb

## 2018-02-19 DIAGNOSIS — Z23 Encounter for immunization: Secondary | ICD-10-CM

## 2018-02-19 DIAGNOSIS — M25572 Pain in left ankle and joints of left foot: Secondary | ICD-10-CM | POA: Diagnosis not present

## 2018-02-19 DIAGNOSIS — G8929 Other chronic pain: Secondary | ICD-10-CM

## 2018-02-19 DIAGNOSIS — M7672 Peroneal tendinitis, left leg: Secondary | ICD-10-CM | POA: Diagnosis not present

## 2018-02-19 DIAGNOSIS — R300 Dysuria: Secondary | ICD-10-CM

## 2018-02-19 DIAGNOSIS — Z114 Encounter for screening for human immunodeficiency virus [HIV]: Secondary | ICD-10-CM

## 2018-02-19 DIAGNOSIS — N644 Mastodynia: Secondary | ICD-10-CM

## 2018-02-19 DIAGNOSIS — R5383 Other fatigue: Secondary | ICD-10-CM

## 2018-02-19 DIAGNOSIS — R4586 Emotional lability: Secondary | ICD-10-CM

## 2018-02-19 LAB — POCT SEDIMENTATION RATE: POCT SED RATE: 3 mm/hr (ref 0–22)

## 2018-02-19 LAB — POCT URINALYSIS DIP (MANUAL ENTRY)
BILIRUBIN UA: NEGATIVE
BILIRUBIN UA: NEGATIVE mg/dL
GLUCOSE UA: NEGATIVE mg/dL
Leukocytes, UA: NEGATIVE
Nitrite, UA: NEGATIVE
PH UA: 6.5 (ref 5.0–8.0)
Protein Ur, POC: NEGATIVE mg/dL
RBC UA: NEGATIVE
Spec Grav, UA: 1.02 (ref 1.010–1.025)
Urobilinogen, UA: 1 E.U./dL

## 2018-02-19 NOTE — Patient Instructions (Signed)
Good to see you today!  Thanks for coming in.  For your muscle pain and fatigue  -Will check test for inflammation and thyroid  I will call you if your tests are not good.  Otherwise I will send you  a letter.  If you do not hear from me with in 2 weeks please call  our office.      - The best treatment is regular exercise - every day to at least  every other day for 20 minutes.  Remember the 5 min rule - even  if you feel really tired   - If you have any rashes, fever, or joint swelling let us know   For feeling down  If this is worsening and interfering with your life please let me  know

## 2018-02-19 NOTE — Assessment & Plan Note (Signed)
Seems mostly related to muscle pain chronic that may be fibromyalgia.  No red flags.  Check labs

## 2018-02-19 NOTE — Progress Notes (Signed)
Subjective  Joanna [name] is a 42 y.o. female is presenting with the following  Fatigue Muscle Pain For many years.   All over at major joints.  No deformity or redness or limitations or rash or fever or weight loss   Feeling Down On and off for years.  Seems to last about a day when feels down,  Does not prevent her from doing things and no persistent change in appetite or mood or activity  Dysuria Urine burns slightly and looks dark.  No fever or discharge or back pain  Breast Pain  Intermittently in R breast usually for months.  No mass or persistently tender area.  Has has bilateral breast surgery.  No fhx of breast cancer.  Pain seems to be like previous menstrual period pain but has had hysterectomy  Chief Complaint noted Review of Symptoms - see HPI PMH - Smoking status noted.    Objective Vital Signs reviewed BP 120/78   Pulse 75   Temp 98.6 F (37 C) (Oral)   Wt 202 lb 6.4 oz (91.8 kg)   SpO2 97%   BMI 38.24 kg/m  Neck:  No deformities, thyromegaly, masses, or tenderness noted.   Supple with full range of motion without pain. Heart - Regular rate and rhythm.  No murmurs, gallops or rubs.    Lungs:  Normal respiratory effort, chest expands symmetrically. Lungs are clear to auscultation, no crackles or wheezes. Abdomen: soft and non-tender without masses, organomegaly or hernias noted.  No guarding or rebound Extremities:  No cyanosis, edema, or deformity noted with good range of motion of all major joints.   Neurologic exam : Cn 2-7 intact Strength equal & normal in upper & lower extremities Able to walk on heels and toes.   Balance normal  able to get up and down from exam table without assistance Breast - has had bilateral breast reductions well healed.  Mildly tender area around 2 oclock in R breast.  No masses or adenopathy in either breast  Assessments/Plans  Normal exam  See AFTER VISIT SUMMARY  Fatigue Seems mostly related to muscle pain chronic that  may be fibromyalgia.  No red flags.  Check labs   Mood changes Chronic intermittent mild depressive moods.  Does not seem to interfere with function.  Monitor  Breast tenderness Seems related to menstrual periods.  No masses or signs of cancer.  Watch for warning signs

## 2018-02-19 NOTE — Patient Instructions (Signed)
Good to see you  Ice is your friend Stay active Exercises still 2-3 times a week See me again in 6 weeks if not better

## 2018-02-19 NOTE — Assessment & Plan Note (Signed)
Seems related to menstrual periods.  No masses or signs of cancer.  Watch for warning signs

## 2018-02-19 NOTE — Assessment & Plan Note (Signed)
Chronic intermittent mild depressive moods.  Does not seem to interfere with function.  Monitor

## 2018-02-20 ENCOUNTER — Encounter: Payer: Self-pay | Admitting: Family Medicine

## 2018-02-20 LAB — TSH: TSH: 2.11 u[IU]/mL (ref 0.450–4.500)

## 2018-02-20 LAB — HIV ANTIBODY (ROUTINE TESTING W REFLEX): HIV SCREEN 4TH GENERATION: NONREACTIVE

## 2018-02-20 NOTE — Assessment & Plan Note (Signed)
Patient given injection today.  Discussed the bracing, home exercise, icing regimen.  Patient does have an overlying lipoma in the area could be causing some mild impingement.  We will continue to monitor.  Follow-up again in 4 to 6 weeks

## 2018-02-28 ENCOUNTER — Other Ambulatory Visit: Payer: Self-pay | Admitting: Family Medicine

## 2018-02-28 DIAGNOSIS — G609 Hereditary and idiopathic neuropathy, unspecified: Secondary | ICD-10-CM

## 2018-03-08 ENCOUNTER — Telehealth: Payer: Self-pay

## 2018-03-08 DIAGNOSIS — G609 Hereditary and idiopathic neuropathy, unspecified: Secondary | ICD-10-CM

## 2018-03-08 MED ORDER — GABAPENTIN 300 MG PO CAPS: ORAL_CAPSULE | ORAL | 1 refills | Status: DC

## 2018-03-08 NOTE — Telephone Encounter (Signed)
Called and informed patient of new RX per Dr. Deirdre Priest. Patient informed me that she is in severe pain a lot and needs to stay on 2 pills 3 times a day. She is concerned about running out and then she would not be able to get out of bed or function.  Patient also states that she has had a hysterectomy and does not need a PAP smear.  Please advise.  Glennie Hawk, CMA

## 2018-03-08 NOTE — Telephone Encounter (Signed)
Patient left message that her Gabapentin was sent in with directions of one TID, she normally takes 2 TID.  Call back is [phone] or [phone]  Ples Specter, RN [facility] RN)

## 2018-03-08 NOTE — Telephone Encounter (Signed)
Please let her know  I sent in a new Rx for 1-2 three times a day which what she had been taking in the past from Dr Sampson Goon.  Also ask her if she has had a pap elsewhere and get the results or come in for a pap  Thanks

## 2018-03-08 NOTE — Telephone Encounter (Signed)
Opened in error

## 2018-04-08 ENCOUNTER — Other Ambulatory Visit: Payer: Self-pay | Admitting: Family Medicine

## 2018-04-08 DIAGNOSIS — G609 Hereditary and idiopathic neuropathy, unspecified: Secondary | ICD-10-CM

## 2018-04-10 ENCOUNTER — Ambulatory Visit (INDEPENDENT_AMBULATORY_CARE_PROVIDER_SITE_OTHER): Payer: 59 | Admitting: Family Medicine

## 2018-04-10 ENCOUNTER — Other Ambulatory Visit: Payer: Self-pay

## 2018-04-10 ENCOUNTER — Encounter: Payer: Self-pay | Admitting: Family Medicine

## 2018-04-10 VITALS — BP 140/80 | HR 79 | Temp 98.4°F | Ht 61.0 in | Wt 203.0 lb

## 2018-04-10 DIAGNOSIS — R319 Hematuria, unspecified: Secondary | ICD-10-CM

## 2018-04-10 DIAGNOSIS — N390 Urinary tract infection, site not specified: Secondary | ICD-10-CM | POA: Insufficient documentation

## 2018-04-10 DIAGNOSIS — R030 Elevated blood-pressure reading, without diagnosis of hypertension: Secondary | ICD-10-CM | POA: Diagnosis not present

## 2018-04-10 DIAGNOSIS — N3000 Acute cystitis without hematuria: Secondary | ICD-10-CM

## 2018-04-10 LAB — POCT URINALYSIS DIP (MANUAL ENTRY)
BILIRUBIN UA: NEGATIVE
Blood, UA: NEGATIVE
GLUCOSE UA: NEGATIVE mg/dL
Ketones, POC UA: NEGATIVE mg/dL
LEUKOCYTES UA: NEGATIVE
NITRITE UA: NEGATIVE
Protein Ur, POC: NEGATIVE mg/dL
Spec Grav, UA: 1.02 (ref 1.010–1.025)
Urobilinogen, UA: 0.2 E.U./dL
pH, UA: 6.5 (ref 5.0–8.0)

## 2018-04-10 MED ORDER — FLUCONAZOLE 150 MG PO TABS: 150.0000 mg | ORAL_TABLET | Freq: Every day | ORAL | 0 refills | Status: DC

## 2018-04-10 MED ORDER — CEPHALEXIN 500 MG PO CAPS: 500.0000 mg | ORAL_CAPSULE | Freq: Two times a day (BID) | ORAL | 0 refills | Status: AC

## 2018-04-10 NOTE — Progress Notes (Signed)
   Subjective:    Patient ID: Zack Seal, female    DOB: [birth year], 42 y.o.   MRN: [id number]   CC: pelvic pain/urinary sx    HPI: Ms Giambra is a 42 year old female presenting with a 2 day history of pelvic and back pain. She states around Monday she started having increased urinary urgency, associated with urinary frequency, hesistancy, and suprapubic pressure that radiated to her bilateral sides into her back. Denies any dysuria. She previously had these symptoms 1 month ago, took azo, and felt better. However this time she noted a drop of blood in her urine yesterday and decided to seek evaluation. Suprapubic pressure and radiating pain to her bilateral back is exacerbated during urination, cramping in nature. This feels like UTI's she's had in the past. She denies any vaginal itching, discharge (no uterus), or concern for STD's (not sexually active). Denies any fever, N/V/, change in BM, or bowel movements.    Smoking status reviewed  Review of Systems Per HPI, also denies recent illness, fever, headache, changes in vision, chest pain, shortness of breath, abdominal pain, N/V/D, weakness   Patient Active Problem List   Diagnosis Date Noted  . Single episode of elevated blood pressure 04/11/2018  . UTI (urinary tract infection) 04/10/2018  . Fatigue 02/19/2018  . Breast tenderness 02/19/2018  . Fracture of base of fifth metatarsal bone of left foot at metaphyseal-diaphyseal junction, closed, initial encounter 01/22/2018  . Peroneal tendinitis of left lower extremity 12/21/2017  . Fibromyalgia 08/23/2016  . Back pain 11/21/2012  . Generalized pain 03/19/2012  . Mood changes 05/11/2010  . MENOPAUSE, SURGICAL 05/11/2010  . SICKLE CELL TRAIT 08/09/2006  . MIGRAINE, UNSPEC., W/O INTRACTABLE MIGRAINE 08/09/2006     Objective:  BP 140/80   Pulse 79   Temp 98.4 F (36.9 C) (Oral)   Ht 5\' 1"  (1.549 m)   Wt 92.1 kg   SpO2 99%   BMI 38.36 kg/m  Vitals and nursing note  reviewed  General: NAD, pleasant Cardiac: RRR, normal heart sounds, no murmurs Respiratory: CTAB, normal effort Abdomen: soft, non-distended, normoactive BS, TTP suprapubic  Extremities: no edema or cyanosis. WWP. No CVA tenderness.  Skin: warm and dry, no rashes noted Neuro: alert and oriented, no focal deficits Psych: normal affect  Assessment & Plan:    UTI (urinary tract infection) U/A clear, however symptomatology consistent with UTI given urinary frequency, urgency, and suprapubic tenderness similar to her previous UTI's, and will treat as such at this time. No concern for pyelonephritis, denies F/C, or CVA tenderness.   -Sent for urine culture  -Keflex 500mg  BID for 7 days  -Given prescription for Diflucan, to take if vaginal yeast sx (patient requested)  -Return precautions if not improving or worsening sx   Single episode of elevated blood pressure 140/80 today, previous wnl during previous visits with no history of hypertension. Will continue to monitor.     Leticia Penna, DO Family Medicine Resident PGY-1

## 2018-04-10 NOTE — Assessment & Plan Note (Addendum)
U/A clear, however symptomatology consistent with UTI given urinary frequency, urgency, and suprapubic tenderness similar to her previous UTI's, and will treat as such at this time. No concern for pyelonephritis, denies F/C, or CVA tenderness.   -Sent for urine culture  -Keflex 500mg  BID for 7 days  -Given prescription for Diflucan, to take if vaginal yeast sx (patient requested)  -Return precautions if not improving or worsening sx

## 2018-04-10 NOTE — Patient Instructions (Signed)
So nice to meet you today! I have sent the antibiotic to the pharmacy, continue to take this twice daily for 7 days. Please return if your symptoms do not improve or worsen. Thank you!

## 2018-04-11 DIAGNOSIS — R030 Elevated blood-pressure reading, without diagnosis of hypertension: Secondary | ICD-10-CM | POA: Insufficient documentation

## 2018-04-11 NOTE — Assessment & Plan Note (Signed)
140/80 today, previous wnl during previous visits with no history of hypertension. Will continue to monitor.

## 2018-04-15 ENCOUNTER — Other Ambulatory Visit: Payer: Self-pay | Admitting: Family Medicine

## 2018-05-03 ENCOUNTER — Other Ambulatory Visit: Payer: Self-pay | Admitting: Family Medicine

## 2018-05-03 DIAGNOSIS — G609 Hereditary and idiopathic neuropathy, unspecified: Secondary | ICD-10-CM

## 2018-05-28 ENCOUNTER — Encounter: Payer: Self-pay | Admitting: Family Medicine

## 2018-05-28 ENCOUNTER — Other Ambulatory Visit: Payer: Self-pay

## 2018-05-28 ENCOUNTER — Ambulatory Visit (INDEPENDENT_AMBULATORY_CARE_PROVIDER_SITE_OTHER): Payer: 59 | Admitting: Family Medicine

## 2018-05-28 DIAGNOSIS — R4586 Emotional lability: Secondary | ICD-10-CM | POA: Diagnosis not present

## 2018-05-28 MED ORDER — SERTRALINE HCL 50 MG PO TABS: 50.0000 mg | ORAL_TABLET | Freq: Every day | ORAL | 3 refills | Status: DC

## 2018-05-28 NOTE — Assessment & Plan Note (Signed)
Seems consistent with dysthymia vs depression.  She wishes to pursue medication and counseling.  See after visit summary

## 2018-05-28 NOTE — Patient Instructions (Addendum)
Good to see you today!  Thanks for coming in.  I will send a note to [facility].  I would call tomorrow and make an appointment to see them.   You could also look at your insurance to see what counselors are covered   Start Zoloft 50 mg every morning.  If it makes you sleepy take it in the evening  If you start to feel worse - more down give me a call.  Come back in 2 weeks to see how you are feeling

## 2018-05-28 NOTE — Progress Notes (Signed)
Subjective  Joanna [name] is a 42 y.o. female is presenting with the following  MOOD Has been having episodes of feeling down for about 1-2 years.  Will happen unpredictably and last for days but not weeks.  Feels down, tired, sad and cries.  Can have EMA.  No suicidal ideation.   No episodes of feeling "too good" or significant financial or relationship indiscretions.   Never been treated for mood issues before No strong fhx of mood issues Drinks 1 glass of wine a week or so.  No other recreational drugs   Chief Complaint noted Review of Symptoms - see HPI PMH - Smoking status noted.    Objective Vital Signs reviewed BP 114/80   Pulse 76   Temp 98.7 F (37.1 C) (Oral)   Ht 5\' 1"  (1.549 m)   Wt 199 lb (90.3 kg)   SpO2 99%   BMI 37.60 kg/m  Psych:  Cognition and judgment appear intact. Alert, communicative  and cooperative with normal attention span and concentration. No apparent delusions, illusions, hallucinations  Assessments/Plans  See after visit summary for details of patient instuctions  Mood changes Seems consistent with dysthymia vs depression.  She wishes to pursue medication and counseling.  See after visit summary

## 2018-06-03 ENCOUNTER — Other Ambulatory Visit: Payer: Self-pay | Admitting: Family Medicine

## 2018-06-03 DIAGNOSIS — G609 Hereditary and idiopathic neuropathy, unspecified: Secondary | ICD-10-CM

## 2018-06-19 ENCOUNTER — Other Ambulatory Visit: Payer: Self-pay | Admitting: Family Medicine

## 2018-07-24 ENCOUNTER — Encounter: Payer: Self-pay | Admitting: Family Medicine

## 2018-07-24 ENCOUNTER — Ambulatory Visit (INDEPENDENT_AMBULATORY_CARE_PROVIDER_SITE_OTHER): Payer: 59 | Admitting: Family Medicine

## 2018-07-24 ENCOUNTER — Ambulatory Visit: Payer: Self-pay

## 2018-07-24 VITALS — BP 110/82 | HR 92 | Ht 61.0 in | Wt 203.0 lb

## 2018-07-24 DIAGNOSIS — G8929 Other chronic pain: Secondary | ICD-10-CM

## 2018-07-24 DIAGNOSIS — M25511 Pain in right shoulder: Principal | ICD-10-CM

## 2018-07-24 DIAGNOSIS — M75111 Incomplete rotator cuff tear or rupture of right shoulder, not specified as traumatic: Secondary | ICD-10-CM | POA: Diagnosis not present

## 2018-07-24 MED ORDER — MELOXICAM 15 MG PO TABS: 15.0000 mg | ORAL_TABLET | Freq: Every day | ORAL | 0 refills | Status: DC

## 2018-07-24 NOTE — Assessment & Plan Note (Signed)
Partial supraspinatus with patient having some mild retraction.  Discussed icing regimen and home exercise.  Patient declined injection.  Will try meloxicam exercises supplied by the athletic trainer.  Patient unable to do nitroglycerin secondary to history of headaches.  Follow-up again in 4 to 8 weeks

## 2018-07-24 NOTE — Progress Notes (Signed)
Joanna [name]  D.O. [facility] [address] [place], [place] [address] Phone: [phone] Subjective:    I Joanna NighKana [name] am serving as a Neurosurgeonscribe for Dr. Antoine PrimasZachary .    CC: Right shoulder pain  BJY:[id number]:Subjective  Joanna SealKimberly K [name] is a 43 y.o. female coming in with complaint of right shoulder pain. Right handed. States the shoulder catches. Pain radiates down the arm. 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  Onset- Chronic  Location- Shoulder joint  Duration-aching sensation almost daily character- sharp, dull Aggravating factors- flexion, driving  Therapies tried-icing regimen Severity-9 out of 10     Past Medical History:  Diagnosis Date  . Arthritis   . Migraine    Past Surgical History:  Procedure Laterality Date  . ABDOMINAL HYSTERECTOMY    . BREAST SURGERY    . REDUCTION MAMMAPLASTY Bilateral 2001   Social History   Socioeconomic History  . Marital status: Legally Separated    Spouse name: Not on file  . Number of children: Not on file  . Years of education: Not on file  . Highest education level: Not on file  Occupational History  . Not on file  Social Needs  . Financial resource strain: Not on file  . Food insecurity:    Worry: Not on file    Inability: Not on file  . Transportation needs:    Medical: Not on file    Non-medical: Not on file  Tobacco Use  . Smoking status: Former Games developermoker  . Smokeless tobacco: Never Used  Substance and Sexual Activity  . Alcohol use: No  . Drug use: No  . Sexual activity: Yes    Birth control/protection: Surgical  Lifestyle  . Physical activity:    Days per week: Not on file    Minutes per session: Not on file  . Stress: Not on file  Relationships  . Social connections:    Talks on phone: Not on file    Gets together: Not on file    Attends religious service: Not on file    Active member of club or organization: Not on file    Attends meetings of clubs or organizations: Not on file    Relationship  status: Not on file  Other Topics Concern  . Not on file  Social History Narrative  . Not on file   No Known Allergies No family history on file.  Family history of autoimmune disease     Current Outpatient Medications (Analgesics):  .  aspirin-acetaminophen-caffeine (EXCEDRIN MIGRAINE) 250-250-65 MG tablet, Take 1 tablet by mouth every 6 (six) hours as needed for headache.   Current Outpatient Medications (Other):  .  gabapentin (NEURONTIN) 300 MG capsule, TAKE 1 CAPSULE BY MOUTH THREE TIMES A DAY .  sertraline (ZOLOFT) 50 MG tablet, TAKE 1 TABLET BY MOUTH EVERY DAY .  Vitamin D, Ergocalciferol, (DRISDOL) 50000 units CAPS capsule, TAKE 1 CAPSULE (50,000 UNITS TOTAL) BY MOUTH EVERY 7 (SEVEN) DAYS.    Past medical history, social, surgical and family history all reviewed in electronic medical record.  No pertanent information unless stated regarding to the chief complaint.   Review of Systems:  No headache, visual changes, nausea, vomiting, diarrhea, constipation, dizziness, abdominal pain, skin rash, fevers, chills, night sweats, weight loss, swollen lymph nodes, body aches, joint swelling, muscle aches, chest pain, shortness of breath, mood changes.   Objective  There were no vitals taken for this visit. Systems examined below as of    General: No apparent  distress alert and oriented x3 mood and affect normal, dressed appropriately.  HEENT: Pupils equal, extraocular movements intact  Respiratory: Patient's speak in full sentences and does not appear short of breath  Cardiovascular: No lower extremity edema, non tender, no erythema  Skin: Warm dry intact with no signs of infection or rash on extremities or on axial skeleton.  Abdomen: Soft nontender  Neuro: Cranial nerves II through XII are intact, neurovascularly intact in all extremities with 2+ DTRs and 2+ pulses.  Lymph: No lymphadenopathy of posterior or anterior cervical chain or axillae bilaterally.  Gait normal with  good balance and coordination.  MSK:  Non tender with full range of motion and good stability and symmetric strength and tone of elbows, wrist, hip, knee and ankles bilaterally.  Shoulder: Right Inspection reveals no abnormalities, atrophy or asymmetry. Palpation is normal with no tenderness over AC joint or bicipital groove. ROM is full in all planes passively. Rotator cuff strength 4+ out of 5 but mildly compared to contralateral side. signs of impingement with positive Neer and Hawkin's tests, but negative empty can sign. Speeds and Yergason's tests normal. No labral pathology noted with negative Obrien's, negative clunk and good stability. Normal scapular function observed. No painful arc and no drop arm sign. No apprehension sign Contralateral shoulder unremarkable  MSK US performed of: Right This study was ordered, performed, and interpreted by Terrilee FilesZach  D.O.  Shoulder:   Supraspinatus: Small rotator cuff tear noted.  Partial retraction noted.  Bursal bulge seen with shoulder abduction on impingement view. Infraspinatus:  Appears normal on long and transverse views. Significant increase in Doppler flow Subscapularis:  Appears normal on long and transverse views. Positive bursa Teres Minor:  Appears normal on long and transverse views. AC joint: Mild arthritic changes Glenohumeral Joint:  Appears normal without effusion. Glenoid Labrum:  Intact without visualized tears. Biceps Tendon: Trace effusion noted  Impression: Small rotator cuff tear noted  97110; 15 additional minutes spent for Therapeutic exercises as stated in above notes.  This included exercises focusing on stretching, strengthening, with significant focus on eccentric aspects.   Long term goals include an improvement in range of motion, strength, endurance as well as avoiding reinjury. Patient's frequency would include in 1-2 times a day, 3-5 times a week for a duration of 6-12 weeks. Shoulder Exercises that  included:  Basic scapular stabilization to include adduction and depression of scapula Scaption, focusing on proper movement and good control Internal and External rotation utilizing a theraband, with elbow tucked at side entire time Rows with theraband which was given   Proper technique shown and discussed handout in great detail with ATC.  All questions were discussed and answered.     Impression and Recommendations:     This case required medical decision making of moderate complexity. The above documentation has been reviewed and is accurate and complete Judi SaaZachary M , Joanna [name]       Note: This dictation was prepared with Dragon dictation along with smaller phrase technology. Any transcriptional errors that result from this process are unintentional.

## 2018-07-24 NOTE — Patient Instructions (Addendum)
God to see you  I am sorry for bad news  Ice 20 minutes 2 times daily. Usually after activity and before bed. pennsaid pinkie amount topically 2 times daily as needed.  Exercises 3 times a week.  Keep hands within peripheral vison  meloxicma daily for 10 days then as needed. Stop if hurts your stomach  See me again in 4 weeks to make sure you are healing

## 2018-08-08 ENCOUNTER — Ambulatory Visit (INDEPENDENT_AMBULATORY_CARE_PROVIDER_SITE_OTHER): Payer: 59 | Admitting: Family Medicine

## 2018-08-08 DIAGNOSIS — R4586 Emotional lability: Secondary | ICD-10-CM | POA: Diagnosis not present

## 2018-08-08 NOTE — Patient Instructions (Signed)
It was a pleasure to see you today! Thank you for choosing [facility] for your primary care. Joanna [name] was seen for cold symptoms.   Our plans for today were:  You don't have signs of the flu or strep. I think you have a cold virus, that will resolve on it's own. The typical duration of cold sxs is 5-7 days. The cough will be the last symptom to improve. Use frequent hand washing to avoid spreading germs.   For your mood medicine, please don't take this off and on. If you would like to take it, please try taking it at night for decreased GI symptoms.   Best,  Dr. Chanetta Marshall

## 2018-08-08 NOTE — Progress Notes (Signed)
   CC: congestion, cough  HPI  Started at 4pm yesterday, had some chest tightness right in the middle, felt like this was congestion. She coughed somet his morning but no sputum. 2 loose BMs this morning. Didn't try anything yesterday, tried APAP this morning. Didn't sleep well last night. She was pushing fluids. She works at [employer], where there have been lots of recent viruses. Lately flu positive and she did have the stomach bug a few weeks ago. Felt a little naseous this morning but no emesis. No dysuria or frequency.   On med review, she noted she was taking zoloft PRN, she did feel a little naseous with this in the morning in the past. Not currently in therapy.   ROS: Denies CP, SOB, abdominal pain, dysuria, changes in BMs.   CC, SH/smoking status, and VS noted  Objective: BP 119/80   Pulse 80   Temp 98.4 F (36.9 C)   Wt 200 lb 9.6 oz (91 kg)   SpO2 97%   BMI 37.90 kg/m  Gen: NAD, alert, cooperative, and pleasant. HEENT: NCAT, EOMI, PERRL, TMs clear bilaterally with mildly erythematous oropharynx midline uvula.  CV: RRR, no murmur Resp: CTAB, no wheezes, non-labored Ext: No edema, warm Neuro: Alert and oriented, Speech clear, No gross deficits  Assessment and plan:  Mood changes Patient had been taking her Zoloft as needed on a brief med review.  We did not in-depth discuss a plan for her mood symptoms or treatment, but I did explain that SSRIs are not meant for as needed use and there is an increased risk of withdrawal symptoms or side effects if taken off and on.  I counseled her that if she continues to think this is a good choice for her mood, she should try taking the Zoloft at bedtime to decrease GI side effects as Dr. Deirdre Priest had noted at their last visit.  She knew that she is overdue for a follow-up visit with Dr. tubeless to discuss her mood, and she will schedule this on the way out.   Viral URI: Patient with symptoms consistent with viral URI, no signs of  ear infection or increased work of breathing to suggest nefarious lung pathology.  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  She has not had any fever, which makes my suspicion for flu much lower.  I did offer her a flu test in case she needs to be written out of work if this was positive.  She declined.  We discussed that even if it was positive Tamiflu is not indicated for an otherwise healthy 43 year old.  She will continue symptomatic treatment with Tylenol.  Return if worsening.  I did write her out of work for today and possibly tomorrow if she has continued or worsening symptoms.   Loni Muse, MD, PGY3 08/08/2018 11:04 AM

## 2018-08-08 NOTE — Assessment & Plan Note (Signed)
Patient had been taking her Zoloft as needed on a brief med review.  We did not in-depth discuss a plan for her mood symptoms or treatment, but I did explain that SSRIs are not meant for as needed use and there is an increased risk of withdrawal symptoms or side effects if taken off and on.  I counseled her that if she continues to think this is a good choice for her mood, she should try taking the Zoloft at bedtime to decrease GI side effects as Dr. Deirdre Priest had noted at their last visit.  She knew that she is overdue for a follow-up visit with Dr. tubeless to discuss her mood, and she will schedule this on the way out.

## 2018-08-16 ENCOUNTER — Other Ambulatory Visit: Payer: Self-pay | Admitting: Family Medicine

## 2018-08-17 ENCOUNTER — Other Ambulatory Visit: Payer: Self-pay | Admitting: Family Medicine

## 2018-08-17 DIAGNOSIS — G609 Hereditary and idiopathic neuropathy, unspecified: Secondary | ICD-10-CM

## 2018-08-21 NOTE — Progress Notes (Signed)
[name] [address] [place], [place] [address] Phone: [phone] Subjective:   Joanna [name], am serving as a scribe for Dr. Antoine [name].  I'm seeing this patient by the request  of:    CC: Shoulder pain follow-up  JIR:[id number]   07/24/2018: Partial supraspinatus with patient having some mild retraction.  Discussed icing regimen and home exercise.  Patient declined injection.  Will try meloxicam exercises supplied by the athletic trainer.  Patient unable to do nitroglycerin secondary to history of headaches.  Follow-up again in 4 to 8 weeks  Update 08/22/2018: Joanna [name] is a 43 y.o. female coming in with complaint of right shoulder pain. Patient states that she has had improvement since last visit. Is doing exercises 3-4 times a week. Also using meloxicam daily. Does have achy pain in shoulder occasionally.  Patient states 80% better at this time.    Past Medical History:  Diagnosis Date  . Arthritis   . Migraine    Past Surgical History:  Procedure Laterality Date  . ABDOMINAL HYSTERECTOMY    . BREAST SURGERY    . REDUCTION MAMMAPLASTY Bilateral 2001   Social History   Socioeconomic History  . Marital status: Legally Separated    Spouse name: Not on file  . Number of children: Not on file  . Years of education: Not on file  . Highest education level: Not on file  Occupational History  . Not on file  Social Needs  . Financial resource strain: Not on file  . Food insecurity:    Worry: Not on file    Inability: Not on file  . Transportation needs:    Medical: Not on file    Non-medical: Not on file  Tobacco Use  . Smoking status: Former Games developer  . Smokeless tobacco: Never Used  Substance and Sexual Activity  . Alcohol use: No  . Drug use: No  . Sexual activity: Yes    Birth control/protection: Surgical  Lifestyle  . Physical activity:    Days per week: Not on file    Minutes per session: Not on file  . Stress: Not  on file  Relationships  . Social connections:    Talks on phone: Not on file    Gets together: Not on file    Attends religious service: Not on file    Active member of club or organization: Not on file    Attends meetings of clubs or organizations: Not on file    Relationship status: Not on file  Other Topics Concern  . Not on file  Social History Narrative  . Not on file   No Known Allergies History reviewed. No pertinent family history.     Current Outpatient Medications (Analgesics):  .  aspirin-acetaminophen-caffeine (EXCEDRIN MIGRAINE) 250-250-65 MG tablet, Take 1 tablet by mouth every 6 (six) hours as needed for headache. .  meloxicam (MOBIC) 15 MG tablet, TAKE 1 TABLET BY MOUTH EVERY DAY   Current Outpatient Medications (Other):  .  gabapentin (NEURONTIN) 300 MG capsule, TAKE 1-2 CAPSULES 3 TIMES A DAY .  sertraline (ZOLOFT) 50 MG tablet, TAKE 1 TABLET BY MOUTH EVERY DAY .  Vitamin D, Ergocalciferol, (DRISDOL) 50000 units CAPS capsule, TAKE 1 CAPSULE (50,000 UNITS TOTAL) BY MOUTH EVERY 7 (SEVEN) DAYS.    Past medical history, social, surgical and family history all reviewed in electronic medical record.  No pertanent information unless stated regarding to the chief complaint.   Review of Systems:  No headache, visual changes, nausea, vomiting, diarrhea, constipation, dizziness, abdominal pain, skin rash, fevers, chills, night sweats, weight loss, swollen lymph nodes, body aches, joint swelling, muscle aches, chest pain, shortness of breath, mood changes.   Objective  Blood pressure 118/88, pulse 86, height 5\' 1"  (1.549 m), weight 205 lb (93 kg), SpO2 98 %.   General: No apparent distress alert and oriented x3 mood and affect normal, dressed appropriately.  HEENT: Pupils equal, extraocular movements intact  Respiratory: Patient's speak in full sentences and does not appear short of breath  Cardiovascular: No lower extremity edema, non tender, no erythema  Skin: Warm  dry intact with no signs of infection or rash on extremities or on axial skeleton.  Abdomen: Soft nontender  Neuro: Cranial nerves II through XII are intact, neurovascularly intact in all extremities with 2+ DTRs and 2+ pulses.  Lymph: No lymphadenopathy of posterior or anterior cervical chain or axillae bilaterally.  Gait normal with good balance and coordination.  MSK:  Non tender with full range of motion and good stability and symmetric strength and tone of  elbows, wrist, hip, knee and ankles bilaterally.  Shoulder: Right Inspection reveals no abnormalities, atrophy or asymmetry. Palpation is normal with no tenderness over AC joint or bicipital groove. ROM is full in all planes. Rotator cuff strength normal throughout. Mild impingement Speeds and Yergason's tests normal. No labral pathology noted with negative Obrien's, negative clunk and good stability. Normal scapular function observed. No painful arc and no drop arm sign. No apprehension sign Contralateral shoulder unremarkable    Impression and Recommendations:     . The above documentation has been reviewed and is accurate and complete Joanna Saa, DO       Note: This dictation was prepared with Dragon dictation along with smaller phrase technology. Any transcriptional errors that result from this process are unintentional.

## 2018-08-22 ENCOUNTER — Other Ambulatory Visit: Payer: Self-pay

## 2018-08-22 ENCOUNTER — Encounter: Payer: Self-pay | Admitting: Family Medicine

## 2018-08-22 ENCOUNTER — Ambulatory Visit (INDEPENDENT_AMBULATORY_CARE_PROVIDER_SITE_OTHER): Payer: 59 | Admitting: Family Medicine

## 2018-08-22 DIAGNOSIS — M75111 Incomplete rotator cuff tear or rupture of right shoulder, not specified as traumatic: Secondary | ICD-10-CM

## 2018-08-22 NOTE — Assessment & Plan Note (Signed)
Patient seems to be improving at this time.  Improvement in the strength at the moment.  Would like patient to continue with conservative therapy.  Worsening symptoms will consider injection which patient has declined.  Patient also declined any formal physical therapy.  Continue the vitamin D.  Follow-up with me again in 8 weeks

## 2018-08-22 NOTE — Patient Instructions (Signed)
Good to see you  Joanna [name] is your friend Keep trucking along Have an appointment in the next 2 months if not perfect and then we will do an injection

## 2018-09-14 ENCOUNTER — Other Ambulatory Visit: Payer: Self-pay | Admitting: Family Medicine

## 2018-10-03 ENCOUNTER — Telehealth: Payer: Self-pay | Admitting: Family Medicine

## 2018-10-03 ENCOUNTER — Other Ambulatory Visit: Payer: Self-pay | Admitting: Family Medicine

## 2018-10-03 DIAGNOSIS — G609 Hereditary and idiopathic neuropathy, unspecified: Secondary | ICD-10-CM

## 2018-10-03 NOTE — Telephone Encounter (Signed)
Called to check in since not seen for several months after starting SSRI She feels very well.  No depression and is handling the Covid stress well  She will make an appointment sometime in the future or call if worsening

## 2018-10-26 ENCOUNTER — Other Ambulatory Visit: Payer: Self-pay | Admitting: Family Medicine

## 2018-11-15 ENCOUNTER — Ambulatory Visit (INDEPENDENT_AMBULATORY_CARE_PROVIDER_SITE_OTHER): Payer: 59 | Admitting: Family Medicine

## 2018-11-15 ENCOUNTER — Other Ambulatory Visit: Payer: Self-pay

## 2018-11-15 ENCOUNTER — Ambulatory Visit: Payer: Self-pay

## 2018-11-15 ENCOUNTER — Encounter: Payer: Self-pay | Admitting: Family Medicine

## 2018-11-15 VITALS — BP 108/86 | HR 86 | Ht 61.0 in | Wt 208.0 lb

## 2018-11-15 DIAGNOSIS — M75111 Incomplete rotator cuff tear or rupture of right shoulder, not specified as traumatic: Secondary | ICD-10-CM

## 2018-11-15 DIAGNOSIS — M25511 Pain in right shoulder: Secondary | ICD-10-CM

## 2018-11-15 DIAGNOSIS — G8929 Other chronic pain: Secondary | ICD-10-CM | POA: Diagnosis not present

## 2018-11-15 NOTE — Progress Notes (Signed)
[name] [address] [place], [place] [address] Phone: [phone] Subjective:   [name], am serving as a scribe for Dr. Antoine Primas.  I'm seeing this patient by the request  of:    CC: Right shoulder pain follow-up  MVE:[id number]   08/22/2018: Patient seems to be improving at this time.  Improvement in the strength at the moment.  Would like patient to continue with conservative therapy.  Worsening symptoms will consider injection which patient has declined.  Patient also declined any formal physical therapy.  Continue the vitamin D.  Follow-up with me again in 8 weeks  Update 11/15/2018: Joanna [name] is a 43 y.o. female coming in with complaint of right shoulder pain. Patient states that she is constantly lifting and shoulder pain has becoming bad enough where she would like to get an injection. Does use a pillow at night.  Patient was seen previously and did have more of a rotator cuff tear.  Patient feels that the range of motion and strength is help but unfortunately the pain continues to be fairly severe.  Past Medical History:  Diagnosis Date  . Arthritis   . Migraine    Past Surgical History:  Procedure Laterality Date  . ABDOMINAL HYSTERECTOMY    . BREAST SURGERY    . REDUCTION MAMMAPLASTY Bilateral 2001   Social History   Socioeconomic History  . Marital status: Legally Separated    Spouse name: Not on file  . Number of children: Not on file  . Years of education: Not on file  . Highest education level: Not on file  Occupational History  . Not on file  Social Needs  . Financial resource strain: Not on file  . Food insecurity:    Worry: Not on file    Inability: Not on file  . Transportation needs:    Medical: Not on file    Non-medical: Not on file  Tobacco Use  . Smoking status: Former Games developer  . Smokeless tobacco: Never Used  Substance and Sexual Activity  . Alcohol use: No  . Drug use: No  . Sexual  activity: Yes    Birth control/protection: Surgical  Lifestyle  . Physical activity:    Days per week: Not on file    Minutes per session: Not on file  . Stress: Not on file  Relationships  . Social connections:    Talks on phone: Not on file    Gets together: Not on file    Attends religious service: Not on file    Active member of club or organization: Not on file    Attends meetings of clubs or organizations: Not on file    Relationship status: Not on file  Other Topics Concern  . Not on file  Social History Narrative  . Not on file   No Known Allergies No family history on file.     Current Outpatient Medications (Analgesics):  .  aspirin-acetaminophen-caffeine (EXCEDRIN MIGRAINE) 250-250-65 MG tablet, Take 1 tablet by mouth every 6 (six) hours as needed for headache. .  meloxicam (MOBIC) 15 MG tablet, TAKE 1 TABLET BY MOUTH EVERY DAY   Current Outpatient Medications (Other):  .  gabapentin (NEURONTIN) 300 MG capsule, TAKE 1-2 CAPSULES 3 TIMES A DAY .  sertraline (ZOLOFT) 50 MG tablet, TAKE 1 TABLET BY MOUTH EVERY DAY .  Vitamin D, Ergocalciferol, (DRISDOL) 50000 units CAPS capsule, TAKE 1 CAPSULE (50,000 UNITS TOTAL) BY MOUTH EVERY 7 (SEVEN) DAYS.  Past medical history, social, surgical and family history all reviewed in electronic medical record.  No pertanent information unless stated regarding to the chief complaint.   Review of Systems:  No headache, visual changes, nausea, vomiting, diarrhea, constipation, dizziness, abdominal pain, skin rash, fevers, chills, night sweats, weight loss, swollen lymph nodes, body aches, joint swelling, muscle aches, chest pain, shortness of breath, mood changes.   Objective  There were no vitals taken for this visit. Systems examined below as of    General: No apparent distress alert and oriented x3 mood and affect normal, dressed appropriately.  HEENT: Pupils equal, extraocular movements intact  Respiratory: Patient's speak  in full sentences and does not appear short of breath  Cardiovascular: No lower extremity edema, non tender, no erythema  Skin: Warm dry intact with no signs of infection or rash on extremities or on axial skeleton.  Abdomen: Soft nontender  Neuro: Cranial nerves II through XII are intact, neurovascularly intact in all extremities with 2+ DTRs and 2+ pulses.  Lymph: No lymphadenopathy of posterior or anterior cervical chain or axillae bilaterally.  Gait normal with good balance and coordination.  MSK:  Non tender with full range of motion and good stability and symmetric strength and tone of , elbows, wrist, hip, knee and ankles bilaterally.  Patient's right shoulder still shows positive impingement.  Improvement in strength but 4+ out of 5 compared to the contralateral side.  Procedure: Real-time Ultrasound Guided Injection of right glenohumeral joint Device: GE Logiq Q7  Ultrasound guided injection is preferred based studies that show increased duration, increased effect, greater accuracy, decreased procedural pain, increased response rate with ultrasound guided versus blind injection.  Verbal informed consent obtained.  Time-out conducted.  Noted no overlying erythema, induration, or other signs of local infection.  Skin prepped in a sterile fashion.  Local anesthesia: Topical Ethyl chloride.  With sterile technique and under real time ultrasound guidance:  Joint visualized.  23g 1  inch needle inserted posterior approach. Pictures taken for needle placement. Patient did have injection of 2 cc of 1% lidocaine, 2 cc of 0.5% Marcaine, and 1.0 cc of Kenalog 40 mg/dL. Completed without difficulty  Pain immediately resolved suggesting accurate placement of the medication.  Advised to call if fevers/chills, erythema, induration, drainage, or persistent bleeding.  Images permanently stored and available for review in the ultrasound unit.  Impression: Technically successful ultrasound guided  injection.    Impression and Recommendations:     This case required medical decision making of moderate complexity. The above documentation has been reviewed and is accurate and complete Judi SaaZachary M Zandyr Barnhill, DO       Note: This dictation was prepared with Dragon dictation along with smaller phrase technology. Any transcriptional errors that result from this process are unintentional.

## 2018-11-15 NOTE — Assessment & Plan Note (Signed)
Patient given injection and tolerated the procedure well.  Discussed icing regimen and home exercises.  Discussed which activities to do which wants to avoid.  Patient is to increase activity slowly.  No heavy lifting.  Follow-up again in 4 to 6 weeks

## 2018-11-15 NOTE — Patient Instructions (Signed)
Follow 6-8 weeks

## 2018-12-03 ENCOUNTER — Other Ambulatory Visit: Payer: Self-pay | Admitting: Family Medicine

## 2018-12-25 ENCOUNTER — Other Ambulatory Visit: Payer: Self-pay | Admitting: Family Medicine

## 2018-12-25 DIAGNOSIS — G609 Hereditary and idiopathic neuropathy, unspecified: Secondary | ICD-10-CM

## 2018-12-26 ENCOUNTER — Telehealth: Payer: Self-pay | Admitting: *Deleted

## 2018-12-26 NOTE — Telephone Encounter (Signed)
LMOVM for pt to call back and schedule an appt. Nataliah Hatlestad, CMA  

## 2018-12-26 NOTE — Telephone Encounter (Signed)
-----   Message from Lind Covert, MD sent at 12/25/2018 11:35 AM EDT ----- Regarding: FW: Visit  ----- Message ----- From: Lind Covert, MD Sent: 12/25/2018  11:17 AM EDT To: Lind Covert, MD Subject: Visit                                          Please contact her and ask to schedule and in person or virtual visit  Thanks  Truman Hayward

## 2018-12-27 ENCOUNTER — Ambulatory Visit (INDEPENDENT_AMBULATORY_CARE_PROVIDER_SITE_OTHER): Payer: 59 | Admitting: Family Medicine

## 2018-12-27 ENCOUNTER — Other Ambulatory Visit: Payer: Self-pay

## 2018-12-27 ENCOUNTER — Encounter: Payer: Self-pay | Admitting: Family Medicine

## 2018-12-27 DIAGNOSIS — M75111 Incomplete rotator cuff tear or rupture of right shoulder, not specified as traumatic: Secondary | ICD-10-CM | POA: Diagnosis not present

## 2018-12-27 NOTE — Patient Instructions (Signed)
Meloxicam 3 days when needed Continue exercises intermittently  See me when you need me

## 2018-12-27 NOTE — Assessment & Plan Note (Signed)
Patient is doing much better at this time.  Discussed icing regimen and home exercises.  Patient will continue to watch the range of motion exercises.  We discussed meloxicam bursts of 3 days at a time.  We will continue vitamin D.  Follow-up with me again as needed.

## 2018-12-27 NOTE — Progress Notes (Signed)
Joanna [name] D.O. [facility] [address] [place], [place] [address] Phone: [phone] Subjective:   Joanna [name], Joanna [name], am serving as a scribe for Dr. Antoine PrimasZachary [name].   CC: Shoulder pain,  BJY:[id number]:Subjective   11/15/2018: Patient given injection and tolerated the procedure well.  Discussed icing regimen and home exercises.  Discussed which activities to do which wants to avoid.  Patient is to increase activity slowly.  No heavy lifting.  Follow-up again in 4 to 6 weeks  Update 12/27/2018: Joanna SealKimberly K [name] is a 43 y.o. female coming in with complaint of right shoulder pain. Patient states that she had improvement after injection. Pain [facility].  Otherwise patient feels 95% better.  Doing the exercises intermittently.  Only uses meloxicam regularly    Past Medical History:  Diagnosis Date  . Arthritis   . Migraine    Past Surgical History:  Procedure Laterality Date  . ABDOMINAL HYSTERECTOMY    . BREAST SURGERY    . REDUCTION MAMMAPLASTY Bilateral 2001   Social History   Socioeconomic History  . Marital status: Legally Separated    Spouse name: Not on file  . Number of children: Not on file  . Years of education: Not on file  . Highest education level: Not on file  Occupational History  . Not on file  Social Needs  . Financial resource strain: Not on file  . Food insecurity    Worry: Not on file    Inability: Not on file  . Transportation needs    Medical: Not on file    Non-medical: Not on file  Tobacco Use  . Smoking status: Former Games developermoker  . Smokeless tobacco: Never Used  Substance and Sexual Activity  . Alcohol use: No  . Drug use: No  . Sexual activity: Yes    Birth control/protection: Surgical  Lifestyle  . Physical activity    Days per week: Not on file    Minutes per session: Not on file  . Stress: Not on file  Relationships  . Social Musicianconnections    Talks on phone: Not on file    Gets together: Not on file    Attends  religious service: Not on file    Active member of club or organization: Not on file    Attends meetings of clubs or organizations: Not on file    Relationship status: Not on file  Other Topics Concern  . Not on file  Social History Narrative  . Not on file   No Known Allergies No family history on file.     Current Outpatient Medications (Analgesics):  .  aspirin-acetaminophen-caffeine (EXCEDRIN MIGRAINE) 250-250-65 MG tablet, Take 1 tablet by mouth every 6 (six) hours as needed for headache. .  meloxicam (MOBIC) 15 MG tablet, TAKE 1 TABLET BY MOUTH EVERY DAY   Current Outpatient Medications (Other):  .  gabapentin (NEURONTIN) 300 MG capsule, TAKE 1-2 CAPSULES 3 TIMES A DAY .  sertraline (ZOLOFT) 50 MG tablet, TAKE 1 TABLET BY MOUTH EVERY DAY .  Vitamin D, Ergocalciferol, (DRISDOL) 50000 units CAPS capsule, TAKE 1 CAPSULE (50,000 UNITS TOTAL) BY MOUTH EVERY 7 (SEVEN) DAYS.    Past medical history, social, surgical and family history all reviewed in electronic medical record.  No pertanent information unless stated regarding to the chief complaint.   Review of Systems:  No headache, visual changes, nausea, vomiting, diarrhea, constipation, dizziness, abdominal pain, skin rash, fevers, chills, night sweats, weight loss, swollen lymph nodes,  body aches, joint swelling, muscle aches, chest pain, shortness of breath, mood changes.   Objective  There were no vitals taken for this visit. Systems examined below as of    General: No apparent distress alert and oriented x3 mood and affect normal, dressed appropriately.  HEENT: Pupils equal, extraocular movements intact  Respiratory: Patient's speak in full sentences and does not appear short of breath  Cardiovascular: No lower extremity edema, non tender, no erythema  Skin: Warm dry intact with no signs of infection or rash on extremities or on axial skeleton.  Abdomen: Soft nontender  Neuro: Cranial nerves II through XII are intact,  neurovascularly intact in all extremities with 2+ DTRs and 2+ pulses.  Lymph: No lymphadenopathy of posterior or anterior cervical chain or axillae bilaterally.  Gait normal with good balance and coordination.  MSK:  Non tender with full range of motion and good stability and symmetric strength and tone of , elbows, wrist, hip, knee and ankles bilaterally.  Shoulder:right  Inspection reveals no abnormalities, atrophy or asymmetry. Palpation is normal with no tenderness over AC joint or bicipital groove. ROM is full in all planes. Rotator cuff strength normal throughout. No signs of impingement with negative Neer and Hawkin's tests, empty can sign. Speeds and Yergason's tests normal. No labral pathology noted with negative Obrien's, negative clunk and good stability. Normal scapular function observed. No painful arc and no drop arm sign. No apprehension sign Contralateral shoulder unremarkable     Impression and Recommendations:     . The above documentation has been reviewed and is accurate and complete Lyndal Pulley, DO       Note: This dictation was prepared with Dragon dictation along with smaller phrase technology. Any transcriptional errors that result from this process are unintentional.

## 2019-01-07 ENCOUNTER — Encounter: Payer: Self-pay | Admitting: Family Medicine

## 2019-01-07 ENCOUNTER — Other Ambulatory Visit: Payer: Self-pay

## 2019-01-07 ENCOUNTER — Ambulatory Visit (INDEPENDENT_AMBULATORY_CARE_PROVIDER_SITE_OTHER): Payer: 59 | Admitting: Family Medicine

## 2019-01-07 DIAGNOSIS — M797 Fibromyalgia: Secondary | ICD-10-CM

## 2019-01-07 DIAGNOSIS — R4586 Emotional lability: Secondary | ICD-10-CM | POA: Diagnosis not present

## 2019-01-07 NOTE — Assessment & Plan Note (Signed)
Improved.  No longer taking zoloft.  Managing stress with activity

## 2019-01-07 NOTE — Progress Notes (Signed)
Subjective  Joanna [name] is a 43 y.o. female is presenting with the following  MOOD Feels well with no depressive symptoms or fatigue.  Has intermittent feelings of anxiety but manges these with activity.  Has not taken zoloft for months  FIBROMYALGIA Managed well with gabapentin.  Keeps active.  No limitations at work or exercise or home   Chief Complaint noted Review of Symptoms - see HPI PMH - Smoking status noted.    Objective Vital Signs reviewed BP 115/75   Pulse 74   Temp 98.1 F (36.7 C) (Oral)   Wt 200 lb (90.7 kg)   SpO2 99%   BMI 37.79 kg/m  Heart - Regular rate and rhythm.  No murmurs, gallops or rubs.    Lungs:  Normal respiratory effort, chest expands symmetrically. Lungs are clear to auscultation, no crackles or wheezes. Extremities:  No cyanosis, edema, or deformity noted with good range of motion of all major joints.   Mobility:able to get up and down from exam table without assistance or distress  Assessments/Plans  See after visit summary for details of patient instuctions  Fibromyalgia Stable on gabapentin.  Losing weight by eating better   Mood changes Improved.  No longer taking zoloft.  Managing stress with activity

## 2019-01-07 NOTE — Patient Instructions (Signed)
Good to see you today!  Thanks for coming in.  Let me know with any problems  When you want to have a cholesterol blood test let me know And I will order it

## 2019-01-07 NOTE — Assessment & Plan Note (Signed)
Stable on gabapentin.  Losing weight by eating better

## 2019-03-30 IMAGING — CT CT RENAL STONE PROTOCOL
2 of 3 series · 16 of 46 positions shown, 18 images · non-contrast
Comparison: None

CLINICAL DATA: Progressive BILATERAL flank pain and aching worse
after urination, urinary frequency, chronic garlic odor to urine,
intermittent nausea, onset of symptoms [REDACTED]

EXAM:
CT ABDOMEN AND PELVIS WITHOUT CONTRAST
TECHNIQUE: Multidetector CT imaging of the abdomen and pelvis was performed
following the standard protocol without IV contrast. Sagittal and
coronal MPR images reconstructed from axial data set. No oral
contrast administered.

[Series 4: coronal · coronal · 0.74mm/px · 3 of 190 slices shown]
[im 64/190  soft-tissue]
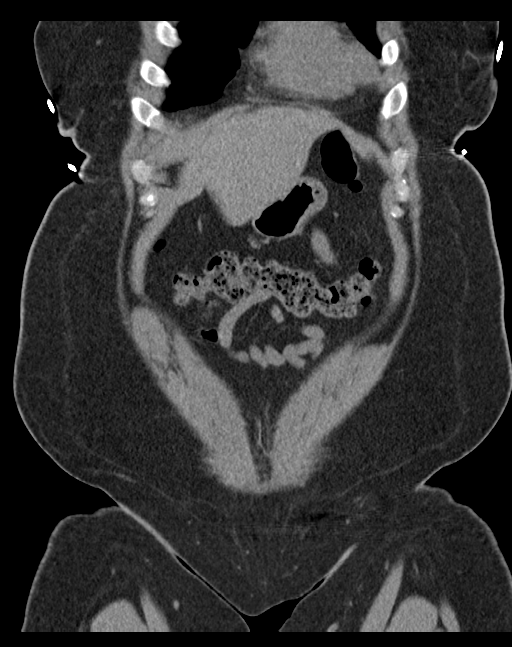
[im 85/190  soft-tissue]
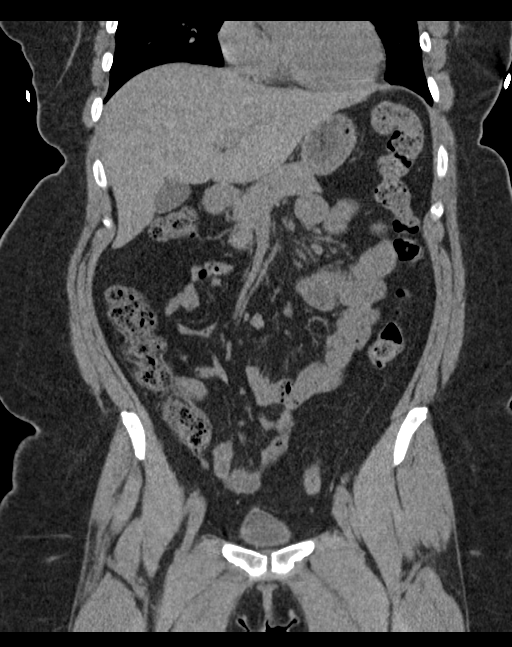
[im 106/190  soft-tissue]
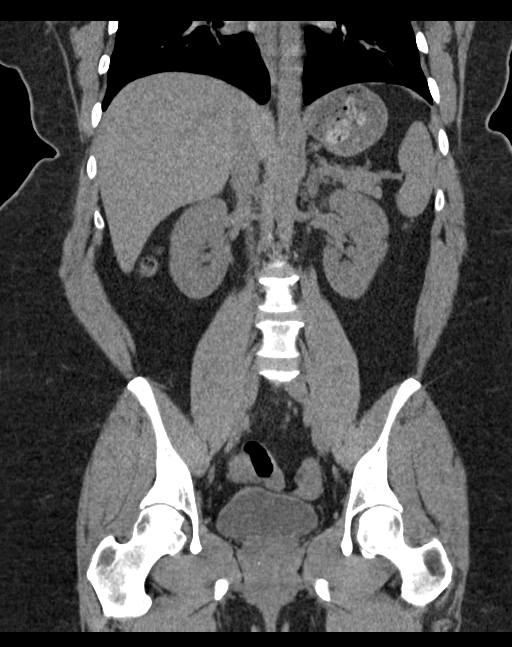

[Series 7: lung · axial · 0.91mm/px · z∈[+92,+198]mm · 13 of 61 slices shown, 15 images]
[im 4/61  soft-tissue]
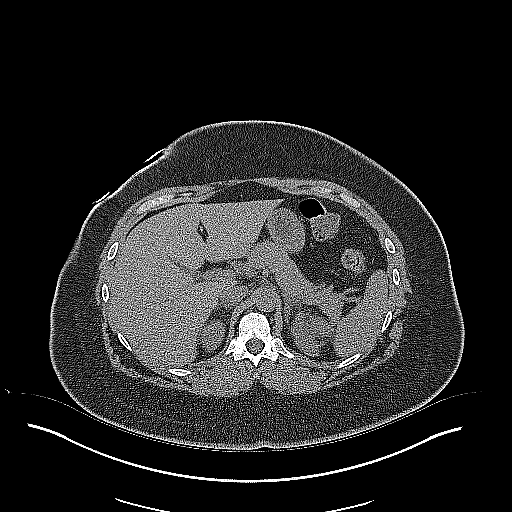
[im 4/61  bone]
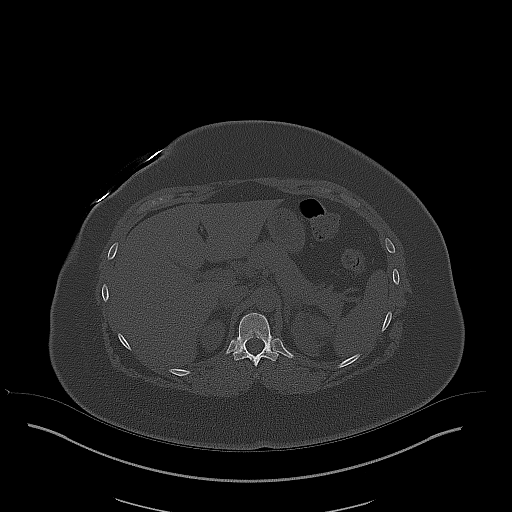
[im 8/61  soft-tissue]
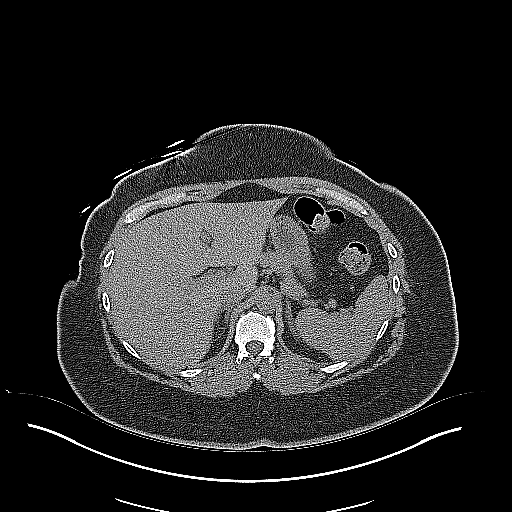
[im 12/61  soft-tissue]
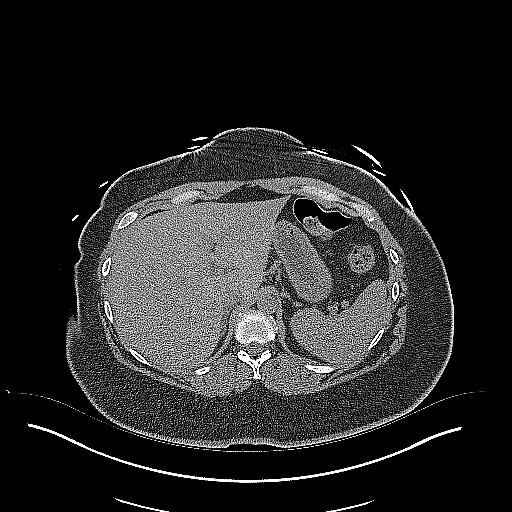
[im 18/61  soft-tissue]
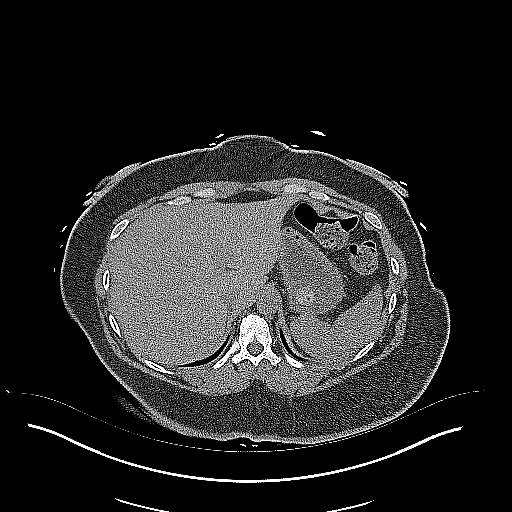
[im 22/61  soft-tissue]
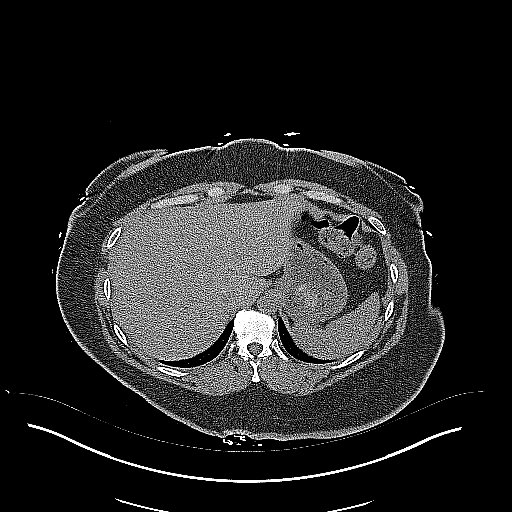
[im 26/61  soft-tissue]
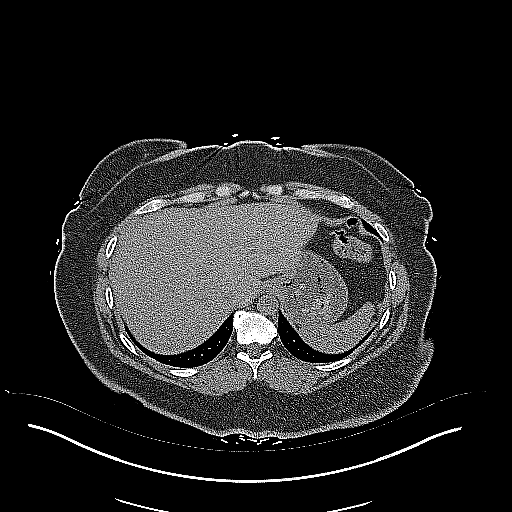
[im 31/61  soft-tissue]
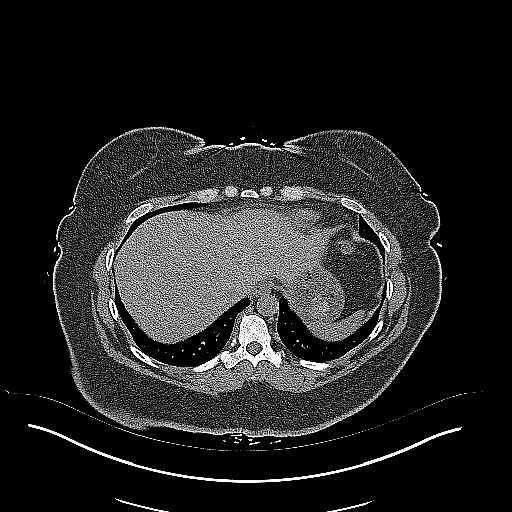
[im 35/61  soft-tissue]
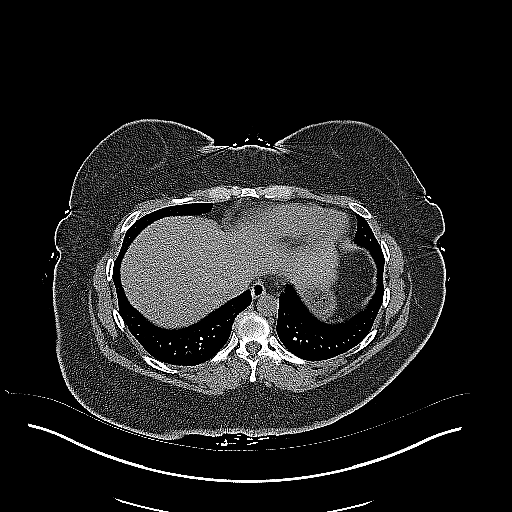
[im 39/61  soft-tissue]
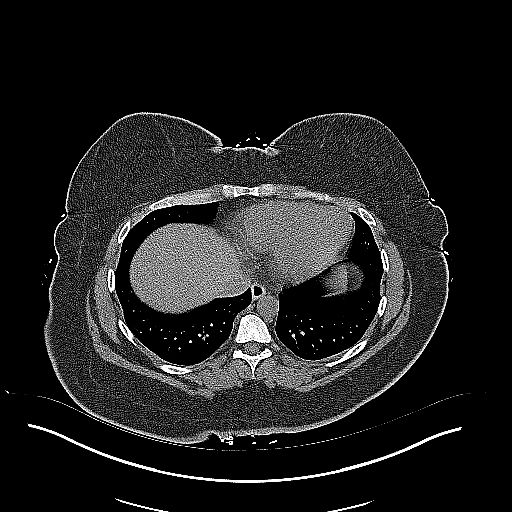
[im 39/61  bone]
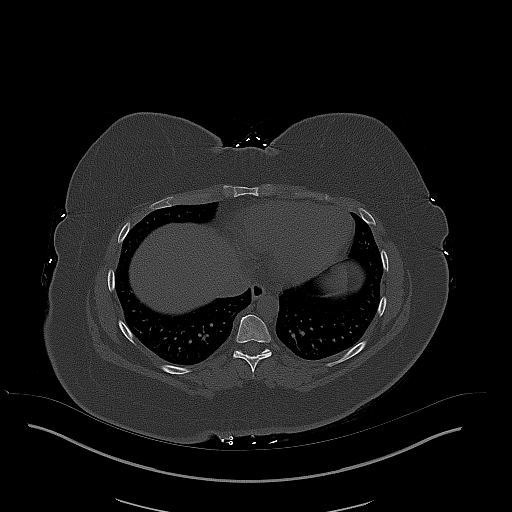
[im 43/61  soft-tissue]
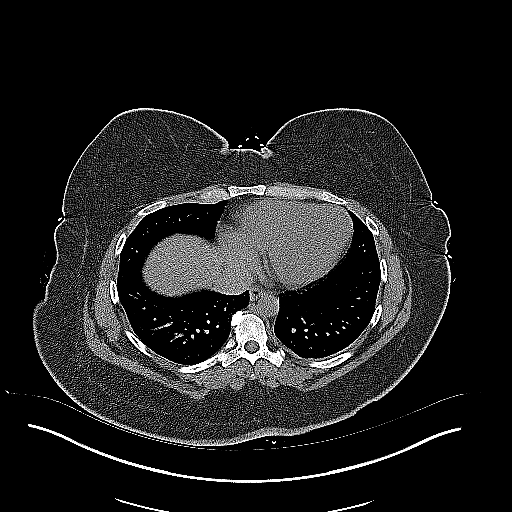
[im 49/61  soft-tissue]
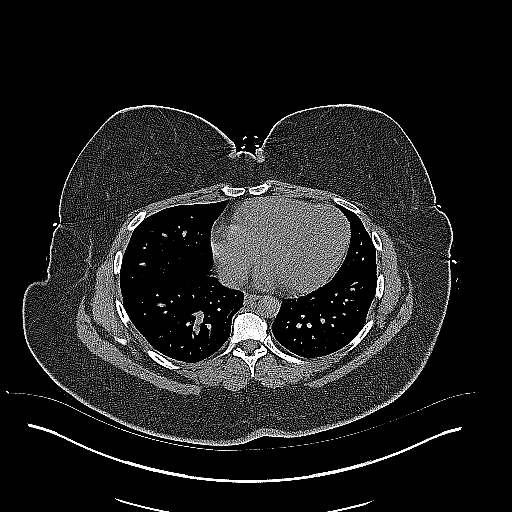
[im 53/61  soft-tissue]
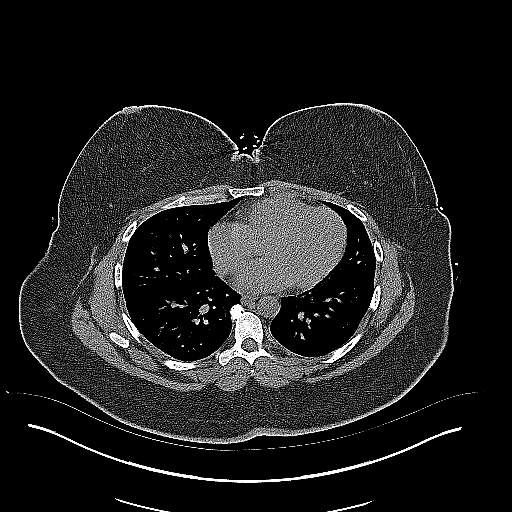
[im 57/61  soft-tissue]
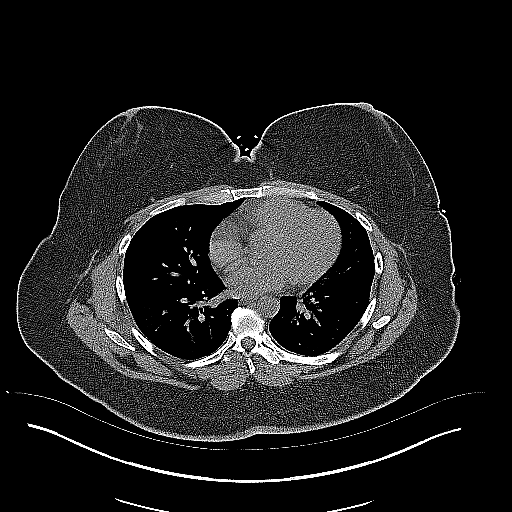

[16 of 46 positions shown; findings below may reference images not displayed]

FINDINGS: Lower chest: Lung bases clear

Hepatobiliary: Liver and gallbladder unremarkable

Pancreas: Normal appearance

Spleen: Normal appearance

Adrenals/Urinary Tract: Adrenal glands, kidneys, ureters, and
bladder normal appearance

Stomach/Bowel: Normal appendix. Stomach and bowel loops normal
appearance

Vascular/Lymphatic: Aorta normal caliber. Scattered pelvic
phleboliths. No adenopathy.

Reproductive: Uterus surgically absent. Minimal air in vagina.
Ovaries unremarkable.

Other: No free air or free fluid.  No inflammatory process.

Musculoskeletal: Bones unremarkable.
IMPRESSION: No acute intra-abdominal or intrapelvic abnormalities.

## 2019-05-09 ENCOUNTER — Other Ambulatory Visit: Payer: Self-pay | Admitting: Family Medicine

## 2019-05-09 DIAGNOSIS — G609 Hereditary and idiopathic neuropathy, unspecified: Secondary | ICD-10-CM

## 2019-05-12 ENCOUNTER — Encounter: Payer: Self-pay | Admitting: Family Medicine

## 2019-05-12 ENCOUNTER — Ambulatory Visit (INDEPENDENT_AMBULATORY_CARE_PROVIDER_SITE_OTHER): Payer: 59 | Admitting: Family Medicine

## 2019-05-12 ENCOUNTER — Other Ambulatory Visit: Payer: Self-pay

## 2019-05-12 VITALS — BP 124/78 | HR 95

## 2019-05-12 DIAGNOSIS — H6690 Otitis media, unspecified, unspecified ear: Secondary | ICD-10-CM | POA: Diagnosis not present

## 2019-05-12 DIAGNOSIS — R21 Rash and other nonspecific skin eruption: Secondary | ICD-10-CM

## 2019-05-12 DIAGNOSIS — G609 Hereditary and idiopathic neuropathy, unspecified: Secondary | ICD-10-CM

## 2019-05-12 MED ORDER — GABAPENTIN 300 MG PO CAPS: ORAL_CAPSULE | ORAL | 0 refills | Status: DC

## 2019-05-12 MED ORDER — AMOXICILLIN-POT CLAVULANATE 875-125 MG PO TABS: 1.0000 | ORAL_TABLET | Freq: Two times a day (BID) | ORAL | 0 refills | Status: DC

## 2019-05-12 MED ORDER — FAMOTIDINE 20 MG PO TABS: 20.0000 mg | ORAL_TABLET | Freq: Two times a day (BID) | ORAL | 0 refills | Status: DC

## 2019-05-12 NOTE — Patient Instructions (Signed)
It was a pleasure to see you today! Thank you for choosing [facility] for your primary care. Joanna [name] was seen for left ear pain. Come back to the clinic if anything gets worse.  Today with Dr. Your ear pain which appears to be a minor ear infection.  Also appears you have a coinciding rash along the left side of forehead.  We could prescribe some antibiotics that he can pick up if this gets worse although as we discussed we do not think that antibiotic should be required for something that appears this minor so far.  With the rash I think it is likely just a combination of the infection but in case there is an allergic component we are give you some Pepcid and you continue take Benadryl.  This should help calm down   Please bring all your medications to every doctors visit   Sign up for My Chart to have easy access to your labs results, and communication with your Primary care physician.     Please check-out at the front desk before leaving the clinic.     Best,  Dr. Sherene Sires FAMILY MEDICINE RESIDENT - PGY3 05/12/2019 11:07 AM

## 2019-05-13 DIAGNOSIS — H6690 Otitis media, unspecified, unspecified ear: Secondary | ICD-10-CM | POA: Insufficient documentation

## 2019-05-13 DIAGNOSIS — R21 Rash and other nonspecific skin eruption: Secondary | ICD-10-CM | POA: Insufficient documentation

## 2019-05-13 DIAGNOSIS — G609 Hereditary and idiopathic neuropathy, unspecified: Secondary | ICD-10-CM | POA: Insufficient documentation

## 2019-05-13 NOTE — Assessment & Plan Note (Signed)
Ear pain consistent with story of otitis media, on physical exam the ear canal is clear with no sign of irritation, the tympanic membrane appears to be intact with only mild fluid accumulation behind it.  Does not appear to be bulging.  We discussed antibiotic stewardship and will prescribe Augmentin which patient agrees to only take if things get significantly worse.

## 2019-05-13 NOTE — Progress Notes (Signed)
    Subjective:  Joanna [name] is a 43 y.o. female who presents to the [facility] today with a chief complaint of ear pain and face rash.   HPI: Rash Nonpustular, slightly raised, slightly tender, mildly reddened rash along left side of face particularly at forehead and into left scalp.  Does potentially have appearance of nonpustular acne but patient says this is new and given distribution is potentially related to her new left-sided ear pain if there was a allergic component to a potential otitis media.  Acute otitis media Ear pain consistent with story of otitis media, on physical exam the ear canal is clear with no sign of irritation, the tympanic membrane appears to be intact with only mild fluid accumulation behind it.  Does not appear to be bulging.  Objective:  Physical Exam: BP 124/78   Pulse 95   SpO2 98%   Gen: NAD, conversing comfortably CV: RRR with no murmurs appreciated Pulm: NWOB, CTAB with no crackles, wheezes, or rhonchi *Left ear exam, canal is without erythema there is no bulging of the left tympanic membrane which appears intact but there does to be appear to be some fluid accumulation.  Right ear exam is completely normal MSK: no edema, cyanosis, or clubbing noted Skin: Nonpustular, slightly raised, slightly tender, mildly reddened rash along left side of face particularly at forehead and into left scalp.  There is no eye involvement Neuro: grossly normal, moves all extremities Psych: Normal affect and thought content  No results found for this or any previous visit (from the past 72 hour(s)).   Assessment/Plan:  Rash Nonpustular, slightly raised, slightly tender, mildly reddened rash along left side of face particularly at forehead and into left scalp.  Does potentially have appearance of nonpustular acne but patient says this is new and given distribution is potentially related to her new left-sided ear pain if there was a allergic component to a potential otitis  media.  She is already taken Benadryl, can add some famotidine short course.  Were also going to give her an antibiotic Augmentin for the ear infection which she is supposed to take only if it gets significantly worse because this appears to be a self-limited minor case of otitis media.  Antibiotic stewardship was discussed  Acute otitis media Ear pain consistent with story of otitis media, on physical exam the ear canal is clear with no sign of irritation, the tympanic membrane appears to be intact with only mild fluid accumulation behind it.  Does not appear to be bulging.  We discussed antibiotic stewardship and will prescribe Augmentin which patient agrees to only take if things get significantly worse.   Sherene Sires, DO FAMILY MEDICINE RESIDENT - PGY3 05/13/2019 9:14 AM

## 2019-05-13 NOTE — Assessment & Plan Note (Signed)
Nonpustular, slightly raised, slightly tender, mildly reddened rash along left side of face particularly at forehead and into left scalp.  Does potentially have appearance of nonpustular acne but patient says this is new and given distribution is potentially related to her new left-sided ear pain if there was a allergic component to a potential otitis media.  She is already taken Benadryl, can add some famotidine short course.  Were also going to give her an antibiotic Augmentin for the ear infection which she is supposed to take only if it gets significantly worse because this appears to be a self-limited minor case of otitis media.  Antibiotic stewardship was discussed

## 2019-05-14 ENCOUNTER — Other Ambulatory Visit: Payer: Self-pay

## 2019-05-14 ENCOUNTER — Ambulatory Visit (INDEPENDENT_AMBULATORY_CARE_PROVIDER_SITE_OTHER): Payer: 59 | Admitting: Family Medicine

## 2019-05-14 ENCOUNTER — Encounter: Payer: Self-pay | Admitting: Family Medicine

## 2019-05-14 VITALS — BP 118/68 | HR 96 | Wt 194.8 lb

## 2019-05-14 DIAGNOSIS — B029 Zoster without complications: Secondary | ICD-10-CM

## 2019-05-14 MED ORDER — TRAMADOL HCL 50 MG PO TABS: 50.0000 mg | ORAL_TABLET | Freq: Three times a day (TID) | ORAL | 0 refills | Status: AC | PRN

## 2019-05-14 MED ORDER — VALACYCLOVIR HCL 1 G PO TABS: 1000.0000 mg | ORAL_TABLET | Freq: Three times a day (TID) | ORAL | 0 refills | Status: AC

## 2019-05-14 NOTE — Progress Notes (Signed)
Subjective:    Joanna [name] - 43 y.o. female MRN [id number]  Date of birth: 08-17-1975  CC:  Joanna [name] is here for continued ear pain and rash.  HPI: Symptoms started with a migraine on Thanksgiving day.  This was followed by a painful rash that covered the left side of her forehead starting Sunday.  On Monday, her left ear started hurting.  She was seen in our office on Monday, 11/30, and was given Benadryl for her rash and Augmentin for possible acute otitis media.  She has been using both of these medications as prescribed.  Since her appointment on Monday, her left ear has hurt worse and the area anterior to it seems to be swollen.  She also has swollen areas on the left side of her neck.  She has pain in the area at night that has kept her from sleeping.  She also says her rash has not spread but also not improved.  It hurts rather than itches and feels like "pouring rubbing alcohol on an open sore."  She denies fever or other systemic symptoms.  She denies having a rash anywhere else.   Health Maintenance:  Health Maintenance Due  Topic Date Due  . DTaP/Tdap/Td (1 - Tdap) 04/04/1993    -  reports that she has quit smoking. She has never used smokeless tobacco. - Review of Systems: Per HPI. - Past Medical History: Patient Active Problem List   Diagnosis Date Noted  . Herpes zoster without complication 30/86/5784  . Rash 05/13/2019  . Acute otitis media 05/13/2019  . Hereditary and idiopathic peripheral neuropathy 05/13/2019  . 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 Fracture of base of fifth metatarsal bone of left foot at metaphyseal-diaphyseal junction, closed, initial encounter 01/22/2018  . Peroneal tendinitis of left lower extremity 12/21/2017  . Fibromyalgia 08/23/2016  . Mood changes 05/11/2010  . MENOPAUSE, SURGICAL 05/11/2010  . SICKLE CELL TRAIT 08/09/2006  . MIGRAINE, UNSPEC., W/O INTRACTABLE MIGRAINE 08/09/2006     -  Medications: reviewed and updated   Objective:   Physical Exam BP 118/68   Pulse 96   Wt 194 lb 12.8 oz (88.4 kg)   SpO2 99%   BMI 36.81 kg/m  Gen: NAD, alert, cooperative with exam, appears mildly uncomfortable HEENT: NCAT, clear conjunctiva, TMs clear bilaterally, no tenderness on retraction of left auricle, no vesicles in the ear canal.  Swollen area anterior to left ear that is about 1.5 cm in diameter, mobile, and soft.  Palpable left cervical lymph nodes. CV: RRR, good S1/S2, no murmur, no edema Resp: CTABL, no wheezes, non-labored Skin: Papular rash as pictured below, does not cross the midline, no vesicles or crusting. Neuro: no gross deficits.  Psych: good insight, alert and oriented          Assessment & Plan:   Herpes zoster without complication Patient's painful, dermatomal rash that does not cross the midline is likely the early appearance of herpes zoster.  Although it does not have the vesicular, crusting appearance of a normal shingles rash, this rash can appear maculopapular at the outset.  Patient's ear pain is likely due to preauricular lymphadenopathy in response to her infection, especially since it is on the same side and downstream of her rash and she has associated cervical lymphadenopathy.  She was told to stop the Benadryl and Augmentin and was prescribed valacyclovir 1000 mg 3 times daily for 7 days.  This treatment may be less effective  because she is around 72 hours from the first appearance of her rash.  She was also given tramadol 50 mg every 8 hours as needed for 7-day supply for her acute pain.  She was advised to be seen in our clinic early next week or earlier if needed and to call if her symptoms worsen.    Lezlie Octave, M.D. 05/16/2019, 10:04 AM PGY-3, [facility]

## 2019-05-14 NOTE — Patient Instructions (Addendum)
It was nice meeting you today Ms. Owens Shark!  I am concerned that your rash and ear pain are due to Shingles.  I am sending valacyclovir, which should treat this infection, although you may still have symptoms.  Please let us know if you do not experience any improvement.  I would like for you to be seen again in about one week to monitor for improvement, although please come in earlier if you have any concerns.  If you have any questions or concerns, please feel free to call the clinic.   Be well,  Dr. Shan Levans

## 2019-05-16 ENCOUNTER — Telehealth: Payer: Self-pay | Admitting: *Deleted

## 2019-05-16 DIAGNOSIS — B029 Zoster without complications: Secondary | ICD-10-CM | POA: Insufficient documentation

## 2019-05-16 NOTE — Telephone Encounter (Signed)
Pt was dx with Shingles on Wednesday, she was given valcyclovir and tramadol.  She woke up this AM with eye swelling and redness. Spoke with Dr. Owens Shark, pt will need urgent referral to optho.  Pt informed and agreeable.  To Dr. Sherrine Maples to place referral.  Christen Bame, CMA

## 2019-05-16 NOTE — Telephone Encounter (Signed)
Pt called her eye doctor Dr. Truman Hayward at [facility] on [address]. Dr. Truman Hayward told the patient she can treat her for this condition. She has an appointment within the hour. jw

## 2019-05-16 NOTE — Telephone Encounter (Signed)
Agree with below, patient to be seen today by Ophthalmology today.  Dorris Singh, MD  Family Medicine Teaching Service

## 2019-05-16 NOTE — Assessment & Plan Note (Signed)
Patient's painful, dermatomal rash that does not cross the midline is likely the early appearance of herpes zoster.  Although it does not have the vesicular, crusting appearance of a normal shingles rash, this rash can appear maculopapular at the outset.  Patient's ear pain is likely due to preauricular lymphadenopathy in response to her infection, especially since it is on the same side and downstream of her rash and she has associated cervical lymphadenopathy.  She was told to stop the Benadryl and Augmentin and was prescribed valacyclovir 1000 mg 3 times daily for 7 days.  This treatment may be less effective because she is around 72 hours from the first appearance of her rash.  She was also given tramadol 50 mg every 8 hours as needed for 7-day supply for her acute pain.  She was advised to be seen in our clinic early next week or earlier if needed and to call if her symptoms worsen.

## 2019-07-03 ENCOUNTER — Other Ambulatory Visit: Payer: Self-pay

## 2019-07-03 ENCOUNTER — Encounter: Payer: Self-pay | Admitting: Family Medicine

## 2019-07-03 ENCOUNTER — Ambulatory Visit (INDEPENDENT_AMBULATORY_CARE_PROVIDER_SITE_OTHER): Payer: Managed Care, Other (non HMO) | Admitting: Family Medicine

## 2019-07-03 DIAGNOSIS — M5432 Sciatica, left side: Secondary | ICD-10-CM | POA: Diagnosis not present

## 2019-07-03 MED ORDER — MELOXICAM 15 MG PO TABS: 15.0000 mg | ORAL_TABLET | Freq: Every day | ORAL | 0 refills | Status: DC

## 2019-07-03 MED ORDER — CYCLOBENZAPRINE HCL 10 MG PO TABS: 10.0000 mg | ORAL_TABLET | Freq: Three times a day (TID) | ORAL | 0 refills | Status: DC | PRN

## 2019-07-03 NOTE — Patient Instructions (Signed)
It was great meeting you today!  Think your sciatica is likely being triggered by piriformis muscle inflammation.  We will see this commonly in people that have weak hip abductor muscles.  I gave you some rehab stretches to do for your piriformis muscles, as well as strengthening exercises for her hip abductor muscles.  He can continue taking the gabapentin as you have been.  I sent in a prescription for Flexeril and meloxicam.  The Flexeril is a muscle relaxer, you can take this up to 3 times per day.  Take the meloxicam 15 mg only 1 times daily.  Do not take any other nonsteroidal anti-inflammatory medication such as ibuprofen or naproxen with this.  Please follow-up as needed for this issue.  I believe the stretches should help get this problem in the future.  You can also take the Voltaren gel as needed for a topical relief.  Also recommend icing and heating the area as tolerated.

## 2019-07-05 ENCOUNTER — Encounter: Payer: Self-pay | Admitting: Family Medicine

## 2019-07-05 NOTE — Assessment & Plan Note (Signed)
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  She is likely having to use her lateral hip rotators to compensate for weakness with her abductors causing hypertrophy and then compression of her sciatic nerve.  To decrease inflammation can take meloxicam 15 mg daily for 2 weeks, and then as needed afterwards.  Gave Flexeril 10 mg 3 times daily as needed.  Gave hip abductor strengthening exercises, as well as piriformis rehab stretches.  Can continue taking gabapentin, discussed topical liniments and icing the area down.  Can follow-up in 2 or 3 weeks if the above does not improve. -Mobic 15 mg daily for 2 weeks, then as needed -Continue gabapentin 600 mg 3 times daily -Cyclobenzaprine 10 mg 3 times daily as needed -Piriformis rehab stretches given -Hip abductor strength exercises given -Discussed topical liniments as well as ice and heat -follow-up in 2 to 3 weeks as needed

## 2019-07-05 NOTE — Progress Notes (Signed)
   HPI 44 year old female who presents for left lower back pain and sciatica-like symptoms.  She states that her pain for started several months ago.  It was a acute onset after dropping a long duration to the beach.  Pain was severe in her lower back and is described as a burning shooting sensation down her left buttock and left posterior leg.  She states this pain has been intermittent since then.  It has been progressively worsening over the last 2 weeks prompting her to come get it checked out.  She states that a muscle relaxer helped her significantly in the past for the same issue.  She has been doing some stretches which have been helping some.  She has been taking her gabapentin up to 600 mg 3 times daily.  CC: Left-sided low back pain, sciatica-like symptoms   ROS:   Review of Systems See HPI for ROS.   CC, SH/smoking status, and VS noted  Objective: BP 105/75   Pulse 81   Temp 98.4 F (36.9 C) (Oral)   Wt 200 lb (90.7 kg)   SpO2 98%   BMI 37.79 kg/m  Gen: Very pleasant 44 year old African-American female, no acute distress CV: Skin warm and dry Resp: No accessory muscle use Neuro: Alert and oriented, Speech clear, No gross deficits Left leg: Tenderness to palpation just inferior to left greater trochanter.  Pain noted on straight leg test.  Pain elicited with external and internal rotation of foot with leg in full extension.  Patient with 5 out of 5 strength to hip abductors, but quite weak against applied force.   Assessment and plan:  Sciatica Given patient's weakness of her hip abductors most likely etiology of her sciatica is piriformis syndrome.  She is likely having to use her lateral hip rotators to compensate for weakness with her abductors causing hypertrophy and then compression of her sciatic nerve.  To decrease inflammation can take meloxicam 15 mg daily for 2 weeks, and then as needed afterwards.  Gave Flexeril 10 mg 3 times daily as needed.  Gave hip abductor  strengthening exercises, as well as piriformis rehab stretches.  Can continue taking gabapentin, discussed topical liniments and icing the area down.  Can follow-up in 2 or 3 weeks if the above does not improve. -Mobic 15 mg daily for 2 weeks, then as needed -Continue gabapentin 600 mg 3 times daily -Cyclobenzaprine 10 mg 3 times daily as needed -Piriformis rehab stretches given -Hip abductor strength exercises given -Discussed topical liniments as well as ice and heat -follow-up in 2 to 3 weeks as needed   No orders of the defined types were placed in this encounter.   Meds ordered this encounter  Medications  . cyclobenzaprine (FLEXERIL) 10 MG tablet    Sig: Take 1 tablet (10 mg total) by mouth 3 (three) times daily as needed for muscle spasms.    Dispense:  30 tablet    Refill:  0  . meloxicam (MOBIC) 15 MG tablet    Sig: Take 1 tablet (15 mg total) by mouth daily.    Dispense:  30 tablet    Refill:  0    Myrene Buddy MD PGY-3 Family Medicine Resident  07/05/2019 11:46 PM

## 2019-07-21 ENCOUNTER — Encounter: Payer: Self-pay | Admitting: Family Medicine

## 2019-07-21 ENCOUNTER — Other Ambulatory Visit: Payer: Self-pay

## 2019-07-21 ENCOUNTER — Ambulatory Visit (INDEPENDENT_AMBULATORY_CARE_PROVIDER_SITE_OTHER): Payer: Managed Care, Other (non HMO) | Admitting: Family Medicine

## 2019-07-21 ENCOUNTER — Ambulatory Visit (INDEPENDENT_AMBULATORY_CARE_PROVIDER_SITE_OTHER): Payer: Managed Care, Other (non HMO)

## 2019-07-21 VITALS — BP 120/88 | HR 97 | Ht 61.0 in | Wt 203.0 lb

## 2019-07-21 DIAGNOSIS — M545 Low back pain, unspecified: Secondary | ICD-10-CM

## 2019-07-21 DIAGNOSIS — M5416 Radiculopathy, lumbar region: Secondary | ICD-10-CM | POA: Insufficient documentation

## 2019-07-21 MED ORDER — KETOROLAC TROMETHAMINE 60 MG/2ML IM SOLN
60.0000 mg | Freq: Once | INTRAMUSCULAR | Status: AC
  Administered 2019-07-21: 60 mg via INTRAMUSCULAR

## 2019-07-21 MED ORDER — METHYLPREDNISOLONE ACETATE 80 MG/ML IJ SUSP
80.0000 mg | Freq: Once | INTRAMUSCULAR | Status: AC
  Administered 2019-07-21: 16:00:00 80 mg via INTRAMUSCULAR

## 2019-07-21 MED ORDER — PREDNISONE 50 MG PO TABS: ORAL_TABLET | ORAL | 0 refills | Status: DC

## 2019-07-21 NOTE — Assessment & Plan Note (Signed)
Patient is having more of a lumbar radiculopathy.  Continues to increase her gabapentin fairly significantly.  I want patient to try a Horizant sample and see how patient responds.  This will be a longer acting related but may have less side effects.  Discussed core stability, x-rays ordered today to further evaluate from patient's x-rays seen 5 years ago.  Has had an MRI showing a nerve impingement as well that did get better with conservative therapy.  We discussed avoiding heavy lifting for the next 2 weeks and given a note for work.  Patient will follow up with me again in 2 to 3 weeks.

## 2019-07-21 NOTE — Patient Instructions (Addendum)
Back ex Prednisone for next 5 days Write [place] by end of week if you like gabapentin Xray back See me in 2-3 weeks

## 2019-07-21 NOTE — Progress Notes (Signed)
[place] Phone: [phone] Subjective:   Joanna [name], am serving as a scribe for Dr. Hulan Saas. This visit occurred during the SARS-CoV-2 public health emergency.  Safety protocols were in place, including screening questions prior to the visit, additional usage of staff PPE, and extensive cleaning of exam room while observing appropriate contact time as indicated for disinfecting solutions.   I'm seeing this patient by the request  of:  Lind Covert, MD  CC: Left low back pain and leg pain  [id number]   7/17/20230 Patient is doing much better at this time.  Discussed icing regimen and home exercises.  Patient will continue to watch the range of motion exercises.  We discussed meloxicam bursts of 3 days at a time.  We will continue vitamin D.  Follow-up with me again as needed.  Update 07/21/2019 Joanna [name] is a 44 y.o. female coming in with complaint of leg pain. Pain moves from the glute to the hamstring. Tried muscle relaxer and this did not alleviate pain. Pain increasing recently. Does have numbness in lateral aspect of left foot.  Reviewing patient's chart in great entirety show that patient did have x-rays taken 5 years ago for the lower spine.  Patient's did have mild degenerative disc disease at L5-S1.  MRI also ordered at that time that was further evaluated by me found to have a nerve impingement on the left side at the L5-S1.  Patient states that the pain can be significant at night as well.  Rates the severity of pain is 7 out of 10.  Not responding to the anti-inflammatories or the gabapentin she has been taking.    Past Medical History:  Diagnosis Date  . Arthritis   . Migraine    Past Surgical History:  Procedure Laterality Date  . ABDOMINAL HYSTERECTOMY    . BREAST SURGERY    . REDUCTION MAMMAPLASTY Bilateral 2001   Social History   Socioeconomic History  . Marital  status: Legally Separated    Spouse name: Not on file  . Number of children: Not on file  . Years of education: Not on file  . Highest education level: Not on file  Occupational History  . Not on file  Tobacco Use  . Smoking status: Former Research scientist (life sciences)  . Smokeless tobacco: Never Used  Substance and Sexual Activity  . Alcohol use: [name]  . Drug use: [name]  . Sexual activity: Yes    Birth control/protection: Surgical  Other Topics Concern  . Not on file  Social History Narrative   Works at [employer]    Daughter - 2008.   Goes to [employer] school   Social Determinants of Health   Financial Resource Strain:   . Difficulty of Paying Living Expenses: Not on file  Food Insecurity:   . Worried About Charity fundraiser in the Last Year: Not on file  . Ran Out of Food in the Last Year: Not on file  Transportation Needs:   . Lack of Transportation (Medical): Not on file  . Lack of Transportation (Non-Medical): Not on file  Physical Activity:   . Days of Exercise per Week: Not on file  . Minutes of Exercise per Session: Not on file  Stress:   . Feeling of Stress : Not on file  Social Connections:   . Frequency of Communication with Friends and Family: Not on file  .  Frequency of Social Gatherings with Friends and Family: Not on file  . Attends Religious Services: Not on file  . Active Member of Clubs or Organizations: Not on file  . Attends Banker Meetings: Not on file  . Marital Status: Not on file   [name] Known Allergies [name] family history on file.  Current Outpatient Medications (Endocrine & Metabolic):  .  predniSONE (DELTASONE) 50 MG tablet, Take one tablet daily for the next 5 days.    Current Outpatient Medications (Analgesics):  .  aspirin-acetaminophen-caffeine (EXCEDRIN MIGRAINE) 250-250-65 MG tablet, Take 1 tablet by mouth every 6 (six) hours as needed for headache. .  meloxicam (MOBIC) 15 MG tablet, Take 1 tablet (15 mg total)  by mouth daily.   Current Outpatient Medications (Other):  .  cyclobenzaprine (FLEXERIL) 10 MG tablet, Take 1 tablet (10 mg total) by mouth 3 (three) times daily as needed for muscle spasms. .  famotidine (PEPCID) 20 MG tablet, Take 1 tablet (20 mg total) by mouth 2 (two) times daily for 7 days.   Reviewed prior external information including notes and imaging from  primary care provider As well as notes that were available from care everywhere and other healthcare systems.  Past medical history, social, surgical and family history all reviewed in electronic medical record.  [name] pertanent information unless stated regarding to the chief complaint.   Review of Systems:  [name] headache, visual changes, nausea, vomiting, diarrhea, constipation, dizziness, abdominal pain, skin rash, fevers, chills, night sweats, weight loss, swollen lymph nodes, body aches, joint swelling, chest pain, shortness of breath, mood changes. POSITIVE muscle aches  Objective  Blood pressure 120/88, pulse 97, height 5\' 1"  (1.549 m), weight 203 lb (92.1 kg), SpO2 99 %.   General: [name] apparent distress alert and oriented x3 mood and affect normal, dressed appropriately.  HEENT: Pupils equal, extraocular movements intact  Respiratory: Patient's speak in full sentences and does not appear short of breath  Cardiovascular: [name] lower extremity edema, non tender, [name] erythema  Skin: Warm dry intact with [name] signs of infection or rash on extremities or on axial skeleton.  Abdomen: Soft nontender  Neuro: Cranial nerves II through XII are intact, neurovascularly intact in all extremities with 2+ DTRs and 2+ pulses.  Lymph: [name] lymphadenopathy of posterior or anterior cervical chain or axillae bilaterally.  Gait normal with good balance and coordination.  MSK:  Non tender with full range of motion and good stability and symmetric strength and tone of shoulders, elbows, wrist, hip, knee and ankles bilaterally.  Low back exam shows the  patient does have some loss of lordosis.  Tender to palpation in the paraspinal musculature lumbar spine left greater than right.  Positive straight leg test on the left side.  4-5 strength in dorsi flexion compared to the contralateral side but deep tendon reflexes are intact.  Mild increase in pain with resisted hip flexion as well.   Impression and Recommendations:     This case required medical decision making of moderate complexity. The above documentation has been reviewed and is accurate and complete , DO       Note: This dictation was prepared with Dragon dictation along with smaller phrase technology. Any transcriptional errors that result from this process are unintentional.

## 2019-08-01 ENCOUNTER — Other Ambulatory Visit: Payer: Self-pay | Admitting: Family Medicine

## 2019-08-01 ENCOUNTER — Telehealth: Payer: Self-pay

## 2019-08-01 DIAGNOSIS — G609 Hereditary and idiopathic neuropathy, unspecified: Secondary | ICD-10-CM

## 2019-08-01 MED ORDER — GABAPENTIN 300 MG PO CAPS: ORAL_CAPSULE | ORAL | 0 refills | Status: DC

## 2019-08-01 NOTE — Telephone Encounter (Signed)
Patient calls nurse line regarding refill request for gabapentin. Patient states that she is taking 300 mg gabapentin (1-2 capsules) three times daily for fibromyalgia. Patient states that she is to run out of medication tomorrow.    Patient states that sports medicine doctor had discussed potentially doing ER, however, she would like to continue current regimen.    To PCP  Veronda Prude, RN

## 2019-08-15 ENCOUNTER — Encounter: Payer: Self-pay | Admitting: Family Medicine

## 2019-08-15 ENCOUNTER — Ambulatory Visit (INDEPENDENT_AMBULATORY_CARE_PROVIDER_SITE_OTHER): Payer: 59 | Admitting: Family Medicine

## 2019-08-15 ENCOUNTER — Other Ambulatory Visit: Payer: Self-pay

## 2019-08-15 DIAGNOSIS — M999 Biomechanical lesion, unspecified: Secondary | ICD-10-CM | POA: Insufficient documentation

## 2019-08-15 DIAGNOSIS — M5416 Radiculopathy, lumbar region: Secondary | ICD-10-CM | POA: Diagnosis not present

## 2019-08-15 MED ORDER — GABAPENTIN 300 MG PO CAPS: 300.0000 mg | ORAL_CAPSULE | Freq: Every day | ORAL | 0 refills | Status: DC

## 2019-08-15 MED ORDER — DUEXIS 800-26.6 MG PO TABS: 1.0000 | ORAL_TABLET | Freq: Three times a day (TID) | ORAL | 3 refills | Status: DC | PRN

## 2019-08-15 NOTE — Patient Instructions (Signed)
Gabapentin 900am, 300afternoon, 900 at night Duexis initially 3x a day for 3 days, then after that take for 3 days Tried manipulation  See me again in 4 weeks

## 2019-08-15 NOTE — Assessment & Plan Note (Signed)
Continues to have some radicular symptoms. He was 100% better though with the epidural for days but did not last longer than not. He does not want repetitively during these injections and wants to avoid surgical intervention. Attempted osteopathic manipulation and is hopeful that this will be helpful. Increase gabapentin somewhat to 300 mg also in the afternoon compared to patient's 900 and the a.m. and 900 in the evening. Given anti-inflammatories to see how patient would respond. Follow-up with me again in 4 to 6 weeks

## 2019-08-15 NOTE — Assessment & Plan Note (Signed)
Decision today to treat with OMT was based on Physical Exam  After verbal consent patient was treated with HVLA, ME, FPR techniques in  thoracic, lumbar and sacral areas  Patient tolerated the procedure well with improvement in symptoms  Patient given exercises, stretches and lifestyle modifications  See medications in patient instructions if given  Patient will follow up in 4-8 weeks 

## 2019-08-15 NOTE — Progress Notes (Signed)
[place] Phone: [phone] Subjective:   [name], am serving as a scribe for Dr. Hulan Saas. This visit occurred during the SARS-CoV-2 public health emergency.  Safety protocols were in place, including screening questions prior to the visit, additional usage of staff PPE, and extensive cleaning of exam room while observing appropriate contact time as indicated for disinfecting solutions.    I'm seeing this patient by the request  of:  Lind Covert, MD  CC: Low back pain follow-up with leg pain  [id number] \ 07/21/2019 Patient is having more of a lumbar radiculopathy.  Continues to increase her gabapentin fairly significantly.  I want patient to try a Horizant sample and see how patient responds.  This will be a longer acting related but may have less side effects.  Discussed core stability, x-rays ordered today to further evaluate from patient's x-rays seen 5 years ago.  Has had an MRI showing a nerve impingement as well that did get better with conservative therapy.  We discussed avoiding heavy lifting for the next 2 weeks and given a note for work.  Patient will follow up with me again in 2 to 3 weeks.  Update 08/15/2019 Joanna [name] is a 44 y.o. female coming in with complaint of back pain. States that she continues to have radiating pain down the left leg. Did not notice a difference with Horizant. Feels that it worked until noon. Had epidural on 07/21/2019 that gave her 2 days of relief. Has been stretching to help reduce the pain she has been in.      Past Medical History:  Diagnosis Date  . Arthritis   . Migraine    Past Surgical History:  Procedure Laterality Date  . ABDOMINAL HYSTERECTOMY    . BREAST SURGERY    . REDUCTION MAMMAPLASTY Bilateral 2001   Social History   Socioeconomic History  . Marital status: Legally Separated    Spouse name: Not on file  . Number of  children: Not on file  . Years of education: Not on file  . Highest education level: Not on file  Occupational History  . Not on file  Tobacco Use  . Smoking status: Former Research scientist (life sciences)  . Smokeless tobacco: Never Used  Substance and Sexual Activity  . Alcohol use: No  . Drug use: No  . Sexual activity: Yes    Birth control/protection: Surgical  Other Topics Concern  . Not on file  Social History Narrative   Works at [employer]    Daughter - 2008.   Goes to [employer] school   Social Determinants of Health   Financial Resource Strain:   . Difficulty of Paying Living Expenses: Not on file  Food Insecurity:   . Worried About Charity fundraiser in the Last Year: Not on file  . Ran Out of Food in the Last Year: Not on file  Transportation Needs:   . Lack of Transportation (Medical): Not on file  . Lack of Transportation (Non-Medical): Not on file  Physical Activity:   . Days of Exercise per Week: Not on file  . Minutes of Exercise per Session: Not on file  Stress:   . Feeling of Stress : Not on file  Social Connections:   . Frequency of Communication with Friends and Family: Not on file  . Frequency of Social Gatherings with Friends and Family: Not on file  . Attends  Religious Services: Not on file  . Active Member of Clubs or Organizations: Not on file  . Attends Banker Meetings: Not on file  . Marital Status: Not on file   No Known Allergies No family history on file.  Current Outpatient Medications (Endocrine & Metabolic):  .  predniSONE (DELTASONE) 50 MG tablet, Take one tablet daily for the next 5 days.    Current Outpatient Medications (Analgesics):  .  aspirin-acetaminophen-caffeine (EXCEDRIN MIGRAINE) 250-250-65 MG tablet, Take 1 tablet by mouth every 6 (six) hours as needed for headache. .  meloxicam (MOBIC) 15 MG tablet, Take 1 tablet (15 mg total) by mouth daily. .  Ibuprofen-Famotidine (DUEXIS) 800-26.6 MG TABS,  Take 1 tablet by mouth 3 (three) times daily as needed.   Current Outpatient Medications (Other):  .  cyclobenzaprine (FLEXERIL) 10 MG tablet, Take 1 tablet (10 mg total) by mouth 3 (three) times daily as needed for muscle spasms. Marland Kitchen  gabapentin (NEURONTIN) 300 MG capsule, TAKE 1-2 CAPSULES 3 TIMES A DAY .  famotidine (PEPCID) 20 MG tablet, Take 1 tablet (20 mg total) by mouth 2 (two) times daily for 7 days. Marland Kitchen  gabapentin (NEURONTIN) 300 MG capsule, Take 1 capsule (300 mg total) by mouth at bedtime.   Reviewed prior external information including notes and imaging from  primary care provider As well as notes that were available from care everywhere and other healthcare systems.  Past medical history, social, surgical and family history all reviewed in electronic medical record.  No pertanent information unless stated regarding to the chief complaint.   Review of Systems:  No headache, visual changes, nausea, vomiting, diarrhea, constipation, dizziness, abdominal pain, skin rash, fevers, chills, night sweats, weight loss, swollen lymph nodes, body aches, joint swelling, chest pain, shortness of breath, mood changes. POSITIVE muscle aches  Objective  Blood pressure 122/82, pulse (!) 102, height 5\' 1"  (1.549 m), weight 202 lb (91.6 kg), SpO2 97 %.   General: No apparent distress alert and oriented x3 mood and affect normal, dressed appropriately.  HEENT: Pupils equal, extraocular movements intact  Respiratory: Patient's speak in full sentences and does not appear short of breath  Cardiovascular: No lower extremity edema, non tender, no erythema  Skin: Warm dry intact with no signs of infection or rash on extremities or on axial skeleton.  Abdomen: Soft nontender  Neuro: Cranial nerves II through XII are intact, neurovascularly intact in all extremities with 2+ DTRs and 2+ pulses.  Lymph: No lymphadenopathy of posterior or anterior cervical chain or axillae bilaterally.  Gait normal with  good balance and coordination.  MSK: Low back exam shows the patient is some tenderness to palpation paraspinal musculature lumbar spine left greater than right. Mild positive radicular symptoms at 30 degrees of forward flexion. Mild pain with test as well though. Patient does have near full range of motion but does have pain. Neurovascularly intact distally.  Osteopathic findings  T9 extended rotated and side bent left L2 flexed rotated and side bent right Sacrum left on left     Impression and Recommendations:     This case required medical decision making of moderate complexity. The above documentation has been reviewed and is accurate and complete Pearlean Brownie, DO       Note: This dictation was prepared with Dragon dictation along with smaller phrase technology. Any transcriptional errors that result from this process are unintentional.

## 2019-09-11 ENCOUNTER — Encounter: Payer: Self-pay | Admitting: Family Medicine

## 2019-09-11 ENCOUNTER — Other Ambulatory Visit: Payer: Self-pay

## 2019-09-11 ENCOUNTER — Ambulatory Visit (INDEPENDENT_AMBULATORY_CARE_PROVIDER_SITE_OTHER): Payer: 59 | Admitting: Family Medicine

## 2019-09-11 VITALS — BP 120/70 | HR 81 | Ht 61.0 in | Wt 204.0 lb

## 2019-09-11 DIAGNOSIS — M999 Biomechanical lesion, unspecified: Secondary | ICD-10-CM

## 2019-09-11 DIAGNOSIS — M5416 Radiculopathy, lumbar region: Secondary | ICD-10-CM

## 2019-09-11 MED ORDER — METHYLPREDNISOLONE ACETATE 80 MG/ML IJ SUSP
80.0000 mg | Freq: Once | INTRAMUSCULAR | Status: AC
  Administered 2019-09-11: 80 mg via INTRAMUSCULAR

## 2019-09-11 MED ORDER — KETOROLAC TROMETHAMINE 60 MG/2ML IM SOLN
60.0000 mg | Freq: Once | INTRAMUSCULAR | Status: AC
  Administered 2019-09-11: 60 mg via INTRAMUSCULAR

## 2019-09-11 NOTE — Progress Notes (Signed)
[name] [address] [place] [address] Phone: [phone] Subjective:   This visit occurred during the SARS-CoV-2 public health emergency.  Safety protocols were in place, including screening questions prior to the visit, additional usage of staff PPE, and extensive cleaning of exam room while observing appropriate contact time as indicated for disinfecting solutions.   I'm seeing this patient by the request  of:  Carney Living, MD  CC: Left-sided low back pain  I, Christoper Fabian, LAT, ATC, am serving as scribe for Dr. Antoine Primas.  [id number]  JUNKO OHAGAN is a 44 y.o. female coming in with complaint of low back pain and L LE pain.  She was last seen by Dr. Katrinka Blazing on 08/15/19 and her Gabapentin dosage was increased to 900 in the morning and at night and 300mg  in the afternoon.  She was also provided w/ Duexis.  She had a lumbar epidural on 07/21/19.  Since her last visit, she reports that she was feeling better until last week when her pain worsened w/ no known cause.  She con't to take the increased dosage of gabapentin.  Her L leg pain runs to her L calf but her worst pain currently is in her L hamstring.     Past Medical History:  Diagnosis Date  . Arthritis   . Migraine    Past Surgical History:  Procedure Laterality Date  . ABDOMINAL HYSTERECTOMY    . BREAST SURGERY    . REDUCTION MAMMAPLASTY Bilateral 2001   Social History   Socioeconomic History  . Marital status: Legally Separated    Spouse name: Not on file  . Number of children: Not on file  . Years of education: Not on file  . Highest education level: Not on file  Occupational History  . Not on file  Tobacco Use  . Smoking status: Former 2002  . Smokeless tobacco: Never Used  Substance and Sexual Activity  . Alcohol use: No  . Drug use: No  . Sexual activity: Yes    Birth control/protection: Surgical  Other Topics Concern  . Not on file  Social History  Narrative   Works at Games developer - [employer]    Daughter - 2008.   Goes to 2009 school   Social Determinants of Health   Financial Resource Strain:   . Difficulty of Paying Living Expenses:   Food Insecurity:   . Worried About [employer] in the Last Year:   . Programme researcher, broadcasting/film/video in the Last Year:   Transportation Needs:   . Barista (Medical):   Freight forwarder Lack of Transportation (Non-Medical):   Physical Activity:   . Days of Exercise per Week:   . Minutes of Exercise per Session:   Stress:   . Feeling of Stress :   Social Connections:   . Frequency of Communication with Friends and Family:   . Frequency of Social Gatherings with Friends and Family:   . Attends Religious Services:   . Active Member of Clubs or Organizations:   . Attends Marland Kitchen Meetings:   Banker Marital Status:    No Known Allergies No family history on file.     Current Outpatient Medications (Analgesics):  .  aspirin-acetaminophen-caffeine (EXCEDRIN MIGRAINE) 250-250-65 MG tablet, Take 1 tablet by mouth every 6 (six) hours as needed for headache. .  Ibuprofen-Famotidine (DUEXIS) 800-26.6 MG TABS, Take 1 tablet by mouth 3 (three) times daily as  needed. .  meloxicam (MOBIC) 15 MG tablet, Take 1 tablet (15 mg total) by mouth daily.   Current Outpatient Medications (Other):  .  gabapentin (NEURONTIN) 300 MG capsule, TAKE 1-2 CAPSULES 3 TIMES A DAY .  gabapentin (NEURONTIN) 300 MG capsule, Take 1 capsule (300 mg total) by mouth at bedtime. .  cyclobenzaprine (FLEXERIL) 10 MG tablet, Take 1 tablet (10 mg total) by mouth 3 (three) times daily as needed for muscle spasms. (Patient not taking: Reported on 09/11/2019) .  famotidine (PEPCID) 20 MG tablet, Take 1 tablet (20 mg total) by mouth 2 (two) times daily for 7 days.   Reviewed prior external information including notes and imaging from  primary care provider As well as notes that were available from care  everywhere and other healthcare systems.  Past medical history, social, surgical and family history all reviewed in electronic medical record.  No pertanent information unless stated regarding to the chief complaint.   Review of Systems:  No headache, visual changes, nausea, vomiting, diarrhea, constipation, dizziness, abdominal pain, skin rash, fevers, chills, night sweats, weight loss, swollen lymph nodes, body aches, joint swelling, chest pain, shortness of breath, mood changes. POSITIVE muscle aches  Objective  Blood pressure 120/70, pulse 81, height 5\' 1"  (1.549 m), weight 204 lb (92.5 kg), SpO2 98 %.   General: No apparent distress alert and oriented x3 mood and affect normal, dressed appropriately.  HEENT: Pupils equal, extraocular movements intact  Respiratory: Patient's speak in full sentences and does not appear short of breath  Cardiovascular: No lower extremity edema, non tender, no erythema  Neuro: Cranial nerves II through XII are intact, neurovascularly intact in all extremities with 2+ DTRs and 2+ pulses.  Gait normal with good balance and coordination.  MSK:  Non tender with full range of motion and good stability and symmetric strength and tone of shoulders, elbows, wrist, hip, knee and ankles bilaterally.  Back Exam:  Inspection: Unremarkable  Motion: Flexion 45 deg, Extension 25 deg, Side Bending to 45 deg bilaterally,  Rotation to 45 deg bilaterally  SLR laying: Positive straight leg test at 20 degrees of forward flexion. XSLR laying: positive left  Palpable tenderness: Tender to palpation in the paraspinal musculature left greater than right.Marland Kitchen FABER: Tightness noted.. Sensory change: Gross sensation intact to all lumbar and sacral dermatomes.  Reflexes: 2+ at both patellar tendons, 2+ at achilles tendons, Babinski's downgoing.  Strength at foot  Plantar-flexion: 5/5 Dorsi-flexion: 5/5 Eversion: 5/5 Inversion: 5/5  Leg strength  Quad: 5/5 Hamstring: 5/5 Hip flexor:  5/5 Hip abductors: 5/5  Gait unremarkable.  Osteopathic findings  T9 extended rotated and side bent left L5 flexed rotated and side bent left  Sacrum right on right    Impression and Recommendations:     This case required medical decision making of moderate complexity. The above documentation has been reviewed and is accurate and complete Lyndal Pulley, DO       Note: This dictation was prepared with Dragon dictation along with smaller phrase technology. Any transcriptional errors that result from this process are unintentional.

## 2019-09-11 NOTE — Assessment & Plan Note (Signed)

## 2019-09-11 NOTE — Assessment & Plan Note (Addendum)
Chronic problem with exacerbation.  Toradol and Depo-Medrol given today left lumbar radiculopathy.  On gabapentin, discussed medications including cyclobenzaprine as neededAs well as oral anti-inflammatories such as ibu meloxicam.  Discussed not taking both patient is to increase activity slowly again.  Hold patient out of work until Tuesday of next week.  Worsening symptoms secondary to patient's previous disc herniation we may need to repeat MRI that is now 44 years old.

## 2019-09-11 NOTE — Patient Instructions (Signed)
If not better by Monday, let me know so I can order an MRI.  See me back in 4 weeks.  Ok to [employer].

## 2019-09-18 ENCOUNTER — Telehealth: Payer: Self-pay | Admitting: Family Medicine

## 2019-09-18 NOTE — Telephone Encounter (Signed)
Patient called stating that she has started having muscle spasms. They start at her rectum and go down to her thigh and calf. It has been happening very frequently. Especially when she sits down.  Please advise.

## 2019-09-20 MED ORDER — TIZANIDINE HCL 4 MG PO TABS: 4.0000 mg | ORAL_TABLET | Freq: Every evening | ORAL | 2 refills | Status: AC

## 2019-09-20 NOTE — Telephone Encounter (Signed)
If any weakness or bowel or bladder trouble need to go to ER. Sent in muscle spasm medicine to take at night and do that regularly until we see her again

## 2019-09-22 NOTE — Telephone Encounter (Signed)
Pt returned call, confirms she picked up medicine and started this weekend. I read her the message from Dr. Katrinka Blazing and she stated understanding. Will contact us if any questions.

## 2019-09-22 NOTE — Telephone Encounter (Signed)
Left message for patient regarding recommendations.

## 2019-09-24 ENCOUNTER — Other Ambulatory Visit: Payer: Self-pay

## 2019-09-24 ENCOUNTER — Telehealth: Payer: Self-pay | Admitting: Family Medicine

## 2019-09-24 DIAGNOSIS — M545 Low back pain, unspecified: Secondary | ICD-10-CM

## 2019-09-24 NOTE — Telephone Encounter (Signed)
Spoke with patient letting her know that we have put in order for lumbar MRI. Will be in contact with patient once we have results. Patient will schedule MRI. Phone number to [facility] provided.

## 2019-09-24 NOTE — Telephone Encounter (Signed)
Patient called stating that she is scheduled to see Dr Katrinka Blazing on 10/10/2019 but she would like to know if he would consider ordering an MRI to take a closer look at what is going on. She has been told it could be her back causing her pain but she thinks it is her hip.  Please advise.

## 2019-09-24 NOTE — Telephone Encounter (Signed)
Patient called back stating that she is very claustrophobic and would like to know if something can be sent in for her to take prior to the MRI.

## 2019-10-01 ENCOUNTER — Other Ambulatory Visit: Payer: Self-pay

## 2019-10-01 ENCOUNTER — Encounter: Payer: Self-pay | Admitting: Family Medicine

## 2019-10-01 ENCOUNTER — Ambulatory Visit (INDEPENDENT_AMBULATORY_CARE_PROVIDER_SITE_OTHER): Payer: 59 | Admitting: Family Medicine

## 2019-10-01 VITALS — BP 135/80 | HR 85 | Ht 61.0 in | Wt 202.8 lb

## 2019-10-01 DIAGNOSIS — M545 Low back pain, unspecified: Secondary | ICD-10-CM

## 2019-10-01 DIAGNOSIS — R4586 Emotional lability: Secondary | ICD-10-CM

## 2019-10-01 LAB — POCT URINALYSIS DIP (MANUAL ENTRY)
Bilirubin, UA: NEGATIVE
Blood, UA: NEGATIVE
Glucose, UA: NEGATIVE mg/dL
Ketones, POC UA: NEGATIVE mg/dL
Leukocytes, UA: NEGATIVE
Nitrite, UA: NEGATIVE
Protein Ur, POC: NEGATIVE mg/dL
Spec Grav, UA: 1.02 (ref 1.010–1.025)
Urobilinogen, UA: 0.2 E.U./dL
pH, UA: 6 (ref 5.0–8.0)

## 2019-10-01 NOTE — Patient Instructions (Addendum)
Good to see you today!  Thanks for coming in.  I think your pain is coming from your back.   I'm glad you have a MRI scheduled.  That would be the next step  If you have serve pain or weakness let us or Dr Katrinka Blazing know right away.  If you have any intestinal symptoms let me know  Continue taking the gabapentin and the Ibuprofen.  Can add Aspercreme three times a day rub on your lower back  Follow up with Dr Katrinka Blazing

## 2019-10-01 NOTE — Assessment & Plan Note (Addendum)
Improved despite ongoing back pain

## 2019-10-01 NOTE — Progress Notes (Signed)
    SUBJECTIVE:   CHIEF COMPLAINT / HPI:   BACK ABDOMEN PAIN Since 3 days ago started with back pain that came around to her front lower abdomen and now going down legs first left then right.  Feels better after she urinates but no dysuria or frequency. No nausea and vomiting or diarrhea or change in bowels or change in appetite.  No fever.  Mild lower extremity weakness due to pain.  MOOD Feels well.  No depressive symptoms PHQ 9 = 1  PERTINENT  PMH / PSH: being treated for lumbar pain by Dr Katrinka Blazing.  Has MRI scheduled.  Taking gabapentin and Duexis   OBJECTIVE:   BP 135/80   Pulse 85   Ht 5\' 1"  (1.549 m)   Wt 202 lb 12.8 oz (92 kg)   SpO2 98%   BMI 38.32 kg/m   Mild distress Mobility:able to get up and down from exam table without assistance but mild distress Able to stand on toes and heels No SLR Abdomen: soft and non-tender without masses, organomegaly or hernias noted.  No guarding or rebound Back - mild tenderness bilat lower LS.  No spinous process tenderness   ASSESSMENT/PLAN:   Mood changes Improved despite ongoing back pain   Bilateral low back pain Seems related to her chronic low back pain being worked up by Dr .  No signs of urinary or intestinal causes.  No red flags for urgent evaluation.  Has MRI scheduled next week.  Can try OTC aspercreme in addition to her current prescription analgesics      Katrinka Blazing, MD [facility]

## 2019-10-01 NOTE — Assessment & Plan Note (Signed)
Seems related to her chronic low back pain being worked up by Dr Katrinka Blazing.  No signs of urinary or intestinal causes.  No red flags for urgent evaluation.  Has MRI scheduled next week.  Can try OTC aspercreme in addition to her current prescription analgesics

## 2019-10-06 ENCOUNTER — Telehealth: Payer: Self-pay | Admitting: Family Medicine

## 2019-10-06 MED ORDER — DIAZEPAM 5 MG PO TABS: ORAL_TABLET | ORAL | 0 refills | Status: DC

## 2019-10-06 NOTE — Telephone Encounter (Signed)
Pt has MRI on Wednesday. She has anxiety AND is concerned that her level of pain will make the MRI very difficult. Wants something the will "almost knock her out".  Please call work # first today [phone]. Will be on cell from 12p-1p.

## 2019-10-07 NOTE — Telephone Encounter (Signed)
Spoke with patient regarding rx and recommendations.

## 2019-10-08 ENCOUNTER — Ambulatory Visit
Admission: RE | Admit: 2019-10-08 | Discharge: 2019-10-08 | Disposition: A | Discharge status: Home / Self Care | Payer: 59 | Source: Ambulatory Visit | Attending: Family Medicine | Admitting: Family Medicine

## 2019-10-08 ENCOUNTER — Other Ambulatory Visit: Payer: Self-pay | Admitting: Family Medicine

## 2019-10-08 DIAGNOSIS — G609 Hereditary and idiopathic neuropathy, unspecified: Secondary | ICD-10-CM

## 2019-10-08 DIAGNOSIS — M545 Low back pain, unspecified: Secondary | ICD-10-CM

## 2019-10-10 ENCOUNTER — Encounter: Payer: Self-pay | Admitting: Family Medicine

## 2019-10-10 ENCOUNTER — Ambulatory Visit (INDEPENDENT_AMBULATORY_CARE_PROVIDER_SITE_OTHER): Payer: 59 | Admitting: Family Medicine

## 2019-10-10 ENCOUNTER — Other Ambulatory Visit: Payer: Self-pay

## 2019-10-10 VITALS — BP 140/90 | HR 83 | Ht 61.0 in | Wt 197.0 lb

## 2019-10-10 DIAGNOSIS — M5416 Radiculopathy, lumbar region: Secondary | ICD-10-CM

## 2019-10-10 DIAGNOSIS — M2550 Pain in unspecified joint: Secondary | ICD-10-CM

## 2019-10-10 DIAGNOSIS — M25552 Pain in left hip: Secondary | ICD-10-CM

## 2019-10-10 LAB — COMPREHENSIVE METABOLIC PANEL
ALT: 14 U/L (ref 0–35)
AST: 17 U/L (ref 0–37)
Albumin: 4.1 g/dL (ref 3.5–5.2)
Alkaline Phosphatase: 45 U/L (ref 39–117)
BUN: 11 mg/dL (ref 6–23)
CO2: 29 mEq/L (ref 19–32)
Calcium: 9.1 mg/dL (ref 8.4–10.5)
Chloride: 104 mEq/L (ref 96–112)
Creatinine, Ser: 0.81 mg/dL (ref 0.40–1.20)
GFR: 92.31 mL/min (ref >60.00)
Glucose, Bld: 92 mg/dL (ref 70–99)
Potassium: 3.1 mEq/L — ABNORMAL LOW (ref 3.5–5.1)
Sodium: 140 mEq/L (ref 135–145)
Total Bilirubin: 0.5 mg/dL (ref 0.2–1.2)
Total Protein: 6.7 g/dL (ref 6.0–8.3)

## 2019-10-10 LAB — CBC WITH DIFFERENTIAL/PLATELET
Basophils Absolute: 0 10*3/uL (ref 0.0–0.1)
Basophils Relative: 0.4 % (ref 0.0–3.0)
Eosinophils Absolute: 0 10*3/uL (ref 0.0–0.7)
Eosinophils Relative: 0.6 % (ref 0.0–5.0)
HCT: 37.6 % (ref 36.0–46.0)
Hemoglobin: 12.8 g/dL (ref 12.0–15.0)
Lymphocytes Relative: 38.2 % (ref 12.0–46.0)
Lymphs Abs: 2.9 10*3/uL (ref 0.7–4.0)
MCHC: 34.1 g/dL (ref 30.0–36.0)
MCV: 83.2 fl (ref 78.0–100.0)
Monocytes Absolute: 0.5 10*3/uL (ref 0.1–1.0)
Monocytes Relative: 6.7 % (ref 3.0–12.0)
Neutro Abs: 4.1 10*3/uL (ref 1.4–7.7)
Neutrophils Relative %: 54.1 % (ref 43.0–77.0)
Platelets: 331 10*3/uL (ref 150.0–400.0)
RBC: 4.52 Mil/uL (ref 3.87–5.11)
RDW: 13.7 % (ref 11.5–15.5)
WBC: 7.7 10*3/uL (ref 4.0–10.5)

## 2019-10-10 LAB — FERRITIN: Ferritin: 122.7 ng/mL (ref 10.0–291.0)

## 2019-10-10 LAB — VITAMIN D 25 HYDROXY (VIT D DEFICIENCY, FRACTURES): VITD: 9.99 ng/mL — ABNORMAL LOW (ref 30.00–100.00)

## 2019-10-10 LAB — SEDIMENTATION RATE: Sed Rate: 7 mm/hr (ref 0–20)

## 2019-10-10 LAB — IBC PANEL
Iron: 21 ug/dL — ABNORMAL LOW (ref 42–145)
Saturation Ratios: 5.7 % — ABNORMAL LOW (ref 20.0–50.0)
Transferrin: 264 mg/dL (ref 212.0–360.0)

## 2019-10-10 LAB — C-REACTIVE PROTEIN: CRP: <1 mg/dL (ref 0.5–20.0)

## 2019-10-10 LAB — URIC ACID: Uric Acid, Serum: 5.2 mg/dL (ref 2.4–7.0)

## 2019-10-10 LAB — TSH: TSH: 1.13 u[IU]/mL (ref 0.35–4.50)

## 2019-10-10 MED ORDER — TRAMADOL HCL 50 MG PO TABS: 50.0000 mg | ORAL_TABLET | Freq: Four times a day (QID) | ORAL | 0 refills | Status: DC | PRN

## 2019-10-10 NOTE — Progress Notes (Signed)
[facility] [address] [place] Phone: [phone] Subjective:   I [name] am serving as a Education administrator for Dr. Hulan Saas.  This visit occurred during the SARS-CoV-2 public health emergency.  Safety protocols were in place, including screening questions prior to the visit, additional usage of staff PPE, and extensive cleaning of exam room while observing appropriate contact time as indicated for disinfecting solutions.   I'm seeing this patient by the request  of:  Chambliss, Jeb Levering, MD  CC: Low back pain follow-up  [id number]  Joanna [name] is a 44 y.o. female coming in with complaint of back pain. Last seen on 09/11/2019 for OMT. Patient states she has not been doing well. Left leg pain that feels like "something is sticking out". Patient can't sleep or sit on the left side and the pain is keeping her up at night.  Patient was supposed to get an MRI previously but was unable to tolerate laying in the MRI for an extended amount of time even with Valium.  Patient would like to have the imaging done but is unable to tolerate the MRI.  Wanting to know if there is any other options.  Has not noticed more of the pain seems to be now located on the lateral aspect of the hip.      Past Medical History:  Diagnosis Date  . Arthritis   . Migraine    Past Surgical History:  Procedure Laterality Date  . ABDOMINAL HYSTERECTOMY    . BREAST SURGERY    . REDUCTION MAMMAPLASTY Bilateral 2001   Social History   Socioeconomic History  . Marital status: Legally Separated    Spouse name: Not on file  . Number of children: Not on file  . Years of education: Not on file  . Highest education level: Not on file  Occupational History  . Not on file  Tobacco Use  . Smoking status: Former Research scientist (life sciences)  . Smokeless tobacco: Never Used  Substance and Sexual Activity  . Alcohol use: No  . Drug use: No  . Sexual activity: Yes    Birth  control/protection: Surgical  Other Topics Concern  . Not on file  Social History Narrative   Works at [employer]    Daughter - 2008.   Goes to [employer] school   Social Determinants of Health   Financial Resource Strain:   . Difficulty of Paying Living Expenses:   Food Insecurity:   . Worried About Charity fundraiser in the Last Year:   . Arboriculturist in the Last Year:   Transportation Needs:   . Film/video editor (Medical):   Marland Kitchen Lack of Transportation (Non-Medical):   Physical Activity:   . Days of Exercise per Week:   . Minutes of Exercise per Session:   Stress:   . Feeling of Stress :   Social Connections:   . Frequency of Communication with Friends and Family:   . Frequency of Social Gatherings with Friends and Family:   . Attends Religious Services:   . Active Member of Clubs or Organizations:   . Attends Archivist Meetings:   Marland Kitchen Marital Status:    No Known Allergies No family history on file.     Current Outpatient Medications (Analgesics):  .  aspirin-acetaminophen-caffeine (EXCEDRIN MIGRAINE) 250-250-65 MG tablet, Take 1 tablet by mouth every 6 (six) hours as needed for headache. .  Ibuprofen-Famotidine (DUEXIS)  800-26.6 MG TABS, Take 1 tablet by mouth 3 (three) times daily as needed. .  meloxicam (MOBIC) 15 MG tablet, Take 1 tablet (15 mg total) by mouth daily. .  traMADol (ULTRAM) 50 MG tablet, Take 1 tablet (50 mg total) by mouth every 6 (six) hours as needed for up to 5 days for severe pain.   Current Outpatient Medications (Other):  .  cyclobenzaprine (FLEXERIL) 10 MG tablet, Take 1 tablet (10 mg total) by mouth 3 (three) times daily as needed for muscle spasms. .  diazepam (VALIUM) 5 MG tablet, One tab by mouth, 2 hours before procedure. .  gabapentin (NEURONTIN) 300 MG capsule, Take 1 capsule (300 mg total) by mouth at bedtime. .  gabapentin (NEURONTIN) 300 MG capsule, TAKE 1-2 CAPSULES 3 TIMES A DAY .   famotidine (PEPCID) 20 MG tablet, Take 1 tablet (20 mg total) by mouth 2 (two) times daily for 7 days.   Reviewed prior external information including notes and imaging from  primary care provider As well as notes that were available from care everywhere and other healthcare systems.  Past medical history, social, surgical and family history all reviewed in electronic medical record.  No pertanent information unless stated regarding to the chief complaint.   Review of Systems:  No headache, visual changes, nausea, vomiting, diarrhea, constipation, dizziness, abdominal pain, skin rash, fevers, chills, night sweats, weight loss, swollen lymph nodes, body aches, joint swelling, chest pain, shortness of breath, mood changes. POSITIVE muscle aches  Objective  Blood pressure 140/90, pulse 83, height 5\' 1"  (1.549 m), weight 197 lb (89.4 kg), SpO2 98 %.   General: No apparent distress alert and oriented x3 mood and affect normal, dressed appropriately.  HEENT: Pupils equal, extraocular movements intact  Respiratory: Patient's speak in full sentences and does not appear short of breath  Cardiovascular: No lower extremity edema, non tender, no erythema  Deep tendon is minorly decreased with the Achilles on the left compared to the contralateral side. Gait antalgic Favoring the left hip significantly.  Patient does have fullness noted in the buttocks area.  Does not truly feel like a mass though.  Seems to be more lateral.  Mild to severe tenderness over the greater trochanteric area and in the buttocks region.  No pain in the groin with internal rotation of the hip noted.  Very mild weakness of the left leg compared to the contralateral side but does have a positive straight leg on the left    Impression and Recommendations:     This case required medical decision making of moderate complexity. The above documentation has been reviewed and is accurate and complete , DO        Note: This dictation was prepared with Dragon dictation along with smaller phrase technology. Any transcriptional errors that result from this process are unintentional.

## 2019-10-10 NOTE — Assessment & Plan Note (Addendum)
Patient continues to have symptoms that is consistent with more of a lumbar radiculopathy but now there is seems to be more of a fullness in the buttocks area.  This does not appear to be quite as much as the bursitis even though it is fairly lateral.  Could be secondary to muscle spasm from the nerve but I do feel that with ice needing MRI of the lumbar spine we will get an MRI of the pelvis with and without contrast as well to further evaluate.  Patient though is having unfortunately significant anxiety secondary to be MRI.  Also having too much pain to be able to do anything on a regular basis.  We will send patient into the hospital with general anesthesia for the MRI.  Patient will follow up after the imaging.  Labs ordered as well today.  Total time with patient today 40 minutes.

## 2019-10-10 NOTE — Patient Instructions (Addendum)
Good to see you Take tramadol only at night Left hip xray labs today You will receive a call to schedule MRI We will discuss after MRI

## 2019-10-13 LAB — PTH, INTACT AND CALCIUM
Calcium: 9.5 mg/dL (ref 8.6–10.2)
PTH: 21 pg/mL (ref 14–64)

## 2019-10-13 LAB — ANA: Anti Nuclear Antibody (ANA): NEGATIVE

## 2019-10-13 LAB — CYCLIC CITRUL PEPTIDE ANTIBODY, IGG: Cyclic Citrullin Peptide Ab: <16 UNITS

## 2019-10-13 LAB — RHEUMATOID FACTOR: Rheumatoid fact SerPl-aCnc: <14 IU/mL (ref <14)

## 2019-10-13 LAB — ANGIOTENSIN CONVERTING ENZYME: Angiotensin-Converting Enzyme: 15 U/L (ref 9–67)

## 2019-10-13 LAB — CALCIUM, IONIZED: Calcium, Ion: 5.09 mg/dL (ref 4.8–5.6)

## 2019-10-14 ENCOUNTER — Other Ambulatory Visit: Payer: Self-pay

## 2019-10-14 ENCOUNTER — Ambulatory Visit (INDEPENDENT_AMBULATORY_CARE_PROVIDER_SITE_OTHER): Payer: 59

## 2019-10-14 ENCOUNTER — Telehealth: Payer: Self-pay

## 2019-10-14 DIAGNOSIS — M25552 Pain in left hip: Secondary | ICD-10-CM | POA: Diagnosis not present

## 2019-10-14 MED ORDER — VITAMIN D (ERGOCALCIFEROL) 1.25 MG (50000 UNIT) PO CAPS
50000.0000 [IU] | ORAL_CAPSULE | ORAL | 0 refills | Status: DC
Start: 2019-10-14 — End: 2019-12-29

## 2019-10-14 NOTE — Telephone Encounter (Signed)
Patient states her pharmacy does not have the vitamin D yet.

## 2019-10-14 NOTE — Telephone Encounter (Signed)
Left message for patient that Vit D has been called into Rx.

## 2019-10-20 ENCOUNTER — Other Ambulatory Visit: Payer: Self-pay

## 2019-10-20 ENCOUNTER — Telehealth: Payer: Self-pay | Admitting: Family Medicine

## 2019-10-20 NOTE — Telephone Encounter (Signed)
Patient called requesting xray and MRI results. She said that she is in a lot of pain and was not able to go to work today.  Please advise.

## 2019-11-05 ENCOUNTER — Telehealth: Payer: Self-pay | Admitting: Family Medicine

## 2019-11-05 NOTE — Telephone Encounter (Signed)
Pt called. She is taking Duexis but needs something more for pain during the day so that she is able to work with less pain. She did take some tramadol, which took the edge off but she does not think Dr. Katrinka Blazing would want her to continue this during the day. [facility] [phone]

## 2019-11-05 NOTE — Telephone Encounter (Signed)
For daytime pain I would recommend effexor, cymbalta or vilazodone so we would not make her sleepy. Per her chart she has been on 2 of them in the past. Would she like to try them or do the new one?

## 2019-11-06 ENCOUNTER — Other Ambulatory Visit: Payer: Self-pay

## 2019-11-06 MED ORDER — VILAZODONE HCL 10 MG PO TABS
10.0000 mg | ORAL_TABLET | Freq: Every day | ORAL | 1 refills | Status: DC
Start: 2019-11-06 — End: 2019-11-11

## 2019-11-06 NOTE — Telephone Encounter (Signed)
Spoke with patient. She would like to try vilazodone. Rx called into pharmacy.

## 2019-11-06 NOTE — Telephone Encounter (Signed)
Left message for patient to call back to discuss medications.

## 2019-11-07 NOTE — Telephone Encounter (Signed)
Pt called, the vilazodone is "pending" insurance per [facility]. Not sure if we needed to do anything to assist her in getting this med approved.

## 2019-11-11 ENCOUNTER — Other Ambulatory Visit: Payer: Self-pay

## 2019-11-11 MED ORDER — VILAZODONE HCL 10 MG PO TABS: 10.0000 mg | ORAL_TABLET | Freq: Every day | ORAL | 1 refills | Status: DC

## 2019-11-11 NOTE — Telephone Encounter (Signed)
Left message that medication has been called in again to generate PA. Have not seen prior auth sent from pharmacy yet.

## 2019-11-20 ENCOUNTER — Ambulatory Visit (INDEPENDENT_AMBULATORY_CARE_PROVIDER_SITE_OTHER): Payer: 59 | Admitting: Family Medicine

## 2019-11-20 ENCOUNTER — Other Ambulatory Visit: Payer: Self-pay

## 2019-11-20 ENCOUNTER — Encounter: Payer: Self-pay | Admitting: Family Medicine

## 2019-11-20 DIAGNOSIS — M5416 Radiculopathy, lumbar region: Secondary | ICD-10-CM | POA: Diagnosis not present

## 2019-11-20 NOTE — Patient Instructions (Signed)
I agree lets wait Get the MRI lets see what is going on Try the duexis

## 2019-11-20 NOTE — Progress Notes (Signed)
[name] [address] [place] [address] Phone: [phone] Subjective:   I Joanna [name] am serving as a Neurosurgeon for Dr. Antoine Primas.  This visit occurred during the SARS-CoV-2 public health emergency.  Safety protocols were in place, including screening questions prior to the visit, additional usage of staff PPE, and extensive cleaning of exam room while observing appropriate contact time as indicated for disinfecting solutions.   I'm seeing this patient by the request  of:  Carney Living, MD  CC: Low back pain and left hip pain follow-up  LPN:[id number]   10/10/2019 Patient continues to have symptoms that is consistent with more of a lumbar radiculopathy but now there is seems to be more of a fullness in the buttocks area.  This does not appear to be quite as much as the bursitis even though it is fairly lateral.  Could be secondary to muscle spasm from the nerve but I do feel that with ice needing MRI of the lumbar spine we will get an MRI of the pelvis with and without contrast as well to further evaluate.  Patient though is having unfortunately significant anxiety secondary to be MRI.  Also having too much pain to be able to do anything on a regular basis.  We will send patient into the hospital with general anesthesia for the MRI.  Patient will follow up after the imaging.  Labs ordered as well today.  Total time with patient today 40 minutes.  Update 11/20/2019 Joanna [name] is a 44 y.o. female coming in with complaint of left hip pain. Patient states she is feeling better. Still can sit or lay on the left side without pain.  Patient states that he still feels like there is something deep inside there.  States that this is what is affecting her mostly on a regular basis.  Rates the severity of pain sometimes is 10 out of 10.       Past Medical History:  Diagnosis Date  . Arthritis   . Migraine    Past Surgical History:    Procedure Laterality Date  . ABDOMINAL HYSTERECTOMY    . BREAST SURGERY    . REDUCTION MAMMAPLASTY Bilateral 2001   Social History   Socioeconomic History  . Marital status: Legally Separated    Spouse name: Not on file  . Number of children: Not on file  . Years of education: Not on file  . Highest education level: Not on file  Occupational History  . Not on file  Tobacco Use  . Smoking status: Former Games developer  . Smokeless tobacco: Never Used  Vaping Use  . Vaping Use: Never used  Substance and Sexual Activity  . Alcohol use: No  . Drug use: No  . Sexual activity: Yes    Birth control/protection: Surgical  Other Topics Concern  . Not on file  Social History Narrative   Works at [employer]    Daughter - 2008.   Goes to [employer] school   Social Determinants of Health   Financial Resource Strain:   . Difficulty of Paying Living Expenses:   Food Insecurity:   . Worried About Programme researcher, broadcasting/film/video in the Last Year:   . Barista in the Last Year:   Transportation Needs:   . Freight forwarder (Medical):   Marland Kitchen Lack of Transportation (Non-Medical):   Physical Activity:   . Days of Exercise per Week:   .  Minutes of Exercise per Session:   Stress:   . Feeling of Stress :   Social Connections:   . Frequency of Communication with Friends and Family:   . Frequency of Social Gatherings with Friends and Family:   . Attends Religious Services:   . Active Member of Clubs or Organizations:   . Attends Archivist Meetings:   Marland Kitchen Marital Status:    No Known Allergies No family history on file.     Current Outpatient Medications (Analgesics):  .  aspirin-acetaminophen-caffeine (EXCEDRIN MIGRAINE) 250-250-65 MG tablet, Take 1 tablet by mouth every 6 (six) hours as needed for headache. .  Ibuprofen-Famotidine (DUEXIS) 800-26.6 MG TABS, Take 1 tablet by mouth 3 (three) times daily as needed. .  meloxicam (MOBIC) 15 MG tablet,  Take 1 tablet (15 mg total) by mouth daily.   Current Outpatient Medications (Other):  .  cyclobenzaprine (FLEXERIL) 10 MG tablet, Take 1 tablet (10 mg total) by mouth 3 (three) times daily as needed for muscle spasms. .  diazepam (VALIUM) 5 MG tablet, One tab by mouth, 2 hours before procedure. .  gabapentin (NEURONTIN) 300 MG capsule, Take 1 capsule (300 mg total) by mouth at bedtime. .  gabapentin (NEURONTIN) 300 MG capsule, TAKE 1-2 CAPSULES 3 TIMES A DAY .  Vilazodone HCl (VIIBRYD) 10 MG TABS, Take 1 tablet (10 mg total) by mouth daily. Take 10mg  daily for 2 weeks and then 20 mg daily there after. .  Vitamin D, Ergocalciferol, (DRISDOL) 1.25 MG (50000 UNIT) CAPS capsule, Take 1 capsule (50,000 Units total) by mouth every 7 (seven) days. .  famotidine (PEPCID) 20 MG tablet, Take 1 tablet (20 mg total) by mouth 2 (two) times daily for 7 days.   Reviewed prior external information including notes and imaging from  primary care provider As well as notes that were available from care everywhere and other healthcare systems.  Past medical history, social, surgical and family history all reviewed in electronic medical record.  No pertanent information unless stated regarding to the chief complaint.   Review of Systems:  No headache, visual changes, nausea, vomiting, diarrhea, constipation, dizziness, abdominal pain, skin rash, fevers, chills, night sweats, weight loss, swollen lymph nodes,  joint swelling, chest pain, shortness of breath, mood changes. POSITIVE muscle aches, body aches  Objective  Blood pressure 128/86, pulse 82, height 5\' 1"  (1.549 m), weight 191 lb (86.6 kg), SpO2 98 %.   General: No apparent distress alert and oriented x3 mood and affect normal, dressed appropriately.  HEENT: Pupils equal, extraocular movements intact  Respiratory: Patient's speak in full sentences and does not appear short of breath  Cardiovascular: No lower extremity edema, non tender, no erythema    Neuro: Cranial nerves II through XII are intact, neurovascularly intact in all extremities with 2+ DTRs and 2+ pulses.  Gait n severely antalgic Patient still guarding the left hip.  Still fullness over the buttocks region on the left side.  No true mass appreciated but does feel significantly different than the contralateral side.  Patient continues to have back pain.  With mild radicular symptoms with straight leg test on the left.   Impression and Recommendations:     The above documentation has been reviewed and is accurate and complete Joanna Pulley, DO       Note: This dictation was prepared with Dragon dictation along with smaller phrase technology. Any transcriptional errors that result from this process are unintentional.

## 2019-11-20 NOTE — Assessment & Plan Note (Signed)
Continue radicular symptoms.  Seems to be on the lateral aspect of the hip.  Patient's pain is unrelenting at the moment.  Patient has failed all other conservative therapy and we are awaiting now the imaging and depending on the findings we will discuss medical management thereafter.

## 2019-12-01 ENCOUNTER — Other Ambulatory Visit (HOSPITAL_COMMUNITY)
Admission: RE | Admit: 2019-12-01 | Discharge: 2019-12-01 | Disposition: A | Discharge status: Home / Self Care | Payer: 59 | Source: Ambulatory Visit | Attending: Family Medicine | Admitting: Family Medicine

## 2019-12-01 DIAGNOSIS — Z01812 Encounter for preprocedural laboratory examination: Secondary | ICD-10-CM | POA: Diagnosis present

## 2019-12-01 DIAGNOSIS — Z20822 Contact with and (suspected) exposure to covid-19: Secondary | ICD-10-CM | POA: Diagnosis not present

## 2019-12-01 LAB — SARS CORONAVIRUS 2 (TAT 6-24 HRS): SARS Coronavirus 2: NEGATIVE

## 2019-12-02 ENCOUNTER — Other Ambulatory Visit: Payer: Self-pay

## 2019-12-02 ENCOUNTER — Encounter (HOSPITAL_COMMUNITY): Payer: Self-pay

## 2019-12-02 NOTE — Progress Notes (Signed)
Spoke with pt for pre-op call. Pt denies cardiac history, HTN or Diabetes.  Covid test done on 12/01/19 and it's negative. Pt states she has been in quarantine since test was done and understands to stay in quarantine until she comes to the hospital on Thursday.

## 2019-12-04 ENCOUNTER — Ambulatory Visit (HOSPITAL_COMMUNITY)
Admission: RE | Admit: 2019-12-04 | Discharge: 2019-12-04 | Disposition: A | Discharge status: Home / Self Care | Payer: 59 | Source: Ambulatory Visit | Attending: Family Medicine | Admitting: Family Medicine

## 2019-12-04 ENCOUNTER — Ambulatory Visit (HOSPITAL_COMMUNITY)
Admission: RE | Admit: 2019-12-04 | Discharge: 2019-12-04 | Disposition: A | Discharge status: Home / Self Care | Payer: 59 | Attending: Family Medicine | Admitting: Family Medicine

## 2019-12-04 ENCOUNTER — Ambulatory Visit (HOSPITAL_COMMUNITY): Payer: 59 | Admitting: Certified Registered"

## 2019-12-04 ENCOUNTER — Encounter (HOSPITAL_COMMUNITY): Payer: Self-pay

## 2019-12-04 ENCOUNTER — Encounter (HOSPITAL_COMMUNITY): Admission: RE | Disposition: A | Discharge status: Home / Self Care | Payer: Self-pay | Source: Home / Self Care

## 2019-12-04 ENCOUNTER — Other Ambulatory Visit: Payer: Self-pay

## 2019-12-04 DIAGNOSIS — M545 Low back pain: Secondary | ICD-10-CM | POA: Diagnosis present

## 2019-12-04 DIAGNOSIS — M5117 Intervertebral disc disorders with radiculopathy, lumbosacral region: Secondary | ICD-10-CM | POA: Diagnosis not present

## 2019-12-04 DIAGNOSIS — M25552 Pain in left hip: Secondary | ICD-10-CM | POA: Insufficient documentation

## 2019-12-04 DIAGNOSIS — M4807 Spinal stenosis, lumbosacral region: Secondary | ICD-10-CM | POA: Insufficient documentation

## 2019-12-04 HISTORY — DX: Nausea with vomiting, unspecified: R11.2

## 2019-12-04 HISTORY — DX: Other specified postprocedural states: Z98.890

## 2019-12-04 HISTORY — DX: Anemia, unspecified: D64.9

## 2019-12-04 HISTORY — DX: Cardiac murmur, unspecified: R01.1

## 2019-12-04 HISTORY — PX: RADIOLOGY WITH ANESTHESIA: SHX6223

## 2019-12-04 HISTORY — DX: Anxiety disorder, unspecified: F41.9

## 2019-12-04 HISTORY — DX: Depression, unspecified: F32.A

## 2019-12-04 LAB — BASIC METABOLIC PANEL
Anion gap: 8 (ref 5–15)
BUN: 10 mg/dL (ref 6–20)
CO2: 23 mmol/L (ref 22–32)
Calcium: 8.8 mg/dL — ABNORMAL LOW (ref 8.9–10.3)
Chloride: 107 mmol/L (ref 98–111)
Creatinine, Ser: 0.77 mg/dL (ref 0.44–1.00)
GFR calc Af Amer: >60 mL/min (ref >60)
GFR calc non Af Amer: >60 mL/min (ref >60)
Glucose, Bld: 92 mg/dL (ref 70–99)
Potassium: 3.2 mmol/L — ABNORMAL LOW (ref 3.5–5.1)
Sodium: 138 mmol/L (ref 135–145)

## 2019-12-04 LAB — CBC
HCT: 38 % (ref 36.0–46.0)
Hemoglobin: 12.9 g/dL (ref 12.0–15.0)
MCH: 28.7 pg (ref 26.0–34.0)
MCHC: 33.9 g/dL (ref 30.0–36.0)
MCV: 84.6 fL (ref 80.0–100.0)
Platelets: 335 10*3/uL (ref 150–400)
RBC: 4.49 MIL/uL (ref 3.87–5.11)
RDW: 12.7 % (ref 11.5–15.5)
WBC: 5.5 10*3/uL (ref 4.0–10.5)
nRBC: 0 % (ref 0.0–0.2)

## 2019-12-04 SURGERY — MRI WITH ANESTHESIA
Anesthesia: General

## 2019-12-04 MED ORDER — ONDANSETRON HCL 4 MG/2ML IJ SOLN
INTRAMUSCULAR | Status: DC | PRN
  Administered 2019-12-04: 4 mg via INTRAVENOUS

## 2019-12-04 MED ORDER — GADOBUTROL 1 MMOL/ML IV SOLN
8.5000 mL | Freq: Once | INTRAVENOUS | Status: AC | PRN
  Administered 2019-12-04: 8.5 mL via INTRAVENOUS

## 2019-12-04 MED ORDER — MIDAZOLAM HCL 2 MG/2ML IJ SOLN
INTRAMUSCULAR | Status: DC | PRN
  Administered 2019-12-04: 2 mg via INTRAVENOUS

## 2019-12-04 MED ORDER — FENTANYL CITRATE (PF) 100 MCG/2ML IJ SOLN
INTRAMUSCULAR | Status: DC | PRN
  Administered 2019-12-04: 50 ug via INTRAVENOUS

## 2019-12-04 MED ORDER — LIDOCAINE 2% (20 MG/ML) 5 ML SYRINGE
INTRAMUSCULAR | Status: DC | PRN
  Administered 2019-12-04: 100 mg via INTRAVENOUS

## 2019-12-04 MED ORDER — ONDANSETRON HCL 4 MG/2ML IJ SOLN: 4.0000 mg | Freq: Once | INTRAMUSCULAR | Status: DC | PRN

## 2019-12-04 MED ORDER — CHLORHEXIDINE GLUCONATE 0.12 % MT SOLN
15.0000 mL | Freq: Once | OROMUCOSAL | Status: AC
  Administered 2019-12-04: 15 mL via OROMUCOSAL
  Filled 2019-12-04: qty 15

## 2019-12-04 MED ORDER — MEPERIDINE HCL 25 MG/ML IJ SOLN: 6.2500 mg | INTRAMUSCULAR | Status: DC | PRN

## 2019-12-04 MED ORDER — DEXAMETHASONE SODIUM PHOSPHATE 10 MG/ML IJ SOLN
INTRAMUSCULAR | Status: DC | PRN
  Administered 2019-12-04: 5 mg via INTRAVENOUS

## 2019-12-04 MED ORDER — HYDROMORPHONE HCL 1 MG/ML IJ SOLN
0.2500 mg | INTRAMUSCULAR | Status: DC | PRN
  Administered 2019-12-04 (×2): 0.5 mg via INTRAVENOUS

## 2019-12-04 MED ORDER — ORAL CARE MOUTH RINSE: 15.0000 mL | Freq: Once | OROMUCOSAL | Status: AC

## 2019-12-04 MED ORDER — PHENYLEPHRINE 40 MCG/ML (10ML) SYRINGE FOR IV PUSH (FOR BLOOD PRESSURE SUPPORT)
PREFILLED_SYRINGE | INTRAVENOUS | Status: DC | PRN
  Administered 2019-12-04 (×2): 120 ug via INTRAVENOUS

## 2019-12-04 MED ORDER — LACTATED RINGERS IV SOLN: INTRAVENOUS | Status: DC

## 2019-12-04 MED ORDER — PROPOFOL 10 MG/ML IV BOLUS
INTRAVENOUS | Status: DC | PRN
  Administered 2019-12-04: 150 mg via INTRAVENOUS

## 2019-12-04 MED ORDER — HYDROMORPHONE HCL 1 MG/ML IJ SOLN
INTRAMUSCULAR | Status: AC
  Filled 2019-12-04: qty 1

## 2019-12-04 NOTE — Anesthesia Preprocedure Evaluation (Signed)
Anesthesia Evaluation  Patient identified by MRN, date of birth, ID band Patient awake    Reviewed: Allergy & Precautions, NPO status , Patient's Chart, lab work & pertinent test results  History of Anesthesia Complications (+) PONV  Airway Mallampati: I  TM Distance: >3 FB Neck ROM: Full    Dental   Pulmonary former smoker,    Pulmonary exam normal        Cardiovascular Normal cardiovascular exam     Neuro/Psych Anxiety Depression    GI/Hepatic   Endo/Other    Renal/GU      Musculoskeletal   Abdominal   Peds  Hematology   Anesthesia Other Findings   Reproductive/Obstetrics                             Anesthesia Physical Anesthesia Plan  ASA: II  Anesthesia Plan: General   Post-op Pain Management:    Induction: Intravenous  PONV Risk Score and Plan: 3 and Ondansetron, Midazolam and Treatment may vary due to age or medical condition  Airway Management Planned: LMA  Additional Equipment:   Intra-op Plan:   Post-operative Plan: Extubation in OR  Informed Consent: I have reviewed the patients History and Physical, chart, labs and discussed the procedure including the risks, benefits and alternatives for the proposed anesthesia with the patient or authorized representative who has indicated his/her understanding and acceptance.       Plan Discussed with: CRNA and Surgeon  Anesthesia Plan Comments:         Anesthesia Quick Evaluation

## 2019-12-04 NOTE — Anesthesia Procedure Notes (Signed)
Procedure Name: LMA Insertion Date/Time: 12/04/2019 10:29 AM Performed by: De Nurse, CRNA Pre-anesthesia Checklist: Patient identified, Emergency Drugs available, Suction available and Patient being monitored Patient Re-evaluated:Patient Re-evaluated prior to induction Oxygen Delivery Method: Circle System Utilized Preoxygenation: Pre-oxygenation with 100% oxygen Induction Type: IV induction Ventilation: Mask ventilation without difficulty LMA: LMA inserted LMA Size: 4.0 Number of attempts: 1 Placement Confirmation: positive ETCO2 Tube secured with: Tape Dental Injury: Teeth and Oropharynx as per pre-operative assessment

## 2019-12-04 NOTE — Anesthesia Postprocedure Evaluation (Signed)
Anesthesia Post Note  Patient: Joanna [name]  Procedure(s) Performed: MRI PELVIS WITH AND WITHOUT CONTRAST,LUMBER SPINE WITHOUT CONTRAST (N/A )     Patient location during evaluation: PACU Anesthesia Type: General Level of consciousness: awake and alert and oriented Pain management: pain level controlled Vital Signs Assessment: post-procedure vital signs reviewed and stable Respiratory status: spontaneous breathing, nonlabored ventilation and respiratory function stable Cardiovascular status: blood pressure returned to baseline and stable Postop Assessment: no apparent nausea or vomiting Anesthetic complications: no   No complications documented.  Last Vitals:  Vitals:   12/04/19 1205 12/04/19 1215  BP: 128/73 124/77  Pulse: 78 77  Resp: 19 13  Temp:  36.8 C  SpO2: 98% 96%    Last Pain:  Vitals:   12/04/19 1215  TempSrc:   PainSc: 3                  Jayliani Wanner A.

## 2019-12-04 NOTE — Transfer of Care (Signed)
Immediate Anesthesia Transfer of Care Note  Patient: Joanna [name]  Procedure(s) Performed: MRI PELVIS WITH AND WITHOUT CONTRAST,LUMBER SPINE WITHOUT CONTRAST (N/A )  Patient Location: PACU  Anesthesia Type:General  Level of Consciousness: awake, alert  and oriented  Airway & Oxygen Therapy: Patient Spontanous Breathing  Post-op Assessment: Report given to RN  Post vital signs: Reviewed and stable  Last Vitals:  Vitals Value Taken Time  BP 101/90 12/04/19 1135  Temp    Pulse 95 12/04/19 1135  Resp 15 12/04/19 1135  SpO2 93 % 12/04/19 1135  Vitals shown include unvalidated device data.  Last Pain:  Vitals:   12/04/19 0813  TempSrc:   PainSc: 3       Patients Stated Pain Goal: 4 (12/04/19 0813)  Complications: No complications documented.

## 2019-12-05 ENCOUNTER — Other Ambulatory Visit: Payer: Self-pay | Admitting: Family Medicine

## 2019-12-05 ENCOUNTER — Encounter (HOSPITAL_COMMUNITY): Payer: Self-pay | Admitting: Radiology

## 2019-12-05 ENCOUNTER — Telehealth: Payer: Self-pay | Admitting: Family Medicine

## 2019-12-05 NOTE — Telephone Encounter (Signed)
Pt calling for MRI results ( done 6/24 ).

## 2019-12-05 NOTE — Telephone Encounter (Signed)
Talked to patient. Virtual visit scheduled.

## 2019-12-12 ENCOUNTER — Other Ambulatory Visit: Payer: Self-pay

## 2019-12-12 ENCOUNTER — Other Ambulatory Visit: Payer: Self-pay | Admitting: Family Medicine

## 2019-12-12 MED ORDER — TRAMADOL HCL 50 MG PO TABS: 50.0000 mg | ORAL_TABLET | Freq: Four times a day (QID) | ORAL | 0 refills | Status: AC | PRN

## 2019-12-12 NOTE — Telephone Encounter (Signed)
Please advise per Dr. Michaelle Copas absence. Thank you.   Patient having leg pain in her thigh down to her knee. States it is like a stabbing pain. Patient states she is taking the Duexis, but sometimes takes two at a time even though she knows she should not but the pain is so bad. Patient stated Dr. Katrinka Blazing told her to call if she was having worsening pain. Patient states Dr. Katrinka Blazing put her on Tramadol in the past just a small supply to help cut through the pain and was wondering if she could get that again.   Pharmacy correct on file

## 2019-12-21 NOTE — Progress Notes (Signed)
Virtual Visit via Video Note  I connected with Joanna [name] on 12/21/19 at  4:15 PM EDT by a video enabled telemedicine application and verified that I am speaking with the correct person using two identifiers.  Location: Patient: in home setting, alone  Provider: in office setting   I discussed the limitations of evaluation and management by telemedicine and the availability of in person appointments. The patient expressed understanding and agreed to proceed.  History of Present Illness: Patient continues to have left-sided back pain with radicular symptoms.  Patient failed all conservative therapy and was sent for an MRI  MRI IMPRESSION: 1. 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 2. Small amount of free pelvic fluid. 3. By 2.2 by 1.2 cm right ovarian cyst with fluid signal Characteristics.  Also found to have tendonitis of right hamstring with small partial tear   Continues to have problems overall though.  Patient states that it is affecting daily activities and waking her up at night.  Medications are not helpful.  Observations/Objective: Alert oriented x3  Assessment and Plan: Left-sided lumbar radiculopathy that is consistent with the L5-S1 central and left lateral recess disc protrusion causing nerve impingement.  Patient encouraged to consider the possibility of epidural.  Patient has been given information of this as well as the potential for the referral for surgery but I think that that would be significantly more invasive than likely not necessary at the moment.  Patient will increase activity slowly and will ask questions then follow-up with me again if she would like to do the epidural or the referral.  Patient is in agreement with the plan.   Follow Up Instructions: Follow-up when she decides on if she would like the epidural    I  discussed the assessment and treatment plan with the patient. The patient was provided an opportunity to ask questions and all were answered. The patient agreed with the plan and demonstrated an understanding of the instructions.   The patient was advised to call back or seek an in-person evaluation if the symptoms worsen or if the condition fails to improve as anticipated.  I provided 20 minutes of face-to-face time during this encounter.   Judi Saa, DO

## 2019-12-22 ENCOUNTER — Ambulatory Visit (INDEPENDENT_AMBULATORY_CARE_PROVIDER_SITE_OTHER): Payer: 59 | Admitting: Family Medicine

## 2019-12-22 ENCOUNTER — Encounter: Payer: Self-pay | Admitting: Family Medicine

## 2019-12-22 DIAGNOSIS — M5416 Radiculopathy, lumbar region: Secondary | ICD-10-CM

## 2019-12-22 NOTE — Assessment & Plan Note (Signed)
Left lumbar radiculopathy.  Discussed with patient about icing regimen and home exercise, which activities to do which wants to avoid.  Patient is to increase activity slowly

## 2019-12-24 ENCOUNTER — Telehealth: Payer: Self-pay | Admitting: Family Medicine

## 2019-12-24 ENCOUNTER — Other Ambulatory Visit: Payer: Self-pay

## 2019-12-24 NOTE — Telephone Encounter (Signed)
Pt spoke with Dr. Katrinka Blazing earlier this week. She wanted [place] to know she is in agreement to move forward with the urgent epidural.  Can we advise her when order is in so she can contact [facility] for urgent scheduling.

## 2019-12-24 NOTE — Telephone Encounter (Signed)
Left message for patient regarding epidural.

## 2019-12-25 ENCOUNTER — Other Ambulatory Visit: Payer: Self-pay

## 2019-12-25 DIAGNOSIS — M5416 Radiculopathy, lumbar region: Secondary | ICD-10-CM

## 2019-12-26 ENCOUNTER — Telehealth: Payer: Self-pay | Admitting: Family Medicine

## 2019-12-26 NOTE — Telephone Encounter (Signed)
FYI only. Pt scheduled epidural for 7/23 and will follow up with [place] 8/20.

## 2019-12-29 ENCOUNTER — Other Ambulatory Visit: Payer: Self-pay | Admitting: Family Medicine

## 2019-12-31 ENCOUNTER — Telehealth: Payer: Self-pay | Admitting: Family Medicine

## 2019-12-31 NOTE — Telephone Encounter (Signed)
Pt getting epidural this Friday, 7/23. Requesting valium called in to [facility] on [facility] so she can pick it up Thursday, 7/22.

## 2020-01-01 MED ORDER — DIAZEPAM 5 MG PO TABS: ORAL_TABLET | ORAL | 0 refills | Status: DC

## 2020-01-01 NOTE — Telephone Encounter (Signed)
Pt.notified

## 2020-01-02 ENCOUNTER — Ambulatory Visit
Admission: RE | Admit: 2020-01-02 | Discharge: 2020-01-02 | Disposition: A | Discharge status: Home / Self Care | Payer: 59 | Source: Ambulatory Visit | Attending: Family Medicine | Admitting: Family Medicine

## 2020-01-02 ENCOUNTER — Other Ambulatory Visit: Payer: Self-pay

## 2020-01-02 DIAGNOSIS — M5416 Radiculopathy, lumbar region: Secondary | ICD-10-CM

## 2020-01-02 MED ORDER — IOPAMIDOL (ISOVUE-M 200) INJECTION 41%
1.0000 mL | Freq: Once | INTRAMUSCULAR | Status: AC
  Administered 2020-01-02: 1 mL via EPIDURAL

## 2020-01-02 MED ORDER — METHYLPREDNISOLONE ACETATE 40 MG/ML INJ SUSP (RADIOLOG
120.0000 mg | Freq: Once | INTRAMUSCULAR | Status: AC
  Administered 2020-01-02: 120 mg via EPIDURAL

## 2020-01-02 NOTE — Discharge Instructions (Signed)

## 2020-01-03 ENCOUNTER — Other Ambulatory Visit: Payer: Self-pay | Admitting: Family Medicine

## 2020-01-30 ENCOUNTER — Encounter: Payer: Self-pay | Admitting: Family Medicine

## 2020-01-30 ENCOUNTER — Other Ambulatory Visit: Payer: Self-pay

## 2020-01-30 ENCOUNTER — Ambulatory Visit (INDEPENDENT_AMBULATORY_CARE_PROVIDER_SITE_OTHER): Payer: 59 | Admitting: Family Medicine

## 2020-01-30 ENCOUNTER — Other Ambulatory Visit: Payer: Self-pay | Admitting: Family Medicine

## 2020-01-30 DIAGNOSIS — M5416 Radiculopathy, lumbar region: Secondary | ICD-10-CM

## 2020-01-30 NOTE — Progress Notes (Signed)
[name] [address] [place] [address] Phone: [phone] Subjective:   I Joanna [name] am serving as a Neurosurgeon for Dr. Antoine Primas.  This visit occurred during the SARS-CoV-2 public health emergency.  Safety protocols were in place, including screening questions prior to the visit, additional usage of staff PPE, and extensive cleaning of exam room while observing appropriate contact time as indicated for disinfecting solutions.   I'm seeing this patient by the request  of:  Carney Living, MD  CC: Low back pain  ASN:[id number]   12/22/2019 Left-sided lumbar radiculopathy that is consistent with the L5-S1 central and left lateral recess disc protrusion causing nerve impingement.  Patient encouraged to consider the possibility of epidural.  Patient has been given information of this as well as the potential for the referral for surgery but I think that that would be significantly more invasive than likely not necessary at the moment.  Patient will increase activity slowly and will ask questions then follow-up with me again if she would like to do the epidural or the referral.  Patient is in agreement with the plan.   Update 01/30/2020 Joanna [name] is a 44 y.o. female coming in with complaint of low back pain. Epidural 01/02/2020. Patient states that she is a lot less pain. Still feeling a "line of pain" every so often that she believes comes from doing/ lifting too much.  Patient did have the L5-S1 central disc protrusion and did have the epidural done.  Patient would state 80 to 85% better at the moment.  Only mild discomfort from time to time.      Past Medical History:  Diagnosis Date  . Anemia    during the time she had fibroids  . Anxiety   . Arthritis   . Depression   . Heart murmur    born with a heart murmur, but cleared up years ago  . Migraine    occasional  . PONV (postoperative nausea and vomiting)    some dry heaving  on waking up   Past Surgical History:  Procedure Laterality Date  . ABDOMINAL HYSTERECTOMY    . BREAST SURGERY    . CESAREAN SECTION    . RADIOLOGY WITH ANESTHESIA N/A 12/04/2019   Procedure: MRI PELVIS WITH AND WITHOUT CONTRAST,LUMBER SPINE WITHOUT CONTRAST;  Surgeon: Radiologist, Medication, MD;  Location: [facility] OR;  Service: Radiology;  Laterality: N/A;  . REDUCTION MAMMAPLASTY Bilateral 2001   Social History   Socioeconomic History  . Marital status: Legally Separated    Spouse name: Not on file  . Number of children: Not on file  . Years of education: Not on file  . Highest education level: Not on file  Occupational History  . Not on file  Tobacco Use  . Smoking status: Former Games developer  . Smokeless tobacco: Never Used  . Tobacco comment: as of 12/02/19 has been 15 years  Vaping Use  . Vaping Use: Never used  Substance and Sexual Activity  . Alcohol use: No  . Drug use: No  . Sexual activity: Yes    Birth control/protection: Surgical  Other Topics Concern  . Not on file  Social History Narrative   Works at [employer]    Daughter - 2008.   Goes to [employer] school   Social Determinants of Health   Financial Resource Strain:   . Difficulty of Paying Living Expenses: Not on file  Food Insecurity:   .  Worried About Programme researcher, broadcasting/film/video in the Last Year: Not on file  . Ran Out of Food in the Last Year: Not on file  Transportation Needs:   . Lack of Transportation (Medical): Not on file  . Lack of Transportation (Non-Medical): Not on file  Physical Activity:   . Days of Exercise per Week: Not on file  . Minutes of Exercise per Session: Not on file  Stress:   . Feeling of Stress : Not on file  Social Connections:   . Frequency of Communication with Friends and Family: Not on file  . Frequency of Social Gatherings with Friends and Family: Not on file  . Attends Religious Services: Not on file  . Active Member of Clubs or Organizations:  Not on file  . Attends Banker Meetings: Not on file  . Marital Status: Not on file   No Known Allergies No family history on file.     Current Outpatient Medications (Analgesics):  Marland Kitchen  DUEXIS 800-26.6 MG TABS, TAKE ONE TABLET BY MOUTH THREE TIMES A DAY AS NEEDED   Current Outpatient Medications (Other):  .  diazepam (VALIUM) 5 MG tablet, One tab by mouth, 2 hours before procedure. .  gabapentin (NEURONTIN) 300 MG capsule, Take 1 capsule (300 mg total) by mouth at bedtime. (Patient taking differently: Take 300-900 mg by mouth See admin instructions. 900 mg every morning, then 1-2 caps at bedtime as needed for pain) .  tiZANidine (ZANAFLEX) 4 MG tablet, TAKE 1 TABLET (4 MG TOTAL) BY MOUTH NIGHTLY FOR 10 DAYS, AS DIRECTED BY MD .  Vilazodone HCl (VIIBRYD) 10 MG TABS, Take 1 tablet (10 mg total) by mouth daily. Take 10mg  daily for 2 weeks and then 20 mg daily there after. .  Vitamin D, Ergocalciferol, (DRISDOL) 1.25 MG (50000 UNIT) CAPS capsule, TAKE 1 CAPSULE (50,000 UNITS TOTAL) BY MOUTH EVERY 7 (SEVEN) DAYS.   Reviewed prior external information including notes and imaging from  primary care provider As well as notes that were available from care everywhere and other healthcare systems.  Past medical history, social, surgical and family history all reviewed in electronic medical record.  No pertanent information unless stated regarding to the chief complaint.   Review of Systems:  No headache, visual changes, nausea, vomiting, diarrhea, constipation, dizziness, abdominal pain, skin rash, fevers, chills, night sweats, weight loss, swollen lymph nodes, body aches, joint swelling, chest pain, shortness of breath, mood changes. POSITIVE muscle aches  Objective  Blood pressure (!) 140/94, pulse 78, height 5\' 1"  (1.549 m), weight 185 lb (83.9 kg), SpO2 98 %.   General: No apparent distress alert and oriented x3 mood and affect normal, dressed appropriately.  HEENT: Pupils  equal, extraocular movements intact  Respiratory: Patient's speak in full sentences and does not appear short of breath  Cardiovascular: No lower extremity edema, non tender, no erythema  Neuro: Cranial nerves II through XII are intact, neurovascularly intact in all extremities with 2+ DTRs and 2+ pulses.  Gait normal with good balance and coordination.  MSK:  Non tender with full range of motion and good stability and symmetric strength and tone of shoulders, elbows, wrist, hip, knee and ankles bilaterally.  Peroneal cyst noted on the left ankle Back exam still shows some mild positive FABER test.  Negative straight leg test which is an improvement but tightness of the hamstring noted.   Impression and Recommendations:     The above documentation has been reviewed and is accurate and complete  Lyndal Pulley, DO       Note: This dictation was prepared with Dragon dictation along with smaller phrase technology. Any transcriptional errors that result from this process are unintentional.

## 2020-01-30 NOTE — Patient Instructions (Addendum)
Good to see you You know where I am if you need me for anything See me when you need me

## 2020-01-30 NOTE — Assessment & Plan Note (Signed)
Responded well to epidural in January 02, 2020.  Discussed the possible need for repeating occasionally.  Continue the gabapentin if it is helpful, Zanaflex for breakthrough, icing regimen.  Patient is feeling that the anti-inflammatories has been helpful as well.  Follow-up with me again in  8 weeks

## 2020-02-09 ENCOUNTER — Telehealth: Payer: Self-pay | Admitting: Family Medicine

## 2020-02-09 NOTE — Telephone Encounter (Signed)
[facility] called requesting clinical notes for the Duexis prescription that was sent in. She said her insurance will not cover even the generic brand so they are trying to complete a PA.  Please advise.  Phone: [phone] Fax: [phone]

## 2020-02-10 ENCOUNTER — Telehealth: Payer: Self-pay | Admitting: Family Medicine

## 2020-02-10 NOTE — Telephone Encounter (Signed)
PA approved for medication will fax approval to pharmacy

## 2020-02-10 NOTE — Telephone Encounter (Signed)
Third party pharmacy called, PA needed for patient ibuprofen. Key in Cover My Meds: [id number]

## 2020-02-10 NOTE — Telephone Encounter (Signed)
Sent Chart Note for Duexis script.

## 2020-02-10 NOTE — Telephone Encounter (Signed)
Have sent the PA through cover my meds awaiting a reply from insurance on whether approved or denied. Medication is Duexis

## 2020-02-12 ENCOUNTER — Telehealth: Payer: Self-pay | Admitting: Family Medicine

## 2020-02-12 MED ORDER — NAPROXEN 500 MG PO TABS
500.0000 mg | ORAL_TABLET | Freq: Two times a day (BID) | ORAL | 3 refills | Status: DC
Start: 2020-02-12 — End: 2020-04-22

## 2020-02-12 NOTE — Telephone Encounter (Signed)
Left message for patient regard prescription that Dr. Katrinka Blazing called in to pharmacy.

## 2020-02-12 NOTE — Telephone Encounter (Signed)
Patient called stating that her insurance is not going to cover Duexus. She has been taking Ibuprofen but thinks she is taking too much because her stomach feels like it is burning. Is there anything else she can try?

## 2020-02-13 ENCOUNTER — Other Ambulatory Visit: Payer: Self-pay | Admitting: Family Medicine

## 2020-02-26 ENCOUNTER — Other Ambulatory Visit: Payer: Self-pay

## 2020-02-26 ENCOUNTER — Telehealth: Payer: Self-pay | Admitting: Family Medicine

## 2020-02-26 DIAGNOSIS — M5416 Radiculopathy, lumbar region: Secondary | ICD-10-CM

## 2020-02-26 NOTE — Telephone Encounter (Signed)
Yes same injection please

## 2020-02-26 NOTE — Telephone Encounter (Signed)
Pt states she was advised to call us when her back pain returned. It has returned and she is ready for her 2nd epidural to be scheduled.  Work # [phone]

## 2020-02-26 NOTE — Telephone Encounter (Signed)
Epidural ordered and patient notified.  

## 2020-03-02 ENCOUNTER — Other Ambulatory Visit: Payer: Self-pay | Admitting: Family Medicine

## 2020-03-12 ENCOUNTER — Telehealth: Payer: Self-pay | Admitting: Family Medicine

## 2020-03-12 NOTE — Telephone Encounter (Signed)
Left message for patient regarding recommendation

## 2020-03-12 NOTE — Telephone Encounter (Signed)
Patient called asking for someone to call her back to discuss something.  (she would not explain to me.

## 2020-03-12 NOTE — Telephone Encounter (Signed)
Difficult to say but if less kids and older then likely would help the back

## 2020-03-26 ENCOUNTER — Other Ambulatory Visit: Payer: Self-pay | Admitting: Family Medicine

## 2020-03-26 ENCOUNTER — Ambulatory Visit
Admission: RE | Admit: 2020-03-26 | Discharge: 2020-03-26 | Disposition: A | Discharge status: Home / Self Care | Payer: Managed Care, Other (non HMO) | Source: Ambulatory Visit | Attending: Family Medicine | Admitting: Family Medicine

## 2020-03-26 ENCOUNTER — Other Ambulatory Visit: Payer: Self-pay

## 2020-03-26 DIAGNOSIS — M5416 Radiculopathy, lumbar region: Secondary | ICD-10-CM

## 2020-03-26 DIAGNOSIS — G609 Hereditary and idiopathic neuropathy, unspecified: Secondary | ICD-10-CM

## 2020-03-26 MED ORDER — IOPAMIDOL (ISOVUE-M 200) INJECTION 41%
1.0000 mL | Freq: Once | INTRAMUSCULAR | Status: AC
  Administered 2020-03-26: 1 mL via EPIDURAL

## 2020-03-26 MED ORDER — METHYLPREDNISOLONE ACETATE 40 MG/ML INJ SUSP (RADIOLOG
120.0000 mg | Freq: Once | INTRAMUSCULAR | Status: AC
  Administered 2020-03-26: 120 mg via EPIDURAL

## 2020-03-26 NOTE — Discharge Instructions (Signed)

## 2020-03-30 ENCOUNTER — Other Ambulatory Visit: Payer: Self-pay | Admitting: Family Medicine

## 2020-03-30 DIAGNOSIS — G609 Hereditary and idiopathic neuropathy, unspecified: Secondary | ICD-10-CM

## 2020-03-30 NOTE — Telephone Encounter (Signed)
Patient calls nurse line stating Gabapentin was never sent to the pharmacy on 10/15. Due to the system wide outage and confirmation from pharmacy that med was never received. Will send in medication.

## 2020-04-21 ENCOUNTER — Other Ambulatory Visit: Payer: Self-pay | Admitting: Family Medicine

## 2020-04-21 NOTE — Progress Notes (Signed)
[name] [address] [place] [address] Phone: [phone] Subjective:   Joanna [name], am serving as a scribe for Dr. Antoine Primas. This visit occurred during the SARS-CoV-2 public health emergency.  Safety protocols were in place, including screening questions prior to the visit, additional usage of staff PPE, and extensive cleaning of exam room while observing appropriate contact time as indicated for disinfecting solutions.   I'm seeing this patient by the request  of:  Carney Living, MD  CC: Low back pain follow-up  DTO:[id number]   01/30/2020 Responded well to epidural in January 02, 2020.  Discussed the possible need for repeating occasionally.  Continue the gabapentin if it is helpful, Zanaflex for breakthrough, icing regimen.  Patient is feeling that the anti-inflammatories has been helpful as well.  Follow-up with me again in  8 weeks  Update 04/22/2020 Joanna [name] is a 44 y.o. female coming in with complaint of low back pain. Patient had epidural on 03/26/2020. Patient states that she is feeling a lot better and is walking a lot better. Patient states 90% better at this time.  Still taking the gabapentin but otherwise not taking any regular medications.  Feels like she is doing very well.  Also is going to be transferring jobs and think she will be doing a little less work.      Past Medical History:  Diagnosis Date  . Anemia    during the time she had fibroids  . Anxiety   . Arthritis   . Depression   . Heart murmur    born with a heart murmur, but cleared up years ago  . Migraine    occasional  . PONV (postoperative nausea and vomiting)    some dry heaving on waking up   Past Surgical History:  Procedure Laterality Date  . ABDOMINAL HYSTERECTOMY    . BREAST SURGERY    . CESAREAN SECTION    . RADIOLOGY WITH ANESTHESIA N/A 12/04/2019   Procedure: MRI PELVIS WITH AND WITHOUT CONTRAST,LUMBER SPINE WITHOUT  CONTRAST;  Surgeon: Radiologist, Medication, MD;  Location: [facility] OR;  Service: Radiology;  Laterality: N/A;  . REDUCTION MAMMAPLASTY Bilateral 2001   Social History   Socioeconomic History  . Marital status: Legally Separated    Spouse name: Not on file  . Number of children: Not on file  . Years of education: Not on file  . Highest education level: Not on file  Occupational History  . Not on file  Tobacco Use  . Smoking status: Former Games developer  . Smokeless tobacco: Never Used  . Tobacco comment: as of 12/02/19 has been 15 years  Vaping Use  . Vaping Use: Never used  Substance and Sexual Activity  . Alcohol use: No  . Drug use: No  . Sexual activity: Yes    Birth control/protection: Surgical  Other Topics Concern  . Not on file  Social History Narrative   Works at [employer]    Daughter - 2008.   Goes to [employer] school   Social Determinants of Health   Financial Resource Strain:   . Difficulty of Paying Living Expenses: Not on file  Food Insecurity:   . Worried About Programme researcher, broadcasting/film/video in the Last Year: Not on file  . Ran Out of Food in the Last Year: Not on file  Transportation Needs:   . Lack of Transportation (Medical): Not on file  . Lack of Transportation (  Non-Medical): Not on file  Physical Activity:   . Days of Exercise per Week: Not on file  . Minutes of Exercise per Session: Not on file  Stress:   . Feeling of Stress : Not on file  Social Connections:   . Frequency of Communication with Friends and Family: Not on file  . Frequency of Social Gatherings with Friends and Family: Not on file  . Attends Religious Services: Not on file  . Active Member of Clubs or Organizations: Not on file  . Attends Banker Meetings: Not on file  . Marital Status: Not on file   No Known Allergies History reviewed. No pertinent family history.     Current Outpatient Medications (Analgesics):  Marland Kitchen  Ibuprofen-Famotidine 800-26.6  MG TABS, TAKE ONE TABLET BY MOUTH THREE TIMES A DAY AS NEEDED   Current Outpatient Medications (Other):  .  diazepam (VALIUM) 5 MG tablet, One tab by mouth, 2 hours before procedure. .  gabapentin (NEURONTIN) 300 MG capsule, TAKE 1-2 CAPSULES 3 TIMES A DAY .  tiZANidine (ZANAFLEX) 4 MG tablet, TAKE 1 TABLET (4 MG TOTAL) BY MOUTH NIGHTLY FOR 10 DAYS, AS DIRECTED BY MD .  Vilazodone HCl (VIIBRYD) 10 MG TABS, Take 1 tablet (10 mg total) by mouth daily. Take 10mg  daily for 2 weeks and then 20 mg daily there after. .  Vitamin D, Ergocalciferol, (DRISDOL) 1.25 MG (50000 UNIT) CAPS capsule, TAKE 1 CAPSULE (50,000 UNITS TOTAL) BY MOUTH EVERY 7 (SEVEN) DAYS.   Reviewed prior external information including notes and imaging from  primary care provider As well as notes that were available from care everywhere and other healthcare systems.  Past medical history, social, surgical and family history all reviewed in electronic medical record.  No pertanent information unless stated regarding to the chief complaint.   Review of Systems:  No headache, visual changes, nausea, vomiting, diarrhea, constipation, dizziness, abdominal pain, skin rash, fevers, chills, night sweats, weight loss, swollen lymph nodes, body aches, joint swelling, chest pain, shortness of breath, mood changes. POSITIVE muscle aches  Objective  Blood pressure 130/90, pulse 96, height 5\' 1"  (1.549 m), weight 182 lb (82.6 kg), SpO2 99 %.   General: No apparent distress alert and oriented x3 mood and affect normal, dressed appropriately.  HEENT: Pupils equal, extraocular movements intact  Respiratory: Patient's speak in full sentences and does not appear short of breath  Cardiovascular: No lower extremity edema, non tender, no erythema  Neuro: Cranial nerves II through XII are intact, neurovascularly intact in all extremities with 2+ DTRs and 2+ pulses.  Gait normal with good balance and coordination.  MSK:  Non tender with full range  of motion and good stability and symmetric strength and tone of shoulders, elbows, wrist, hip, knee and ankles bilaterally.  Low back exam still shows some tightness on the left side of the lower back.  Patient still at 90 degrees of forward flexion has some mild radicular symptoms down the left leg.  Patient has 5 out of 5 strength in lower extremity mild pain in the gluteal area still noted.   Impression and Recommendations:     The above documentation has been reviewed and is accurate and complete , DO

## 2020-04-22 ENCOUNTER — Other Ambulatory Visit: Payer: Self-pay

## 2020-04-22 ENCOUNTER — Encounter: Payer: Self-pay | Admitting: Family Medicine

## 2020-04-22 ENCOUNTER — Ambulatory Visit (INDEPENDENT_AMBULATORY_CARE_PROVIDER_SITE_OTHER): Payer: 59 | Admitting: Family Medicine

## 2020-04-22 DIAGNOSIS — M5416 Radiculopathy, lumbar region: Secondary | ICD-10-CM | POA: Diagnosis not present

## 2020-04-22 NOTE — Patient Instructions (Addendum)
Good to see you  Continue exercising Continue medicine if it helps so glad you are doing better  See me again when you need me

## 2020-04-22 NOTE — Assessment & Plan Note (Signed)
Significant improvement. Discussed HEP continue the gabapentin or duxies stay active rtc PRN

## 2020-06-19 ENCOUNTER — Ambulatory Visit: Payer: 59

## 2020-06-23 ENCOUNTER — Other Ambulatory Visit: Payer: Self-pay | Admitting: Family Medicine

## 2020-08-26 ENCOUNTER — Other Ambulatory Visit: Payer: Self-pay | Admitting: Family Medicine

## 2020-09-19 ENCOUNTER — Other Ambulatory Visit: Payer: Self-pay | Admitting: Family Medicine

## 2020-09-19 DIAGNOSIS — G609 Hereditary and idiopathic neuropathy, unspecified: Secondary | ICD-10-CM

## 2020-11-23 ENCOUNTER — Other Ambulatory Visit: Payer: Self-pay

## 2020-11-23 ENCOUNTER — Ambulatory Visit (INDEPENDENT_AMBULATORY_CARE_PROVIDER_SITE_OTHER): Payer: 59 | Admitting: Family Medicine

## 2020-11-23 ENCOUNTER — Encounter: Payer: Self-pay | Admitting: Family Medicine

## 2020-11-23 VITALS — BP 144/82 | HR 81 | Wt 196.6 lb

## 2020-11-23 DIAGNOSIS — M797 Fibromyalgia: Secondary | ICD-10-CM | POA: Diagnosis not present

## 2020-11-23 DIAGNOSIS — G8929 Other chronic pain: Secondary | ICD-10-CM

## 2020-11-23 DIAGNOSIS — Z1211 Encounter for screening for malignant neoplasm of colon: Secondary | ICD-10-CM

## 2020-11-23 DIAGNOSIS — G609 Hereditary and idiopathic neuropathy, unspecified: Secondary | ICD-10-CM

## 2020-11-23 DIAGNOSIS — M545 Low back pain, unspecified: Secondary | ICD-10-CM | POA: Diagnosis not present

## 2020-11-23 MED ORDER — GABAPENTIN 300 MG PO CAPS: ORAL_CAPSULE | ORAL | 2 refills | Status: DC

## 2020-11-23 NOTE — Patient Instructions (Signed)
Good to see you today - Thank you for coming in  Things we discussed today:  If happens again - consider chiropracter or Dr Katrinka Blazing Continue to use heat and ibuprofen If any weakness or fever or rash or shortness of breath let me know  I will refill your gabapentin.  Take as needed for pain  You should exercise at least 20 minutes every day.    Choose something you like the most or hate the least.   Having a set time every day and having a partner will help you stick to it.    If you are too tired try to do at least 5 minutes, it often gets easier.   You need a colonoscopy to prevent colon cancer.  I have placed a referral to the gastroenterologist's office.  They should call you within two weeks.  If they do not please let me know   Please always bring your medication bottles  Come back to see me in 6 months

## 2020-11-23 NOTE — Progress Notes (Signed)
    SUBJECTIVE:   CHIEF COMPLAINT / HPI:   Back Pain Episodes of L or R sided upper back tightness "crook" type sensation.  Last 2-3 weeks.  Better with heat or ibuprofen and gabapentin.  No weakness or rash or pain with range of motion of shoulders or incontinence or shortness of breath  Fibromyalgia Takes as needed gabapentin from 2-5 per day.  Uses zanaflex at night.  Trying to stay active.  No swollen red joints   PERTINENT  PMH / PSH: Mood has been stable  OBJECTIVE:   BP (!) 144/82   Pulse 81   Wt 196 lb 9.6 oz (89.2 kg)   SpO2 97%   BMI 37.15 kg/m     Back - Normal skin, Spine with normal alignment and no deformity.  No tenderness to vertebral process palpation.  Paraspinous muscles are not tender and without spasm.   Range of motion is full at neck and lumbar sacral regions Shoulders - FROM no pain Distal strength and sensation normal in hands Neck:  No deformities, thyromegaly, masses, or tenderness noted.   Supple with full range of motion without pain.   ASSESSMENT/PLAN:   Fibromyalgia Under control.  Recommend using gabapentin only as really needed and to try for more regular exercise  Bilateral low back pain Recent flare seems most consistent with muscle spasm.  No abnormality on exam today. See after visit summary for recommendations     Carney Living, MD [facility]

## 2020-11-23 NOTE — Assessment & Plan Note (Signed)
Under control.  Recommend using gabapentin only as really needed and to try for more regular exercise

## 2020-11-23 NOTE — Assessment & Plan Note (Signed)
Recent flare seems most consistent with muscle spasm.  No abnormality on exam today. See after visit summary for recommendations

## 2020-11-24 ENCOUNTER — Telehealth: Payer: Self-pay

## 2020-11-24 DIAGNOSIS — G609 Hereditary and idiopathic neuropathy, unspecified: Secondary | ICD-10-CM

## 2020-11-24 MED ORDER — GABAPENTIN 300 MG PO CAPS: 600.0000 mg | ORAL_CAPSULE | Freq: Three times a day (TID) | ORAL | 2 refills | Status: DC | PRN

## 2020-11-24 NOTE — Telephone Encounter (Signed)
Patient calls nurse line regarding gabapentin rx. Per pharmacy, rx needs to be updated to 2 tablets up to three times daily, as insurance will not cover due to current directions.   Please advise if rx can be changed.   Veronda Prude, RN

## 2020-11-24 NOTE — Telephone Encounter (Signed)
New Rx sent.  Thanks.

## 2020-12-27 ENCOUNTER — Other Ambulatory Visit: Payer: Self-pay | Admitting: Family Medicine

## 2020-12-28 NOTE — Telephone Encounter (Signed)
Dr. Katrinka Blazing would like for patient to have Vit D lab before giving rx.

## 2020-12-28 NOTE — Telephone Encounter (Signed)
Left patient message to see if she was still in need of Vit D rx.

## 2021-01-20 ENCOUNTER — Encounter (HOSPITAL_COMMUNITY): Payer: Self-pay

## 2021-01-20 ENCOUNTER — Other Ambulatory Visit: Payer: Self-pay

## 2021-01-20 ENCOUNTER — Emergency Department (HOSPITAL_COMMUNITY)
Admission: EM | Admit: 2021-01-20 | Discharge: 2021-01-20 | Disposition: A | Discharge status: Home / Self Care | Payer: 59 | Attending: Emergency Medicine | Admitting: Emergency Medicine

## 2021-01-20 ENCOUNTER — Emergency Department (HOSPITAL_COMMUNITY): Payer: 59

## 2021-01-20 DIAGNOSIS — M549 Dorsalgia, unspecified: Secondary | ICD-10-CM | POA: Insufficient documentation

## 2021-01-20 DIAGNOSIS — Z87891 Personal history of nicotine dependence: Secondary | ICD-10-CM | POA: Insufficient documentation

## 2021-01-20 DIAGNOSIS — M79602 Pain in left arm: Secondary | ICD-10-CM

## 2021-01-20 DIAGNOSIS — M542 Cervicalgia: Secondary | ICD-10-CM | POA: Insufficient documentation

## 2021-01-20 MED ORDER — PREDNISONE 20 MG PO TABS: 40.0000 mg | ORAL_TABLET | Freq: Every day | ORAL | 0 refills | Status: AC

## 2021-01-20 MED ORDER — LIDOCAINE 5 % EX PTCH
1.0000 | MEDICATED_PATCH | CUTANEOUS | Status: DC
  Administered 2021-01-20: 1 via TRANSDERMAL
  Filled 2021-01-20: qty 1

## 2021-01-20 MED ORDER — NAPROXEN 250 MG PO TABS
500.0000 mg | ORAL_TABLET | Freq: Once | ORAL | Status: AC
  Administered 2021-01-20: 500 mg via ORAL
  Filled 2021-01-20: qty 2

## 2021-01-20 NOTE — Discharge Instructions (Addendum)
Please call Dr. Katrinka Blazing for an appointment.  I have also given you the information for neurosurgery in case you need it. As we discussed please take the naproxen twice a day.  Do not take any other NSAIDs such as Advil, ibuprofen or Motrin at the same time.  The lidocaine patches are available over-the-counter.  Your CT scan does show that you have some narrowing of the canals that may be pressing on the nerves, however I suspect that you also have a component of muscle spasms.  If your symptoms worsen, you develop chest pain, or have any new complaints or concerns please seek additional medical care and evaluation.  I have given you a prescription for steroids today.  Some common side effects include feelings of extra energy, feeling warm, increased appetite, and stomach upset.  If you are diabetic your sugars may run higher than usual.

## 2021-01-20 NOTE — ED Notes (Signed)
Alert, NAD, calm, interactive, resps e/u, c/o L neck pain, radiating to L trapezius down to L fingers. Reports fingers feel numb. CMS intact.

## 2021-01-20 NOTE — ED Provider Notes (Signed)
[facility] EMERGENCY DEPARTMENT Provider Note   CSN: [id number] Arrival date & time: 01/20/21  5852     History Chief Complaint  Patient presents with   Neck Pain   Back Pain   Arm Pain    Joanna [name] is a 45 y.o. female with a past medical history of anemia, hereditary and idiopathic peripheral neuropathy, fibromyalgia, who presents today for evaluation of left arm pain.  She reports that about a week ago she woke up and felt like she had slept funny and had soreness on her left posterior shoulder.  She states that this is progressed and she now has pain shooting down her left arm primarily into her left index finger.  She denies any chest pain or shortness of breath.  No arm swelling.  No history of DVT.  She denies any recent surgeries or immobilizations.  She denies any color changes rashes or wounds. No fever or history of IV drug use.  She has previously seen sports medicine for lumbar radiculopathy.  She states that in the past she has had steroid burst that have worked well for her.  HPI     Past Medical History:  Diagnosis Date   Anemia    during the time she had fibroids   Anxiety    Arthritis    Depression    Heart murmur    born with a heart murmur, but cleared up years ago   Migraine    occasional   PONV (postoperative nausea and vomiting)    some dry heaving on waking up    Patient Active Problem List   Diagnosis Date Noted   Nonallopathic lesion of sacral region 08/15/2019   Nonallopathic lesion of lumbar region 08/15/2019   Nonallopathic lesion of thoracic region 08/15/2019   Left lumbar radiculopathy 07/21/2019   Hereditary and idiopathic peripheral neuropathy 05/13/2019   Partial nontraumatic rupture of right rotator cuff 07/24/2018   Fracture of base of fifth metatarsal bone of left foot at metaphyseal-diaphyseal junction, closed, initial encounter 01/22/2018   Peroneal tendinitis of left lower extremity 12/21/2017    Fibromyalgia 08/23/2016   Bilateral low back pain 11/21/2012   Mood changes 05/11/2010   MENOPAUSE, SURGICAL 05/11/2010   SICKLE CELL TRAIT 08/09/2006   MIGRAINE, UNSPEC., W/O INTRACTABLE MIGRAINE 08/09/2006    Past Surgical History:  Procedure Laterality Date   ABDOMINAL HYSTERECTOMY     BREAST SURGERY     CESAREAN SECTION     RADIOLOGY WITH ANESTHESIA N/A 12/04/2019   Procedure: MRI PELVIS WITH AND WITHOUT CONTRAST,LUMBER SPINE WITHOUT CONTRAST;  Surgeon: Radiologist, Medication, MD;  Location: [facility] OR;  Service: Radiology;  Laterality: N/A;   REDUCTION MAMMAPLASTY Bilateral 2001     OB History   No obstetric history on file.     History reviewed. No pertinent family history.  Social History   Tobacco Use   Smoking status: Former   Smokeless tobacco: Never   Tobacco comments:    as of 12/02/19 has been 15 years  Vaping Use   Vaping Use: Never used  Substance Use Topics   Alcohol use: No   Drug use: No    Home Medications Prior to Admission medications   Medication Sig Start Date End Date Taking? Authorizing Provider  predniSONE (DELTASONE) 20 MG tablet Take 2 tablets (40 mg total) by mouth daily for 5 days. 01/20/21 01/25/21 Yes Cristina Gong, PA-C  gabapentin (NEURONTIN) 300 MG capsule Take 2 capsules (600 mg total) by mouth  3 (three) times daily as needed. 11/24/20   Carney Living, MD  Ibuprofen-Famotidine 800-26.6 MG TABS TAKE ONE TABLET BY MOUTH THREE TIMES A DAY AS NEEDED 06/24/20   Judi Saa, DO  tiZANidine (ZANAFLEX) 4 MG tablet TAKE 1 TABLET (4 MG TOTAL) BY MOUTH NIGHTLY FOR 10 DAYS, AS DIRECTED BY MD 08/26/20   Judi Saa, DO  Vilazodone HCl (VIIBRYD) 10 MG TABS Take 1 tablet (10 mg total) by mouth daily. Take 10mg  daily for 2 weeks and then 20 mg daily there after. Patient not taking: Reported on 11/23/2020 11/11/19   01/11/20, DO  Vitamin D, Ergocalciferol, (DRISDOL) 1.25 MG (50000 UNIT) CAPS capsule TAKE 1 CAPSULE (50,000 UNITS  TOTAL) BY MOUTH EVERY 7 (SEVEN) DAYS. Patient not taking: Reported on 11/23/2020 12/29/19   12/31/19, DO    Allergies    Patient has no known allergies.  Review of Systems   Review of Systems  Constitutional:  Negative for chills and fever.  Respiratory:  Negative for cough, chest tightness and shortness of breath.   Cardiovascular:  Negative for chest pain.  Gastrointestinal:  Negative for abdominal pain.  Musculoskeletal:  Positive for neck pain. Negative for back pain and neck stiffness.  Skin:  Negative for color change, rash and wound.  Neurological:  Positive for numbness. Negative for weakness and headaches.  Psychiatric/Behavioral:  Negative for confusion.   All other systems reviewed and are negative.  Physical Exam Updated Vital Signs BP 130/79 (BP Location: Right Arm)   Pulse 70   Temp 98 F (36.7 C)   Resp 20   Ht 5\' 1"  (1.549 m)   Wt 89 kg   SpO2 100%   BMI 37.07 kg/m   Physical Exam Vitals and nursing note reviewed.  Constitutional:      General: She is not in acute distress.    Appearance: She is not ill-appearing or diaphoretic.  HENT:     Head: Normocephalic and atraumatic.  Eyes:     General: No scleral icterus.       Right eye: No discharge.        Left eye: No discharge.     Conjunctiva/sclera: Conjunctivae normal.  Cardiovascular:     Rate and Rhythm: Normal rate and regular rhythm.     Pulses: Normal pulses.     Heart sounds: Normal heart sounds.     Comments: 2+ bilateral radial pulses. Pulmonary:     Effort: Pulmonary effort is normal. No respiratory distress.     Breath sounds: No stridor.  Abdominal:     General: There is no distension.     Tenderness: There is no abdominal tenderness.  Musculoskeletal:        General: Tenderness (over left trapezius muscle extending down along scapula.) present. No deformity.     Cervical back: Normal range of motion and neck supple. No tenderness (No midline TTP.).  Skin:    General: Skin is  warm and dry.  Neurological:     Mental Status: She is alert.     Motor: No abnormal muscle tone.     Comments: Sensation decreased to light touch over left index finger, to a lesser extent a so over the left thumb and long finger.  Unable to worsen this with percussion over the carpal tunnel.  5/5 strength grossly in bilateral biceps/triceps.  Grip strength.  No pronator drift.  Psychiatric:        Mood and Affect: Mood normal.  Behavior: Behavior normal.    ED Results / Procedures / Treatments   Labs (all labs ordered are listed, but only abnormal results are displayed) Labs Reviewed - No data to display  EKG None  Radiology CT Cervical Spine Wo Contrast  Result Date: 01/20/2021 CLINICAL DATA:  45 year old female with left arm numbness and tingling. Cervical radiculopathy. EXAM: CT CERVICAL SPINE WITHOUT CONTRAST TECHNIQUE: Multidetector CT imaging of the cervical spine was performed without intravenous contrast. Multiplanar CT image reconstructions were also generated. COMPARISON:  Cervical spine radiographs 08/17/2008. FINDINGS: Alignment: Chronic straightening of cervical lordosis. Cervicothoracic junction alignment is within normal limits. Bilateral posterior element alignment is within normal limits. Skull base and vertebrae: Bone mineralization is within normal limits. Visualized skull base is intact. No atlanto-occipital dissociation. No acute osseous abnormality identified. Soft tissues and spinal canal: No prevertebral fluid or swelling. No visible canal hematoma. Negative visible noncontrast neck soft tissues. Disc levels: C2-C3:  Negative. C3-C4: Mild disc space loss. Mostly foraminal disc bulging and endplate spurring. Broad-based posterior component. No convincing spinal stenosis. Moderate bilateral C4 foraminal stenosis. C4-C5: Circumferential disc bulge with endplate spurring. Broad-based posterior component with mild spinal stenosis suspected. Moderate bilateral C5  foraminal stenosis. C5-C6: Circumferential disc bulge and endplate spurring. Broad-based posterior component (series 6, image 52) with mild spinal stenosis suspected. Moderate to severe C6 neural foraminal stenosis greater on the right. C6-C7: Circumferential disc osteophyte complex. Broad-based posterior component. No definite spinal stenosis. Severe left and moderate to severe right C7 foraminal stenosis. C7-T1: Rightward circumferential disc osteophyte complex. No definite spinal stenosis. Moderate right C8 foraminal stenosis suspected. Upper chest: Negative visible noncontrast thoracic inlet. Lung apices are clear. Upper thoracic levels appear intact. Other: Negative visible noncontrast posterior fossa. Visible tympanic cavities and mastoids are clear. IMPRESSION: 1. Widespread cervical disc and endplate degeneration. Evidence of mild spinal stenosis at C4-C5 and C5-C6. Up to severe bilateral C6 and C7 neural foraminal stenosis. Moderate bilateral C4, C5, and right C8 foraminal stenosis. 2. No acute osseous abnormality in the cervical spine. Electronically Signed   By: Odessa FlemingH  Hall M.D.   On: 01/20/2021 06:08    Procedures Procedures   Medications Ordered in ED Medications  lidocaine (LIDODERM) 5 % 1 patch (1 patch Transdermal Patch Applied 01/20/21 1341)  naproxen (NAPROSYN) tablet 500 mg (500 mg Oral Given 01/20/21 1341)    ED Course  I have reviewed the triage vital signs and the nursing notes.  Pertinent labs & imaging results that were available during my care of the patient were reviewed by me and considered in my medical decision making (see chart for details).    MDM Rules/Calculators/A&P                           Patient is a 45 year old woman who presents today for evaluation of pain in her left arm.  No chest pain.  This started after she feels like she slept funny, woke up with pain in the posterior shoulder that is now down the arm.  No shortness of breath, diaphoresis, nausea or  vomiting.  She has associated paresthesia and left fingers.  Her strength is intact.  She has 2+ radial pulses.  She does not have any chest pain.  Her pain along her trapezius muscle is very reproducible and recreates and exacerbates her reported pain. ACS/dissection is considered, however given the duration, and the reproducibility of her pain along with her having 2+ pulses, in strength/motor intact in  the absence of chest pain or back pain and history of peripheral neuropathy this is not felt to be likely.    Patient has been getting naproxen at home occasionally with improvement in the symptoms. She is established with sports medicine, recommended that she contact them for schedule follow-up appointment. Additionally will be given the information for neurosurgery incase she has a hard time setting up appointment with sports medicine.    She did have a CT scan on her C-spine obtained earlier, this does show that she has cervical disc disease, with up to severe bilateral foraminal stenosis.  Does not have any significant evidence of cord impingement and aside from paresthesias it is neuro vascularly intact, exam. I suspect that she may have a combination of neuroforaminal stenosis, with superimposed muscle spasm contributing to her symptoms. She is afebrile, no history of IV drug use.  Doubt infection.  She states that in the past she has been helped by a burst.  This is given.  Return precautions were discussed with patient who states their understanding.  At the time of discharge patient denied any unaddressed complaints or concerns.  Patient is agreeable for discharge home.  Note: Portions of this report may have been transcribed using voice recognition software. Every effort was made to ensure accuracy; however, inadvertent computerized transcription errors may be present  Final Clinical Impression(s) / ED Diagnoses Final diagnoses:  Left arm pain    Rx / DC Orders ED Discharge Orders           Ordered    predniSONE (DELTASONE) 20 MG tablet  Daily        01/20/21 1414             Cristina Gong, [place] 01/20/21 1441    Pollyann Savoy, MD 01/20/21 1544

## 2021-01-20 NOTE — ED Triage Notes (Signed)
Neck pain radiating to left arm numbness/ weakness x 1 week.   Took gabapentin and naproxen with some relief.

## 2021-01-20 NOTE — ED Provider Notes (Signed)
Emergency Medicine Provider Triage Evaluation Note  Joanna [name] , a 45 y.o. female  was evaluated in triage.  Pt complains of 1 week of L posterior shoulder pain. Symptoms have been constant, worsening. Has very minimal improvement with naproxen. Chronically takes Gabapentin. States symptoms associated with numbness in her L thumb and 2nd-3rd fingers. RHD and no hx of trauma/injury/fall. Denies pain to midline of her neck. No fevers, hx of CA, IVDU.  Review of Systems  Positive: L upper back pain Negative: Extremity weakness, fever, trauma, hx of IVDU  Physical Exam  Ht 5\' 1"  (1.549 m)   Wt 89 kg   BMI 37.07 kg/m  Gen:   Awake, no distress   Resp:  Normal effort  MSK:   Moves extremities without difficulty  Other:  No reproducible TTP along course of L trapezius. Patient has equal grip strength bilaterally with 5/5 strength against resistance in all major muscle groups bilaterally. Patient moves extremities without ataxia. Patient ambulatory with steady gait.  Medical Decision Making  Medically screening exam initiated at 5:15 AM.  Appropriate orders placed.  Joanna [name] was informed that the remainder of the evaluation will be completed by another provider, this initial triage assessment does not replace that evaluation, and the importance of remaining in the ED until their evaluation is complete.  Cervical radiculopathy   Devoria Albe, PA-C 01/20/21 0518    03/22/21, MD 01/20/21 [phone]

## 2021-01-25 NOTE — Progress Notes (Signed)
[name] [address] [place] [address] Phone: [phone] Subjective:   Joanna [name], am serving as a scribe for Dr. Antoine Primas.  This visit occurred during the SARS-CoV-2 public health emergency.  Safety protocols were in place, including screening questions prior to the visit, additional usage of staff PPE, and extensive cleaning of exam room while observing appropriate contact time as indicated for disinfecting solutions.    I'm seeing this patient by the request  of:  Joanna Living, MD  CC: Neck pain and arm pain follow-up  HER:[id number]  Joanna [name] is a 45 y.o. female coming in with complaint of L upper back pain. Seen of lumbar spine pain in November 2021. Patient states that she has been having L shoulder pain and neck pain for 2 weeks. Pain is now radiating down the L arm and is having left 2nd finger numbness. Taking naproxen for pain. Used prednisone which helped decrease inflammation but not pain. Patient is wearing wrist brace per ED for her arm pain.   01/20/2021 Cervical spine IMPRESSION: 1. Widespread cervical disc and endplate degeneration. Evidence of mild spinal stenosis at C4-C5 and C5-C6. Up to severe bilateral C6 and C7 neural foraminal stenosis. Moderate bilateral C4, C5, and right C8 foraminal stenosis.   2. No acute osseous abnormality in the cervical spine.      Past Medical History:  Diagnosis Date   Anemia    during the time she had fibroids   Anxiety    Arthritis    Depression    Heart murmur    born with a heart murmur, but cleared up years ago   Migraine    occasional   PONV (postoperative nausea and vomiting)    some dry heaving on waking up   Past Surgical History:  Procedure Laterality Date   ABDOMINAL HYSTERECTOMY     BREAST SURGERY     CESAREAN SECTION     RADIOLOGY WITH ANESTHESIA N/A 12/04/2019   Procedure: MRI PELVIS WITH AND WITHOUT CONTRAST,LUMBER SPINE WITHOUT  CONTRAST;  Surgeon: Radiologist, Medication, MD;  Location: [facility] OR;  Service: Radiology;  Laterality: N/A;   REDUCTION MAMMAPLASTY Bilateral 2001   Social History   Socioeconomic History   Marital status: Divorced    Spouse name: Not on file   Number of children: Not on file   Years of education: Not on file   Highest education level: Not on file  Occupational History   Not on file  Tobacco Use   Smoking status: Former   Smokeless tobacco: Never   Tobacco comments:    as of 12/02/19 has been 15 years  Vaping Use   Vaping Use: Never used  Substance and Sexual Activity   Alcohol use: No   Drug use: No   Sexual activity: Yes    Birth control/protection: Surgical  Other Topics Concern   Not on file  Social History Narrative   Works at [employer] - [employer]    Daughter - 2008.   Goes to [employer] school   Social Determinants of Health   Financial Resource Strain: Not on file  Food Insecurity: Not on file  Transportation Needs: Not on file  Physical Activity: Not on file  Stress: Not on file  Social Connections: Not on file   No Known Allergies No family history on file.     Current Outpatient Medications (Analgesics):    Ibuprofen-Famotidine 800-26.6 MG TABS, TAKE ONE TABLET BY  MOUTH THREE TIMES A DAY AS NEEDED   Current Outpatient Medications (Other):    gabapentin (NEURONTIN) 300 MG capsule, Take 2 capsules (600 mg total) by mouth 3 (three) times daily as needed.   gabapentin (NEURONTIN) 300 MG capsule, Take 1 capsule (300 mg total) by mouth at bedtime.   tiZANidine (ZANAFLEX) 4 MG tablet, TAKE 1 TABLET (4 MG TOTAL) BY MOUTH NIGHTLY FOR 10 DAYS, AS DIRECTED BY MD   Vilazodone HCl (VIIBRYD) 10 MG TABS, Take 1 tablet (10 mg total) by mouth daily. Take 10mg  daily for 2 weeks and then 20 mg daily there after.   Vitamin D, Ergocalciferol, (DRISDOL) 1.25 MG (50000 UNIT) CAPS capsule, TAKE 1 CAPSULE (50,000 UNITS TOTAL) BY MOUTH EVERY 7 (SEVEN)  DAYS.   Reviewed prior external information including notes and imaging from  primary care provider As well as notes that were available from care everywhere and other healthcare systems.  Past medical history, social, surgical and family history all reviewed in electronic medical record.  No pertanent information unless stated regarding to the chief complaint.   Review of Systems:  No headache, visual changes, nausea, vomiting, diarrhea, constipation, dizziness, abdominal pain, skin rash, fevers, chills, night sweats, weight loss, swollen lymph nodes, body aches, joint swelling, chest pain, shortness of breath, mood changes. POSITIVE muscle aches  Objective  Blood pressure 102/72, pulse 89, height 5\' 1"  (1.549 m), weight 197 lb (89.4 kg), SpO2 98 %.   General: No apparent distress alert and oriented x3 mood and affect normal, dressed appropriately.  HEENT: Pupils equal, extraocular movements intact  Respiratory: Patient's speak in full sentences and does not appear short of breath  Cardiovascular: No lower extremity edema, non tender, no erythema  Gait normal with good balance and coordination.  MSK: Neck exam does have loss of lordosis.  Positive Spurling's noted on the left side.  Radicular symptoms in the C7 and C8 distribution.  Weakness in the C8 distribution on the left side noted.    Impression and Recommendations:     The above documentation has been reviewed and is accurate and complete , DO

## 2021-01-26 ENCOUNTER — Ambulatory Visit (INDEPENDENT_AMBULATORY_CARE_PROVIDER_SITE_OTHER): Payer: 59 | Admitting: Family Medicine

## 2021-01-26 ENCOUNTER — Other Ambulatory Visit: Payer: Self-pay

## 2021-01-26 VITALS — BP 102/72 | HR 89 | Ht 61.0 in | Wt 197.0 lb

## 2021-01-26 DIAGNOSIS — M5412 Radiculopathy, cervical region: Secondary | ICD-10-CM

## 2021-01-26 DIAGNOSIS — M2550 Pain in unspecified joint: Secondary | ICD-10-CM | POA: Diagnosis not present

## 2021-01-26 DIAGNOSIS — M5010 Cervical disc disorder with radiculopathy, unspecified cervical region: Secondary | ICD-10-CM

## 2021-01-26 LAB — CBC WITH DIFFERENTIAL/PLATELET
Basophils Absolute: 0 10*3/uL (ref 0.0–0.1)
Basophils Relative: 0.2 % (ref 0.0–3.0)
Eosinophils Absolute: 0.1 10*3/uL (ref 0.0–0.7)
Eosinophils Relative: 0.7 % (ref 0.0–5.0)
HCT: 40.3 % (ref 36.0–46.0)
Hemoglobin: 13.6 g/dL (ref 12.0–15.0)
Lymphocytes Relative: 35.5 % (ref 12.0–46.0)
Lymphs Abs: 4.4 10*3/uL — ABNORMAL HIGH (ref 0.7–4.0)
MCHC: 33.7 g/dL (ref 30.0–36.0)
MCV: 80.9 fl (ref 78.0–100.0)
Monocytes Absolute: 1 10*3/uL (ref 0.1–1.0)
Monocytes Relative: 8.2 % (ref 3.0–12.0)
Neutro Abs: 6.8 10*3/uL (ref 1.4–7.7)
Neutrophils Relative %: 55.4 % (ref 43.0–77.0)
Platelets: 374 10*3/uL (ref 150.0–400.0)
RBC: 4.98 Mil/uL (ref 3.87–5.11)
RDW: 13.8 % (ref 11.5–15.5)
WBC: 12.3 10*3/uL — ABNORMAL HIGH (ref 4.0–10.5)

## 2021-01-26 LAB — COMPREHENSIVE METABOLIC PANEL
ALT: 13 U/L (ref 0–35)
AST: 12 U/L (ref 0–37)
Albumin: 3.8 g/dL (ref 3.5–5.2)
Alkaline Phosphatase: 58 U/L (ref 39–117)
BUN: 16 mg/dL (ref 6–23)
CO2: 31 mEq/L (ref 19–32)
Calcium: 9.1 mg/dL (ref 8.4–10.5)
Chloride: 102 mEq/L (ref 96–112)
Creatinine, Ser: 0.77 mg/dL (ref 0.40–1.20)
GFR: 92.25 mL/min (ref >60.00)
Glucose, Bld: 82 mg/dL (ref 70–99)
Potassium: 3.3 mEq/L — ABNORMAL LOW (ref 3.5–5.1)
Sodium: 141 mEq/L (ref 135–145)
Total Bilirubin: 0.4 mg/dL (ref 0.2–1.2)
Total Protein: 6.4 g/dL (ref 6.0–8.3)

## 2021-01-26 LAB — VITAMIN D 25 HYDROXY (VIT D DEFICIENCY, FRACTURES): VITD: 14.98 ng/mL — ABNORMAL LOW (ref 30.00–100.00)

## 2021-01-26 LAB — IBC PANEL
Iron: 70 ug/dL (ref 42–145)
Saturation Ratios: 18 % — ABNORMAL LOW (ref 20.0–50.0)
TIBC: 389.2 ug/dL (ref 250.0–450.0)
Transferrin: 278 mg/dL (ref 212.0–360.0)

## 2021-01-26 LAB — SEDIMENTATION RATE: Sed Rate: 1 mm/hr (ref 0–20)

## 2021-01-26 LAB — FERRITIN: Ferritin: 22.8 ng/mL (ref 10.0–291.0)

## 2021-01-26 MED ORDER — METHYLPREDNISOLONE ACETATE 80 MG/ML IJ SUSP
80.0000 mg | Freq: Once | INTRAMUSCULAR | Status: AC
  Administered 2021-01-26: 80 mg via INTRAMUSCULAR

## 2021-01-26 MED ORDER — KETOROLAC TROMETHAMINE 60 MG/2ML IM SOLN
60.0000 mg | Freq: Once | INTRAMUSCULAR | Status: AC
  Administered 2021-01-26: 60 mg via INTRAMUSCULAR

## 2021-01-26 MED ORDER — GABAPENTIN 300 MG PO CAPS: 300.0000 mg | ORAL_CAPSULE | Freq: Every day | ORAL | 0 refills | Status: DC

## 2021-01-26 NOTE — Patient Instructions (Addendum)
Take gabapentin 300mg -1-2 pills at night Epidural [phone] Labs today Do not take any anti-inflammatories today See me again 4 weeks after epidural

## 2021-01-26 NOTE — Assessment & Plan Note (Addendum)
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  Seems to be more in the C8 distribution but seems to be severe at C7 and C8 on her imaging.  We discussed starting the gabapentin again.  Patient has had difficulty with her peripheral neuropathy previously.  We will get laboratory work-up for the vitamin D and iron levels that could be contributing as well again.  Patient has failed other medications that was given to her previously and has attempted to do physical therapy with significant increase in pain.  Patient has responded well to epidurals of the lumbar spine and hopefully well with the cervical spine which will be ordered today.  Follow-up with me again 4 weeks after the injection.

## 2021-02-02 ENCOUNTER — Telehealth: Payer: Self-pay | Admitting: Family Medicine

## 2021-02-02 ENCOUNTER — Other Ambulatory Visit: Payer: Self-pay

## 2021-02-02 MED ORDER — VITAMIN D (ERGOCALCIFEROL) 1.25 MG (50000 UNIT) PO CAPS: 50000.0000 [IU] | ORAL_CAPSULE | ORAL | 0 refills | Status: DC

## 2021-02-02 MED ORDER — MELOXICAM 7.5 MG PO TABS: 7.5000 mg | ORAL_TABLET | Freq: Every day | ORAL | 0 refills | Status: DC

## 2021-02-02 NOTE — Telephone Encounter (Signed)
Rx called in. Left voicemail for patient.

## 2021-02-02 NOTE — Telephone Encounter (Signed)
Patient called asking if Dr Katrinka Blazing would be willing to prescribed Meloxicam. She said that she has used this in the past and it really seemed to help. Right now, she is waiting for an epidural but it is currently pending with her insurance company.  Pharmacy: [facility] [address]

## 2021-02-28 ENCOUNTER — Telehealth: Payer: Self-pay

## 2021-02-28 NOTE — Telephone Encounter (Signed)
Patient called stating that she has her epidural on the 26th of this month and would like something sent in to help keep her calm

## 2021-03-01 ENCOUNTER — Other Ambulatory Visit: Payer: Self-pay | Admitting: Family Medicine

## 2021-03-01 MED ORDER — DIAZEPAM 5 MG PO TABS: ORAL_TABLET | ORAL | 0 refills | Status: DC

## 2021-03-01 NOTE — Telephone Encounter (Signed)
Left message for patient

## 2021-03-01 NOTE — Telephone Encounter (Signed)
Sent in valium please tell her to have a driver then

## 2021-03-07 ENCOUNTER — Other Ambulatory Visit: Payer: Self-pay

## 2021-03-07 ENCOUNTER — Ambulatory Visit
Admission: RE | Admit: 2021-03-07 | Discharge: 2021-03-07 | Disposition: A | Discharge status: Home / Self Care | Payer: 59 | Source: Ambulatory Visit | Attending: Family Medicine | Admitting: Family Medicine

## 2021-03-07 DIAGNOSIS — M5412 Radiculopathy, cervical region: Secondary | ICD-10-CM

## 2021-03-07 MED ORDER — IOPAMIDOL (ISOVUE-M 300) INJECTION 61%
1.0000 mL | Freq: Once | INTRAMUSCULAR | Status: AC | PRN
  Administered 2021-03-07: 1 mL via EPIDURAL

## 2021-03-07 MED ORDER — TRIAMCINOLONE ACETONIDE 40 MG/ML IJ SUSP (RADIOLOGY)
60.0000 mg | Freq: Once | INTRAMUSCULAR | Status: AC
  Administered 2021-03-07: 60 mg via EPIDURAL

## 2021-03-07 NOTE — Discharge Instructions (Signed)

## 2021-03-29 NOTE — Progress Notes (Signed)
[name] [address] [place] [address] Phone: [phone] Subjective:   [name], am serving as a scribe for Dr. Antoine Primas.  This visit occurred during the SARS-CoV-2 public health emergency.  Safety protocols were in place, including screening questions prior to the visit, additional usage of staff PPE, and extensive cleaning of exam room while observing appropriate contact time as indicated for disinfecting solutions.   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  Seems to be more in the C8 distribution but seems to be severe at C7 and C8 on her imaging.  We discussed starting the gabapentin again.  Patient has had difficulty with her peripheral neuropathy previously.  We will get laboratory work-up for the vitamin D and iron levels that could be contributing as well again.  Patient has failed other medications that was given to her previously and has attempted to do physical therapy with significant increase in pain.  Patient has responded well to epidurals of the lumbar spine and hopefully well with the cervical spine which will be ordered today.  Follow-up with me again 4 weeks after the injection.  Updated 03/30/2021 Joanna [name] is a 45 y.o. female coming in with complaint of neck pain. Had Epi on 03/07/2021. States that epidural helped her pain. No issues since injection.  Patient states 75% better.  The only thing that has been concerning is patient has continued to have some neuropathy of the finger on the left side.  Patient states that it has not noticed that much until she really focuses.  Patient states otherwise is feeling significantly better.     Past Medical History:  Diagnosis Date   Anemia    during the time she had fibroids   Anxiety     Arthritis    Depression    Heart murmur    born with a heart murmur, but cleared up years ago   Migraine    occasional   PONV (postoperative nausea and vomiting)    some dry heaving on waking up   Past Surgical History:  Procedure Laterality Date   ABDOMINAL HYSTERECTOMY     BREAST SURGERY     CESAREAN SECTION     RADIOLOGY WITH ANESTHESIA N/A 12/04/2019   Procedure: MRI PELVIS WITH AND WITHOUT CONTRAST,LUMBER SPINE WITHOUT CONTRAST;  Surgeon: Radiologist, Medication, MD;  Location: [facility] OR;  Service: Radiology;  Laterality: N/A;   REDUCTION MAMMAPLASTY Bilateral 2001   Social History   Socioeconomic History   Marital status: Divorced    Spouse name: Not on file   Number of children: Not on file   Years of education: Not on file   Highest education level: Not on file  Occupational History   Not on file  Tobacco Use   Smoking status: Former   Smokeless tobacco: Never   Tobacco comments:    as of 12/02/19 has been 15 years  Vaping Use   Vaping Use: Never used  Substance and Sexual Activity   Alcohol use: No   Drug use: No   Sexual activity: Yes    Birth control/protection: Surgical  Other Topics Concern   Not on file  Social History Narrative   Works at [employer] - [employer]    Daughter - 2008.   Goes  to [employer] school   Social Determinants of Health   Financial Resource Strain: Not on file  Food Insecurity: Not on file  Transportation Needs: Not on file  Physical Activity: Not on file  Stress: Not on file  Social Connections: Not on file       Current Outpatient Medications (Analgesics):    Ibuprofen-Famotidine 800-26.6 MG TABS, TAKE ONE TABLET BY MOUTH THREE TIMES A DAY AS NEEDED   meloxicam (MOBIC) 7.5 MG tablet, TAKE 1 TABLET BY MOUTH EVERY DAY   Current Outpatient Medications (Other):    diazepam (VALIUM) 5 MG tablet, One tab by mouth, 2 hours before procedure.   gabapentin (NEURONTIN) 300 MG capsule, Take 2 capsules (600 mg  total) by mouth 3 (three) times daily as needed.   gabapentin (NEURONTIN) 300 MG capsule, Take 1 capsule (300 mg total) by mouth at bedtime.   tiZANidine (ZANAFLEX) 4 MG tablet, TAKE 1 TABLET (4 MG TOTAL) BY MOUTH NIGHTLY FOR 10 DAYS, AS DIRECTED BY MD   Vilazodone HCl (VIIBRYD) 10 MG TABS, Take 1 tablet (10 mg total) by mouth daily. Take 10mg  daily for 2 weeks and then 20 mg daily there after.   Vitamin D, Ergocalciferol, (DRISDOL) 1.25 MG (50000 UNIT) CAPS capsule, TAKE 1 CAPSULE (50,000 UNITS TOTAL) BY MOUTH EVERY 7 (SEVEN) DAYS.   Vitamin D, Ergocalciferol, (DRISDOL) 1.25 MG (50000 UNIT) CAPS capsule, Take 1 capsule (50,000 Units total) by mouth every 7 (seven) days.    Review of Systems:  No headache, visual changes, nausea, vomiting, diarrhea, constipation, dizziness, abdominal pain, skin rash, fevers, chills, night sweats, weight loss, swollen lymph nodes, body aches, joint swelling, chest pain, shortness of breath, mood changes. POSITIVE muscle aches but improvement  Objective  Blood pressure 110/82, pulse 99, height 5\' 1"  (1.549 m), weight 200 lb (90.7 kg), SpO2 97 %.   General: No apparent distress alert and oriented x3 mood and affect normal, dressed appropriately.  HEENT: Pupils equal, extraocular movements intact  Respiratory: Patient's speak in full sentences and does not appear short of breath  Cardiovascular: No lower extremity edema, non tender, no erythema  Gait normal with good balance and coordination.  MSK: Neck exam shows the patient does have full loss of lordosis.  Patient does have some mild tightness with sidebending.  Patient's hand exam shows the patient still has some weakness noted in the C7 distribution on the left hand only.  No thenar eminence wasting noted at this time.  Patient does seem to be neurovascularly intact.    Impression and Recommendations:    The above documentation has been reviewed and is accurate and complete , DO

## 2021-03-30 ENCOUNTER — Encounter: Payer: Self-pay | Admitting: Family Medicine

## 2021-03-30 ENCOUNTER — Other Ambulatory Visit: Payer: Self-pay

## 2021-03-30 ENCOUNTER — Ambulatory Visit (INDEPENDENT_AMBULATORY_CARE_PROVIDER_SITE_OTHER): Payer: 59 | Admitting: Family Medicine

## 2021-03-30 DIAGNOSIS — M5010 Cervical disc disorder with radiculopathy, unspecified cervical region: Secondary | ICD-10-CM

## 2021-03-30 NOTE — Patient Instructions (Signed)
Great to see you Stay active Watches cramping in hands May need to be more aggressive with iron or consider CT injections See me in 6-8 weeks

## 2021-03-30 NOTE — Assessment & Plan Note (Signed)
Patient has responded extremely well to the epidural.  Feeling 75% better.  Still has some mild weakness in the C7 and C8 distribution on the left side.  We discussed with patient about the possibility of further work-up including a nerve conduction study or the possibility for an injection for carpal tunnel.  Patient declined these at the moment.  Wants to try to continue to give it more time.  Patient will increase activity slowly.  Patient will call us if any significant other changes occur.  Patient will follow up with me again in 6 to 8 weeks to make sure we are making improvement in the strength.

## 2021-04-01 ENCOUNTER — Other Ambulatory Visit: Payer: Self-pay | Admitting: Family Medicine

## 2021-04-22 ENCOUNTER — Other Ambulatory Visit: Payer: Self-pay | Admitting: Family Medicine

## 2021-05-08 ENCOUNTER — Other Ambulatory Visit: Payer: Self-pay | Admitting: Family Medicine

## 2021-05-10 NOTE — Progress Notes (Signed)
[name] [address] [place] [address] Phone: [phone] Subjective:   Joanna [name], am serving as a scribe for Dr. Antoine Primas. This visit occurred during the SARS-CoV-2 public health emergency.  Safety protocols were in place, including screening questions prior to the visit, additional usage of staff PPE, and extensive cleaning of exam room while observing appropriate contact time as indicated for disinfecting solutions.   I'm seeing this patient by the request  of:  Carney Living, MD  CC: neck pain   [id number]  03/30/2021 Patient has responded extremely well to the epidural.  Feeling 75% better.  Still has some mild weakness in the C7 and C8 distribution on the left side.  We discussed with patient about the possibility of further work-up including a nerve conduction study or the possibility for an injection for carpal tunnel.  Patient declined these at the moment.  Wants to try to continue to give it more time.  Patient will increase activity slowly.  Patient will call us if any significant other changes occur.  Patient will follow up with me again in 6 to 8 weeks to make sure we are making improvement in the strength.  Updated 05/11/2021 Joanna [name] is a 45 y.o. female coming in with complaint of neck pain. Patient states pain has decreased. Numbness in finger is still in her left pointer finger. No other complaints. Overall feeling 95% better than when she was at her worst.  Continues to take the gabapentin, Zanaflex and meloxicam      Past Medical History:  Diagnosis Date   Anemia    during the time she had fibroids   Anxiety    Arthritis    Depression    Heart murmur    born with a heart murmur, but cleared up years ago   Migraine    occasional   PONV (postoperative nausea and vomiting)    some dry heaving on waking up   Past Surgical History:  Procedure Laterality Date   ABDOMINAL HYSTERECTOMY      BREAST SURGERY     CESAREAN SECTION     RADIOLOGY WITH ANESTHESIA N/A 12/04/2019   Procedure: MRI PELVIS WITH AND WITHOUT CONTRAST,LUMBER SPINE WITHOUT CONTRAST;  Surgeon: Radiologist, Medication, MD;  Location: [facility] OR;  Service: Radiology;  Laterality: N/A;   REDUCTION MAMMAPLASTY Bilateral 2001   Social History   Socioeconomic History   Marital status: Divorced    Spouse name: Not on file   Number of children: Not on file   Years of education: Not on file   Highest education level: Not on file  Occupational History   Not on file  Tobacco Use   Smoking status: Former   Smokeless tobacco: Never   Tobacco comments:    as of 12/02/19 has been 15 years  Vaping Use   Vaping Use: Never used  Substance and Sexual Activity   Alcohol use: No   Drug use: No   Sexual activity: Yes    Birth control/protection: Surgical  Other Topics Concern   Not on file  Social History Narrative   Works at [employer] - [employer]    Daughter - 2008.   Goes to [employer] school   Social Determinants of Health   Financial Resource Strain: Not on file  Food Insecurity: Not on file  Transportation Needs: Not on file  Physical Activity: Not on file  Stress: Not on file  Social Connections: Not  on file   No Known Allergies No family history on file.     Current Outpatient Medications (Analgesics):    Ibuprofen-Famotidine 800-26.6 MG TABS, TAKE ONE TABLET BY MOUTH THREE TIMES A DAY AS NEEDED   meloxicam (MOBIC) 7.5 MG tablet, TAKE 1 TABLET BY MOUTH EVERY DAY   Current Outpatient Medications (Other):    diazepam (VALIUM) 5 MG tablet, One tab by mouth, 2 hours before procedure.   gabapentin (NEURONTIN) 300 MG capsule, Take 2 capsules (600 mg total) by mouth 3 (three) times daily as needed.   gabapentin (NEURONTIN) 300 MG capsule, Take 1 capsule (300 mg total) by mouth at bedtime.   tiZANidine (ZANAFLEX) 4 MG tablet, TAKE 1 TABLET (4 MG TOTAL) BY MOUTH NIGHTLY FOR 10 DAYS, AS  DIRECTED BY MD   Vilazodone HCl (VIIBRYD) 10 MG TABS, Take 1 tablet (10 mg total) by mouth daily. Take 10mg  daily for 2 weeks and then 20 mg daily there after.   Vitamin D, Ergocalciferol, (DRISDOL) 1.25 MG (50000 UNIT) CAPS capsule, TAKE 1 CAPSULE (50,000 UNITS TOTAL) BY MOUTH EVERY 7 (SEVEN) DAYS.   Vitamin D, Ergocalciferol, (DRISDOL) 1.25 MG (50000 UNIT) CAPS capsule, TAKE 1 CAPSULE (50,000 UNITS TOTAL) BY MOUTH EVERY 7 (SEVEN) DAYS    Objective  Blood pressure (!) 132/92, pulse 90, height 5\' 1"  (1.549 m), weight 207 lb (93.9 kg), SpO2 98 %.   General: No apparent distress alert and oriented x3 mood and affect normal, dressed appropriately.  HEENT: Pupils equal, extraocular movements intact  Respiratory: Patient's speak in full sentences and does not appear short of breath  Cardiovascular: No lower extremity edema, non tender, no erythema  Gait normal with good balance and coordination.  MSK: Neck exam very mild loss of lordosis.  Good grip strength noted.  Patient is sitting comfortably.    Impression and Recommendations:     The above documentation has been reviewed and is accurate and complete , DO

## 2021-05-11 ENCOUNTER — Other Ambulatory Visit: Payer: Self-pay

## 2021-05-11 ENCOUNTER — Ambulatory Visit (INDEPENDENT_AMBULATORY_CARE_PROVIDER_SITE_OTHER): Payer: 59 | Admitting: Family Medicine

## 2021-05-11 DIAGNOSIS — M5010 Cervical disc disorder with radiculopathy, unspecified cervical region: Secondary | ICD-10-CM | POA: Diagnosis not present

## 2021-05-11 NOTE — Assessment & Plan Note (Signed)
Patient is feeling much better at this time.  No significant change in management.  We discussed trying to titrate off of the meloxicam on a regular basis and use in the morning versus formation.  Follow-up with me as needed

## 2021-05-11 NOTE — Patient Instructions (Signed)
So great to see you So happy you are doing so well  Happy holidays!  Try to do the meloxicam in 5 day bursts when needed instead of the daily  See me when you need me

## 2021-06-18 ENCOUNTER — Other Ambulatory Visit: Payer: Self-pay | Admitting: Family Medicine

## 2021-06-20 ENCOUNTER — Encounter: Payer: Self-pay | Admitting: Internal Medicine

## 2021-06-21 ENCOUNTER — Emergency Department (HOSPITAL_BASED_OUTPATIENT_CLINIC_OR_DEPARTMENT_OTHER)
Admission: EM | Admit: 2021-06-21 | Discharge: 2021-06-21 | Disposition: A | Discharge status: Home / Self Care | Payer: 59 | Attending: Emergency Medicine | Admitting: Emergency Medicine

## 2021-06-21 ENCOUNTER — Other Ambulatory Visit: Payer: Self-pay

## 2021-06-21 DIAGNOSIS — G43909 Migraine, unspecified, not intractable, without status migrainosus: Secondary | ICD-10-CM | POA: Diagnosis present

## 2021-06-21 DIAGNOSIS — G43809 Other migraine, not intractable, without status migrainosus: Secondary | ICD-10-CM | POA: Insufficient documentation

## 2021-06-21 MED ORDER — KETOROLAC TROMETHAMINE 30 MG/ML IJ SOLN
30.0000 mg | Freq: Once | INTRAMUSCULAR | Status: AC
  Administered 2021-06-21: 30 mg via INTRAVENOUS
  Filled 2021-06-21: qty 1

## 2021-06-21 MED ORDER — DIPHENHYDRAMINE HCL 50 MG/ML IJ SOLN
25.0000 mg | Freq: Once | INTRAMUSCULAR | Status: AC
  Administered 2021-06-21: 25 mg via INTRAVENOUS
  Filled 2021-06-21: qty 1

## 2021-06-21 MED ORDER — PROCHLORPERAZINE EDISYLATE 10 MG/2ML IJ SOLN
10.0000 mg | Freq: Once | INTRAMUSCULAR | Status: AC
  Administered 2021-06-21: 10 mg via INTRAVENOUS
  Filled 2021-06-21: qty 2

## 2021-06-21 NOTE — ED Notes (Signed)
Pt d/c home per MD order. Discharge summary reviewed, verbalize understanding. Ambulatory off unit. No s/s of acute distress noted. Reports visitor is discharge ride home.

## 2021-06-21 NOTE — ED Provider Notes (Signed)
[facility] EMERGENCY DEPT Provider Note   CSN: [id number] Arrival date & time: 06/21/21  1038     History  Chief Complaint  Patient presents with   Migraine    Joanna [name] is a 46 y.o. female.   Migraine   Patient presents to the ED for evaluation of a migraine headache.  Patient has a history of migraine headaches and presents with a headache that is similar to previous but has not gotten any better.  Patient states she felt the migraine headache coming on yesterday.  She tried taking some Excedrin Migraine and it started to help a little bit but then the headache progressed and worsened.  Patient is having a lot of photophobia.  She is also having phonophobia.  Has had nausea but no vomiting.  She denies any fevers or chills.  No numbness or weakness.  Home Medications Prior to Admission medications   Medication Sig Start Date End Date Taking? Authorizing Provider  diazepam (VALIUM) 5 MG tablet One tab by mouth, 2 hours before procedure. 03/01/21   Judi Saa, DO  gabapentin (NEURONTIN) 300 MG capsule Take 2 capsules (600 mg total) by mouth 3 (three) times daily as needed. 11/24/20   Carney Living, MD  gabapentin (NEURONTIN) 300 MG capsule Take 1 capsule (300 mg total) by mouth at bedtime. 01/26/21   Judi Saa, DO  Ibuprofen-Famotidine 800-26.6 MG TABS TAKE ONE TABLET BY MOUTH THREE TIMES A DAY AS NEEDED 06/24/20   Judi Saa, DO  meloxicam (MOBIC) 7.5 MG tablet TAKE 1 TABLET BY MOUTH EVERY DAY 06/20/21   Judi Saa, DO  tiZANidine (ZANAFLEX) 4 MG tablet TAKE 1 TABLET (4 MG TOTAL) BY MOUTH NIGHTLY FOR 10 DAYS, AS DIRECTED BY MD 05/09/21   Judi Saa, DO  Vilazodone HCl (VIIBRYD) 10 MG TABS Take 1 tablet (10 mg total) by mouth daily. Take 10mg  daily for 2 weeks and then 20 mg daily there after. 11/11/19   01/11/20, DO  Vitamin D, Ergocalciferol, (DRISDOL) 1.25 MG (50000 UNIT) CAPS capsule TAKE 1 CAPSULE (50,000 UNITS TOTAL)  BY MOUTH EVERY 7 (SEVEN) DAYS. 12/29/19   12/31/19, DO  Vitamin D, Ergocalciferol, (DRISDOL) 1.25 MG (50000 UNIT) CAPS capsule TAKE 1 CAPSULE (50,000 UNITS TOTAL) BY MOUTH EVERY 7 (SEVEN) DAYS 04/22/21   13/11/22, DO      Allergies    Patient has no known allergies.    Review of Systems   Review of Systems  All other systems reviewed and are negative.  Physical Exam Updated Vital Signs BP 106/64    Pulse 87    Temp 99.3 F (37.4 C) (Oral)    Resp 18    Ht 1.549 m (5\' 1" )    Wt 93.9 kg    SpO2 100%    BMI 39.11 kg/m  Physical Exam Vitals and nursing note reviewed.  Constitutional:      General: She is not in acute distress.    Appearance: She is well-developed.  HENT:     Head: Normocephalic and atraumatic.     Right Ear: External ear normal.     Left Ear: External ear normal.  Eyes:     General: No scleral icterus.       Right eye: No discharge.        Left eye: No discharge.     Conjunctiva/sclera: Conjunctivae normal.  Neck:     Trachea: No tracheal deviation.  Cardiovascular:  Rate and Rhythm: Normal rate and regular rhythm.  Pulmonary:     Effort: Pulmonary effort is normal. No respiratory distress.     Breath sounds: Normal breath sounds. No stridor. No wheezing or rales.  Abdominal:     General: Bowel sounds are normal. There is no distension.     Palpations: Abdomen is soft.     Tenderness: There is no abdominal tenderness. There is no guarding or rebound.  Musculoskeletal:        General: No tenderness or deformity.     Cervical back: Neck supple. No rigidity.  Skin:    General: Skin is warm and dry.     Findings: No rash.  Neurological:     General: No focal deficit present.     Mental Status: She is alert.     Cranial Nerves: No cranial nerve deficit (no facial droop, extraocular movements intact, no slurred speech).     Sensory: No sensory deficit.     Motor: No abnormal muscle tone or seizure activity.     Coordination: Coordination  normal.     Comments: Normal strength and sensation bilateral upper extremities and lower extremities  Psychiatric:        Mood and Affect: Mood normal.    ED Results / Procedures / Treatments   Labs (all labs ordered are listed, but only abnormal results are displayed) Labs Reviewed - No data to display  EKG None  Radiology No results found.  Procedures Procedures    Medications Ordered in ED Medications  prochlorperazine (COMPAZINE) injection 10 mg (10 mg Intravenous Given 06/21/21 1116)  ketorolac (TORADOL) 30 MG/ML injection 30 mg (30 mg Intravenous Given 06/21/21 1115)  diphenhydrAMINE (BENADRYL) injection 25 mg (25 mg Intravenous Given 06/21/21 1115)    ED Course/ Medical Decision Making/ A&P                           Medical Decision Making  Patient presented to the ED for evaluation of a headache consistent with her migraine headaches.  Patient has had similar symptoms in the past.  No fevers or chills to suggest infectious etiology.  No thunderclap onset to suggest subarachnoid hemorrhage or other emergent etiology.  Patient improved with migraine cocktail.  I do not feel that head CT imaging is necessary based on her history of migraines and improvement with treatment.        Final Clinical Impression(s) / ED Diagnoses Final diagnoses:  Other migraine without status migrainosus, not intractable    Rx / DC Orders ED Discharge Orders     None         Linwood Dibbles, MD 06/21/21 1409

## 2021-06-21 NOTE — ED Triage Notes (Signed)
Pt to ED via POV c/o headache, started yesterday; progressively getting worse. Loud noise, bright light exaggerate symptoms. HX migraines. Reports minor relief with OTC meds.

## 2021-06-25 ENCOUNTER — Other Ambulatory Visit: Payer: Self-pay

## 2021-06-25 ENCOUNTER — Encounter (HOSPITAL_BASED_OUTPATIENT_CLINIC_OR_DEPARTMENT_OTHER): Payer: Self-pay | Admitting: Emergency Medicine

## 2021-06-25 DIAGNOSIS — R112 Nausea with vomiting, unspecified: Secondary | ICD-10-CM | POA: Diagnosis present

## 2021-06-25 MED ORDER — ONDANSETRON 4 MG PO TBDP
4.0000 mg | ORAL_TABLET | Freq: Once | ORAL | Status: AC | PRN
  Administered 2021-06-25: 4 mg via ORAL
  Filled 2021-06-25: qty 1

## 2021-06-25 NOTE — ED Triage Notes (Signed)
Pt c/o emesis ongoing since 06/20/2021. Pt reports unable to tolerate PO food or fluids. Pt reports headache resolved.

## 2021-06-26 ENCOUNTER — Emergency Department (HOSPITAL_BASED_OUTPATIENT_CLINIC_OR_DEPARTMENT_OTHER)
Admission: EM | Admit: 2021-06-26 | Discharge: 2021-06-26 | Disposition: A | Discharge status: Home / Self Care | Payer: 59 | Attending: Emergency Medicine | Admitting: Emergency Medicine

## 2021-06-26 DIAGNOSIS — R112 Nausea with vomiting, unspecified: Secondary | ICD-10-CM

## 2021-06-26 LAB — COMPREHENSIVE METABOLIC PANEL
ALT: 8 U/L (ref 0–44)
AST: 14 U/L — ABNORMAL LOW (ref 15–41)
Albumin: 4 g/dL (ref 3.5–5.0)
Alkaline Phosphatase: 43 U/L (ref 38–126)
Anion gap: 12 (ref 5–15)
BUN: 7 mg/dL (ref 6–20)
CO2: 23 mmol/L (ref 22–32)
Calcium: 9 mg/dL (ref 8.9–10.3)
Chloride: 103 mmol/L (ref 98–111)
Creatinine, Ser: 0.69 mg/dL (ref 0.44–1.00)
GFR, Estimated: >60 mL/min (ref >60)
Glucose, Bld: 106 mg/dL — ABNORMAL HIGH (ref 70–99)
Potassium: 3.1 mmol/L — ABNORMAL LOW (ref 3.5–5.1)
Sodium: 138 mmol/L (ref 135–145)
Total Bilirubin: 0.7 mg/dL (ref 0.3–1.2)
Total Protein: 7 g/dL (ref 6.5–8.1)

## 2021-06-26 LAB — URINALYSIS, ROUTINE W REFLEX MICROSCOPIC
Bilirubin Urine: NEGATIVE
Glucose, UA: NEGATIVE mg/dL
Hgb urine dipstick: NEGATIVE
Ketones, ur: 15 mg/dL — AB
Leukocytes,Ua: NEGATIVE
Nitrite: NEGATIVE
Specific Gravity, Urine: 1.018 (ref 1.005–1.030)
pH: 5.5 (ref 5.0–8.0)

## 2021-06-26 LAB — CBC
HCT: 40.4 % (ref 36.0–46.0)
Hemoglobin: 14.2 g/dL (ref 12.0–15.0)
MCH: 27.9 pg (ref 26.0–34.0)
MCHC: 35.1 g/dL (ref 30.0–36.0)
MCV: 79.4 fL — ABNORMAL LOW (ref 80.0–100.0)
Platelets: 388 10*3/uL (ref 150–400)
RBC: 5.09 MIL/uL (ref 3.87–5.11)
RDW: 12.3 % (ref 11.5–15.5)
WBC: 7.3 10*3/uL (ref 4.0–10.5)
nRBC: 0 % (ref 0.0–0.2)

## 2021-06-26 LAB — LIPASE, BLOOD: Lipase: 16 U/L (ref 11–51)

## 2021-06-26 MED ORDER — ONDANSETRON 8 MG PO TBDP: ORAL_TABLET | ORAL | 0 refills | Status: DC

## 2021-06-26 MED ORDER — SODIUM CHLORIDE 0.9 % IV BOLUS
1000.0000 mL | Freq: Once | INTRAVENOUS | Status: AC
  Administered 2021-06-26: 1000 mL via INTRAVENOUS

## 2021-06-26 MED ORDER — KETOROLAC TROMETHAMINE 30 MG/ML IJ SOLN
30.0000 mg | Freq: Once | INTRAMUSCULAR | Status: AC
  Administered 2021-06-26: 30 mg via INTRAVENOUS
  Filled 2021-06-26: qty 1

## 2021-06-26 MED ORDER — ONDANSETRON HCL 4 MG/2ML IJ SOLN
4.0000 mg | Freq: Once | INTRAMUSCULAR | Status: AC
  Administered 2021-06-26: 4 mg via INTRAVENOUS
  Filled 2021-06-26: qty 2

## 2021-06-26 NOTE — ED Provider Notes (Signed)
[facility] EMERGENCY DEPT Provider Note   CSN: [id number] Arrival date & time: 06/25/21  2334     History  Chief Complaint  Patient presents with   Emesis    Joanna [name] is a 46 y.o. female.  Patient is a 46 year old female with past medical history of migraine headaches and fibromyalgia.  Patient presenting today for evaluation of vomiting and nausea.  She states that this is been ongoing for the past 5 days.  She reports little p.o. intake due to these symptoms.  She denies any diarrhea or abdominal pain.  She denies any fevers or chills.  She was seen here 5 days ago with a migraine.  She was given a migraine cocktail which helped her headache, however this nausea and vomiting ensued and has not gone away.  The history is provided by the patient.  Emesis Severity:  Moderate Duration:  5 days Timing:  Constant Quality:  Stomach contents Progression:  Worsening Chronicity:  New Relieved by:  Nothing Worsened by:  Nothing Ineffective treatments:  None tried Associated symptoms: no abdominal pain, no diarrhea, no fever and no URI       Home Medications Prior to Admission medications   Medication Sig Start Date End Date Taking? Authorizing Provider  diazepam (VALIUM) 5 MG tablet One tab by mouth, 2 hours before procedure. 03/01/21   Judi Saa, DO  gabapentin (NEURONTIN) 300 MG capsule Take 2 capsules (600 mg total) by mouth 3 (three) times daily as needed. 11/24/20   Carney Living, MD  gabapentin (NEURONTIN) 300 MG capsule Take 1 capsule (300 mg total) by mouth at bedtime. 01/26/21   Judi Saa, DO  Ibuprofen-Famotidine 800-26.6 MG TABS TAKE ONE TABLET BY MOUTH THREE TIMES A DAY AS NEEDED 06/24/20   Judi Saa, DO  meloxicam (MOBIC) 7.5 MG tablet TAKE 1 TABLET BY MOUTH EVERY DAY 06/20/21   Judi Saa, DO  tiZANidine (ZANAFLEX) 4 MG tablet TAKE 1 TABLET (4 MG TOTAL) BY MOUTH NIGHTLY FOR 10 DAYS, AS DIRECTED BY MD 05/09/21   Judi Saa, DO  Vilazodone HCl (VIIBRYD) 10 MG TABS Take 1 tablet (10 mg total) by mouth daily. Take 10mg  daily for 2 weeks and then 20 mg daily there after. 11/11/19   01/11/20, DO  Vitamin D, Ergocalciferol, (DRISDOL) 1.25 MG (50000 UNIT) CAPS capsule TAKE 1 CAPSULE (50,000 UNITS TOTAL) BY MOUTH EVERY 7 (SEVEN) DAYS. 12/29/19   12/31/19, DO  Vitamin D, Ergocalciferol, (DRISDOL) 1.25 MG (50000 UNIT) CAPS capsule TAKE 1 CAPSULE (50,000 UNITS TOTAL) BY MOUTH EVERY 7 (SEVEN) DAYS 04/22/21   13/11/22, DO      Allergies    Patient has no known allergies.    Review of Systems   Review of Systems  Constitutional:  Negative for fever.  Gastrointestinal:  Positive for vomiting. Negative for abdominal pain and diarrhea.  All other systems reviewed and are negative.  Physical Exam Updated Vital Signs BP (!) 140/91 (BP Location: Left Arm)    Pulse 75    Temp 98.1 F (36.7 C) (Oral)    Resp 20    Ht 5\' 1"  (1.549 m)    Wt 93.9 kg    SpO2 97%    BMI 39.11 kg/m  Physical Exam Vitals and nursing note reviewed.  Constitutional:      General: She is not in acute distress.    Appearance: She is well-developed. She is not diaphoretic.  HENT:  Head: Normocephalic and atraumatic.     Mouth/Throat:     Mouth: Mucous membranes are moist.     Pharynx: No oropharyngeal exudate or posterior oropharyngeal erythema.  Cardiovascular:     Rate and Rhythm: Normal rate and regular rhythm.     Heart sounds: No murmur heard.   No friction rub. No gallop.  Pulmonary:     Effort: Pulmonary effort is normal. No respiratory distress.     Breath sounds: Normal breath sounds. No wheezing.  Abdominal:     General: Bowel sounds are normal. There is no distension.     Palpations: Abdomen is soft.     Tenderness: There is no abdominal tenderness.  Musculoskeletal:        General: Normal range of motion.     Cervical back: Normal range of motion and neck supple.  Skin:    General: Skin is warm  and dry.  Neurological:     General: No focal deficit present.     Mental Status: She is alert and oriented to person, place, and time.    ED Results / Procedures / Treatments   Labs (all labs ordered are listed, but only abnormal results are displayed) Labs Reviewed  COMPREHENSIVE METABOLIC PANEL - Abnormal; Notable for the following components:      Result Value   Potassium 3.1 (*)    Glucose, Bld 106 (*)    AST 14 (*)    All other components within normal limits  CBC - Abnormal; Notable for the following components:   MCV 79.4 (*)    All other components within normal limits  LIPASE, BLOOD  URINALYSIS, ROUTINE W REFLEX MICROSCOPIC    EKG None  Radiology No results found.  Procedures Procedures    Medications Ordered in ED Medications  sodium chloride 0.9 % bolus 1,000 mL (has no administration in time range)  ondansetron (ZOFRAN) injection 4 mg (has no administration in time range)  ketorolac (TORADOL) 30 MG/ML injection 30 mg (has no administration in time range)  ondansetron (ZOFRAN-ODT) disintegrating tablet 4 mg (4 mg Oral Given 06/25/21 2350)    ED Course/ Medical Decision Making/ A&P  Patient presenting with complaints of nausea and vomiting, the etiology of which I suspect is viral.  Her abdomen is benign and laboratory studies are reassuring.  Patient was given IV fluids and Zofran and is now tolerating liquids without difficulty.  At this point, I feel as though patient can safely be discharged.  She is to return as needed if symptoms worsen.  Zofran to be prescribed as needed for nausea.  Final Clinical Impression(s) / ED Diagnoses Final diagnoses:  None    Rx / DC Orders ED Discharge Orders     None         Geoffery Lyons, MD 06/26/21 [phone]

## 2021-06-26 NOTE — ED Notes (Signed)
Drink provided, pt tolerated well

## 2021-06-26 NOTE — Discharge Instructions (Signed)
Begin taking Zofran as prescribed as needed for nausea.  Clear liquid diet for the next 12 hours, then slowly advance to normal as tolerated.  Return to the ER if you develop severe abdominal pain, bloody stools, high fevers, or other new and concerning symptoms.

## 2021-07-22 ENCOUNTER — Other Ambulatory Visit: Payer: Self-pay

## 2021-07-22 ENCOUNTER — Ambulatory Visit (AMBULATORY_SURGERY_CENTER): Payer: 59 | Admitting: *Deleted

## 2021-07-22 VITALS — Ht 61.0 in | Wt 193.0 lb

## 2021-07-22 DIAGNOSIS — Z1211 Encounter for screening for malignant neoplasm of colon: Secondary | ICD-10-CM

## 2021-07-22 MED ORDER — PEG 3350-KCL-NA BICARB-NACL 420 G PO SOLR: 4000.0000 mL | Freq: Once | ORAL | 0 refills | Status: AC

## 2021-07-22 NOTE — Progress Notes (Signed)
No egg or soy allergy known to patient  No issues known to pt with past sedation with any surgeries or procedures Patient denies ever being told they had issues or difficulty with intubation  No FH of Malignant Hyperthermia Pt instructed to hold Phentermine after Feb. 12th Pt is not on  home 02  Pt is not on blood thinners  Pt denies issues with constipation  No A fib or A flutter     Due to the COVID-19 pandemic we are asking patients to follow certain guidelines in PV and the [facility]   Pt aware of COVID protocols and [facility] guidelines   PV completed over the phone. Pt verified name, DOB, address and insurance during PV today.  Pt mailed instruction packet with copy of consent form to read and not return, and instructions.  Pt encouraged to call with questions or issues.  If pt has My chart, procedure instructions sent via My Chart

## 2021-07-30 ENCOUNTER — Other Ambulatory Visit: Payer: Self-pay | Admitting: Family Medicine

## 2021-08-03 ENCOUNTER — Encounter: Payer: Self-pay | Admitting: Internal Medicine

## 2021-08-05 ENCOUNTER — Encounter: Payer: Self-pay | Admitting: Internal Medicine

## 2021-08-05 ENCOUNTER — Ambulatory Visit (AMBULATORY_SURGERY_CENTER): Payer: 59 | Admitting: Internal Medicine

## 2021-08-05 VITALS — BP 107/65 | HR 63 | Temp 96.9°F | Resp 15 | Ht 63.0 in | Wt 193.0 lb

## 2021-08-05 DIAGNOSIS — Z1211 Encounter for screening for malignant neoplasm of colon: Secondary | ICD-10-CM

## 2021-08-05 MED ORDER — SODIUM CHLORIDE 0.9 % IV SOLN: 500.0000 mL | Freq: Once | INTRAVENOUS | Status: DC

## 2021-08-05 NOTE — Progress Notes (Signed)
To Pacu, VSS. Report to [email or web address] 

## 2021-08-05 NOTE — Progress Notes (Signed)
GASTROENTEROLOGY PROCEDURE H&P NOTE   Primary Care Physician: Carney Living, MD    Reason for Procedure:   Colon cancer screening  Plan:    Colonoscopy  Patient is appropriate for endoscopic procedure(s) in the ambulatory ([facility]) setting.  The nature of the procedure, as well as the risks, benefits, and alternatives were carefully and thoroughly reviewed with the patient. Ample time for discussion and questions allowed. The patient understood, was satisfied, and agreed to proceed.     HPI: Joanna [name] is a 46 y.o. female who presents for colonoscopy for colon cancer screening. Denies blood in stools, changes in bowel habits, weight loss. Denies fam hx of colon cancer.  Past Medical History:  Diagnosis Date   Anemia    during the time she had fibroids   Anxiety    Arthritis    Depression    Heart murmur    born with a heart murmur, but cleared up years ago   Migraine    occasional   PONV (postoperative nausea and vomiting)    some dry heaving on waking up    Past Surgical History:  Procedure Laterality Date   ABDOMINAL HYSTERECTOMY     BREAST SURGERY     CESAREAN SECTION     RADIOLOGY WITH ANESTHESIA N/A 12/04/2019   Procedure: MRI PELVIS WITH AND WITHOUT CONTRAST,LUMBER SPINE WITHOUT CONTRAST;  Surgeon: Radiologist, Medication, MD;  Location: [facility] OR;  Service: Radiology;  Laterality: N/A;   REDUCTION MAMMAPLASTY Bilateral 2001    Prior to Admission medications   Medication Sig Start Date End Date Taking? Authorizing Provider  gabapentin (NEURONTIN) 300 MG capsule Take 2 capsules (600 mg total) by mouth 3 (three) times daily as needed. 11/24/20  Yes Carney Living, MD  meloxicam (MOBIC) 7.5 MG tablet TAKE 1 TABLET BY MOUTH EVERY DAY 08/02/21  Yes Antoine Primas M, DO  ondansetron (ZOFRAN-ODT) 8 MG disintegrating tablet 8mg  ODT q4 hours prn nausea 06/26/21  Yes Delo, 06/28/21, MD  phentermine 15 MG capsule Take 15 mg by mouth every morning.   Yes  [provider]  tiZANidine (ZANAFLEX) 4 MG tablet TAKE 1 TABLET (4 MG TOTAL) BY MOUTH NIGHTLY FOR 10 DAYS, AS DIRECTED BY MD 05/09/21   05/11/21, DO  Vilazodone HCl (VIIBRYD) 10 MG TABS Take 1 tablet (10 mg total) by mouth daily. Take 10mg  daily for 2 weeks and then 20 mg daily there after. Patient not taking: Reported on 07/22/2021 11/11/19   09/19/2021, DO  Vitamin D, Ergocalciferol, (DRISDOL) 1.25 MG (50000 UNIT) CAPS capsule TAKE 1 CAPSULE (50,000 UNITS TOTAL) BY MOUTH EVERY 7 (SEVEN) DAYS. Patient not taking: Reported on 07/22/2021 12/29/19   09/19/2021, DO    Current Outpatient Medications  Medication Sig Dispense Refill   gabapentin (NEURONTIN) 300 MG capsule Take 2 capsules (600 mg total) by mouth 3 (three) times daily as needed. 540 capsule 2   meloxicam (MOBIC) 7.5 MG tablet TAKE 1 TABLET BY MOUTH EVERY DAY 30 tablet 0   ondansetron (ZOFRAN-ODT) 8 MG disintegrating tablet 8mg  ODT q4 hours prn nausea 10 tablet 0   phentermine 15 MG capsule Take 15 mg by mouth every morning.     tiZANidine (ZANAFLEX) 4 MG tablet TAKE 1 TABLET (4 MG TOTAL) BY MOUTH NIGHTLY FOR 10 DAYS, AS DIRECTED BY MD 90 tablet 1   Vilazodone HCl (VIIBRYD) 10 MG TABS Take 1 tablet (10 mg total) by mouth daily. Take 10mg  daily for 2 weeks  and then 20 mg daily there after. (Patient not taking: Reported on 07/22/2021) 30 tablet 1   Vitamin D, Ergocalciferol, (DRISDOL) 1.25 MG (50000 UNIT) CAPS capsule TAKE 1 CAPSULE (50,000 UNITS TOTAL) BY MOUTH EVERY 7 (SEVEN) DAYS. (Patient not taking: Reported on 07/22/2021) 12 capsule 0   Current Facility-Administered Medications  Medication Dose Route Frequency Provider Last Rate Last Admin   0.9 %  sodium chloride infusion  500 mL Intravenous Once Imogene Burn, MD        Allergies as of 08/05/2021   (No Known Allergies)    Family History  Problem Relation Age of Onset   Colon cancer Neg Hx    Colon polyps Neg Hx    Esophageal cancer Neg Hx     Stomach cancer Neg Hx    Rectal cancer Neg Hx     Social History   Socioeconomic History   Marital status: Divorced    Spouse name: Not on file   Number of children: Not on file   Years of education: Not on file   Highest education level: Not on file  Occupational History   Not on file  Tobacco Use   Smoking status: Former   Smokeless tobacco: Never   Tobacco comments:    as of 12/02/19 has been 15 years  Vaping Use   Vaping Use: Never used  Substance and Sexual Activity   Alcohol use: No   Drug use: No   Sexual activity: Yes    Birth control/protection: Surgical  Other Topics Concern   Not on file  Social History Narrative   Works at [employer]    Daughter - 2008.   Goes to [employer] school   Social Determinants of Health   Financial Resource Strain: Not on file  Food Insecurity: Not on file  Transportation Needs: Not on file  Physical Activity: Not on file  Stress: Not on file  Social Connections: Not on file  Intimate Partner Violence: Not on file    Physical Exam: Vital signs in last 24 hours: BP 118/66    Pulse 78    Temp (!) 96.9 F (36.1 C)    Ht 5\' 3"  (1.6 m)    Wt 193 lb (87.5 kg)    SpO2 99%    BMI 34.19 kg/m  GEN: NAD EYE: Sclerae anicteric ENT: MMM CV: Non-tachycardic Pulm: No increased work of breathing GI: Soft, NT/ND NEURO:  Alert & Oriented   , MD [facility]  08/05/2021 10:24 AM

## 2021-08-05 NOTE — Op Note (Signed)
[place] Patient Name: Joanna [name] Procedure Date: 08/05/2021 10:31 AM MRN: [id number] Endoscopist: Sonny Masters "Joanna [name] ,  Age: 46 Referring MD:  Date of Birth: 1976-03-22 Gender: Female Account #: [id number] Procedure:                Colonoscopy Indications:              Screening for colorectal malignant neoplasm, This                            is the patient's first colonoscopy Medicines:                Monitored Anesthesia Care Procedure:                Pre-Anesthesia Assessment:                           - Prior to the procedure, a History and Physical                            was performed, and patient medications and                            allergies were reviewed. The patient's tolerance of                            previous anesthesia was also reviewed. The risks                            and benefits of the procedure and the sedation                            options and risks were discussed with the patient.                            All questions were answered, and informed consent                            was obtained. Prior Anticoagulants: The patient has                            taken no previous anticoagulant or antiplatelet                            agents. ASA Grade Assessment: II - A patient with                            mild systemic disease. After reviewing the risks                            and benefits, the patient was deemed in                            satisfactory condition to undergo the procedure.  After obtaining informed consent, the colonoscope                            was passed under direct vision. Throughout the                            procedure, the patient's blood pressure, pulse, and                            oxygen saturations were monitored continuously. The                            Olympus CF-HQ190L ([id number]) Colonoscope was                            introduced through the  anus and advanced to the the                            terminal ileum. The colonoscopy was performed                            without difficulty. The patient tolerated the                            procedure well. The quality of the bowel                            preparation was good. The terminal ileum, ileocecal                            valve, appendiceal orifice, and rectum were                            photographed. Scope In: 10:34:28 AM Scope Out: 10:51:25 AM Scope Withdrawal Time: 0 hours 11 minutes 32 seconds  Total Procedure Duration: 0 hours 16 minutes 57 seconds  Findings:                 The terminal ileum appeared normal.                           Non-bleeding internal hemorrhoids were found during                            retroflexion. Complications:            No immediate complications. Estimated Blood Loss:     Estimated blood loss: none. Impression:               - The examined portion of the ileum was normal.                           - Non-bleeding internal hemorrhoids.                           - No specimens collected. Recommendation:           -  Discharge patient to home (with escort).                           - Repeat colonoscopy in 10 years for screening                            purposes.                           - The findings and recommendations were discussed                            with the patient. Sonny Masters "Joanna [name]" [place],  08/05/2021 10:54:22 AM

## 2021-08-05 NOTE — Patient Instructions (Signed)
Resume previous diet and medications. Repeat Colonoscopy in 10 years for surveillance.  YOU HAD AN ENDOSCOPIC PROCEDURE TODAY AT THE [facility]:   Refer to the procedure report that was given to you for any specific questions about what was found during the examination.  If the procedure report does not answer your questions, please call your gastroenterologist to clarify.  If you requested that your care partner not be given the details of your procedure findings, then the procedure report has been included in a sealed envelope for you to review at your convenience later.  YOU SHOULD EXPECT: Some feelings of bloating in the abdomen. Passage of more gas than usual.  Walking can help get rid of the air that was put into your GI tract during the procedure and reduce the bloating. If you had a lower endoscopy (such as a colonoscopy or flexible sigmoidoscopy) you may notice spotting of blood in your stool or on the toilet paper. If you underwent a bowel prep for your procedure, you may not have a normal bowel movement for a few days.  Please Note:  You might notice some irritation and congestion in your nose or some drainage.  This is from the oxygen used during your procedure.  There is no need for concern and it should clear up in a day or so.  SYMPTOMS TO REPORT IMMEDIATELY:  Following lower endoscopy (colonoscopy or flexible sigmoidoscopy):  Excessive amounts of blood in the stool  Significant tenderness or worsening of abdominal pains  Swelling of the abdomen that is new, acute  Fever of 100F or higher  For urgent or emergent issues, a gastroenterologist can be reached at any hour by calling [phone]. Do not use MyChart messaging for urgent concerns.    DIET:  We do recommend a small meal at first, but then you may proceed to your regular diet.  Drink plenty of fluids but you should avoid alcoholic beverages for 24 hours.  ACTIVITY:  You should plan to take it easy for the  rest of today and you should NOT DRIVE or use heavy machinery until tomorrow (because of the sedation medicines used during the test).    FOLLOW UP: Our staff will call the number listed on your records 48-72 hours following your procedure to check on you and address any questions or concerns that you may have regarding the information given to you following your procedure. If we do not reach you, we will leave a message.  We will attempt to reach you two times.  During this call, we will ask if you have developed any symptoms of COVID 19. If you develop any symptoms (ie: fever, flu-like symptoms, shortness of breath, cough etc.) before then, please call [phone].  If you test positive for Covid 19 in the 2 weeks post procedure, please call and report this information to us.    If any biopsies were taken you will be contacted by phone or by letter within the next 1-3 weeks.  Please call us at [phone] if you have not heard about the biopsies in 3 weeks.    SIGNATURES/CONFIDENTIALITY: You and/or your care partner have signed paperwork which will be entered into your electronic medical record.  These signatures attest to the fact that that the information above on your After Visit Summary has been reviewed and is understood.  Full responsibility of the confidentiality of this discharge information lies with you and/or your care-partner.  

## 2021-08-05 NOTE — Progress Notes (Signed)
Dt - VS  Pt's states no medical or surgical changes since previsit or office visit.  

## 2021-08-09 ENCOUNTER — Telehealth: Payer: Self-pay

## 2021-08-09 ENCOUNTER — Telehealth: Payer: Self-pay | Admitting: *Deleted

## 2021-08-09 NOTE — Telephone Encounter (Signed)
Second attempt, mailbox is full

## 2021-08-09 NOTE — Telephone Encounter (Signed)
°  Follow up Call-  Call back number 08/05/2021  Post procedure Call Back phone  # #[phone] cell  Permission to leave phone message Yes  Some recent data might be hidden   Follow up call, unable to leave a msg as "mail box is full"

## 2021-08-15 ENCOUNTER — Other Ambulatory Visit: Payer: Self-pay | Admitting: Family Medicine

## 2021-08-17 NOTE — Progress Notes (Signed)
?Charlann Boxer D.O. ?[facility] ?[place] ?Phone: [phone] ?Subjective:   ?I, Jacqualin Combes, am serving as a scribe for Dr. Hulan Saas. ? ?This visit occurred during the SARS-CoV-2 public health emergency.  Safety protocols were in place, including screening questions prior to the visit, additional usage of staff PPE, and extensive cleaning of exam room while observing appropriate contact time as indicated for disinfecting solutions.  ?I'm seeing this patient by the request  of:  Lind Covert, MD ? ?CC: Foot pain ? ?[id number]  ?05/11/2021 ?Patient is feeling much better at this time.  No significant change in management.  We discussed trying to titrate off of the meloxicam on a regular basis and use in the morning versus formation.  Follow-up with me as needed ? ?Updated 08/18/2021 ?Joanna [name] is a 46 y.o. female coming in with complaint of right foot pain at the base of the 5th for past 2 weeks. No injury to area. Pain worse in the mornings and after she has been sitting. Pain is sharp.  ? ? ? ?  ? ?Past Medical History:  ?Diagnosis Date  ? Anemia   ? during the time she had fibroids  ? Anxiety   ? Arthritis   ? Depression   ? Heart murmur   ? born with a heart murmur, but cleared up years ago  ? Migraine   ? occasional  ? PONV (postoperative nausea and vomiting)   ? some dry heaving on waking up  ? ?Past Surgical History:  ?Procedure Laterality Date  ? ABDOMINAL HYSTERECTOMY    ? BREAST SURGERY    ? CESAREAN SECTION    ? RADIOLOGY WITH ANESTHESIA N/A 12/04/2019  ? Procedure: MRI PELVIS WITH AND WITHOUT CONTRAST,LUMBER SPINE WITHOUT CONTRAST;  Surgeon: Radiologist, Medication, MD;  Location: [facility];  Service: Radiology;  Laterality: N/A;  ? REDUCTION MAMMAPLASTY Bilateral 2001  ? ?Social History  ? ?Socioeconomic History  ? Marital status: Divorced  ?  Spouse name: Not on file  ? Number of children: Not on file  ? Years of education: Not on file  ?  Highest education level: Not on file  ?Occupational History  ? Not on file  ?Tobacco Use  ? Smoking status: Former  ? Smokeless tobacco: Never  ? Tobacco comments:  ?  as of 12/02/19 has been 15 years  ?Vaping Use  ? Vaping Use: Never used  ?Substance and Sexual Activity  ? Alcohol use: No  ? Drug use: No  ? Sexual activity: Yes  ?  Birth control/protection: Surgical  ?Other Topics Concern  ? Not on file  ?Social History Narrative  ? Works at [employer]   ? Daughter - 2008.   Goes to charter school  ? ?Social Determinants of Health  ? ?Financial Resource Strain: Not on file  ?Food Insecurity: Not on file  ?Transportation Needs: Not on file  ?Physical Activity: Not on file  ?Stress: Not on file  ?Social Connections: Not on file  ? ?No Known Allergies ?Family History  ?Problem Relation Age of Onset  ? Colon cancer Neg Hx   ? Colon polyps Neg Hx   ? Esophageal cancer Neg Hx   ? Stomach cancer Neg Hx   ? Rectal cancer Neg Hx   ? ? ? ? ? ?Current Outpatient Medications (Analgesics):  ?  meloxicam (MOBIC) 7.5 MG tablet, TAKE 1 TABLET BY MOUTH EVERY DAY ? ? ?Current  Outpatient Medications (Other):  ?  gabapentin (NEURONTIN) 300 MG capsule, Take 2 capsules (600 mg total) by mouth 3 (three) times daily as needed. ?  ondansetron (ZOFRAN-ODT) 8 MG disintegrating tablet, 8mg  ODT q4 hours prn nausea ?  phentermine 15 MG capsule, Take 15 mg by mouth every morning. ?  tiZANidine (ZANAFLEX) 4 MG tablet, TAKE 1 TABLET (4 MG TOTAL) BY MOUTH NIGHTLY FOR 10 DAYS, AS DIRECTED BY MD ?  Vilazodone HCl (VIIBRYD) 10 MG TABS, Take 1 tablet (10 mg total) by mouth daily. Take 10mg  daily for 2 weeks and then 20 mg daily there after. ?  Vitamin D, Ergocalciferol, (DRISDOL) 1.25 MG (50000 UNIT) CAPS capsule, TAKE 1 CAPSULE (50,000 UNITS TOTAL) BY MOUTH EVERY 7 (SEVEN) DAYS. ? ? ?Reviewed prior external information including notes and imaging from  ?primary care provider ?As well as notes that were available from  care everywhere and other healthcare systems. ? ?Past medical history, social, surgical and family history all reviewed in electronic medical record.  No pertanent information unless stated regarding to the chief complaint.  ? ?Review of Systems: ? No headache, visual changes, nausea, vomiting, diarrhea, constipation, dizziness, abdominal pain, skin rash, fevers, chills, night sweats, weight loss, swollen lymph nodes, body aches, joint swelling, chest pain, shortness of breath, mood changes. POSITIVE muscle aches ? ?Objective  ?Blood pressure 122/88, pulse 88, height 5\' 3"  (1.6 m), weight 196 lb (88.9 kg), SpO2 98 %. ?  ?General: No apparent distress alert and oriented x3 mood and affect normal, dressed appropriately.  ?HEENT: Pupils equal, extraocular movements intact  ?Respiratory: Patient's speak in full sentences and does not appear short of breath  ?Cardiovascular: No lower extremity edema, non tender, no erythema  ?Gait normal with good balance and coordination.  ?MSK: Right foot exam shows the patient is tender over the ATFL.  Patient does have a movable, tender mass noted on the anterior lateral aspect.  Tightness noted with FABER test also noted.  Peroneal tendons nontender. ? ?Limited muscular skeletal ultrasound was performed and interpreted by Hulan Saas, M  ?Limited musculoskeletal ultrasound of patient's ankle shows no significant hypoechoic changes.  Patient does have what appears to be a potential fatty mass of the ankle on the lateral aspect.  No abnormal blood flow.  Freely movable. ?Impression: Questionable lipoma but otherwise fairly unremarkable.  Questionable for a grade 1 sprain ?  ?Impression and Recommendations:  ?  ? ?The above documentation has been reviewed and is accurate and complete Lyndal Pulley, DO ? ? ? ?

## 2021-08-18 ENCOUNTER — Ambulatory Visit: Payer: Self-pay

## 2021-08-18 ENCOUNTER — Encounter: Payer: Self-pay | Admitting: Family Medicine

## 2021-08-18 ENCOUNTER — Ambulatory Visit: Payer: 59 | Admitting: Family Medicine

## 2021-08-18 ENCOUNTER — Other Ambulatory Visit: Payer: Self-pay

## 2021-08-18 VITALS — BP 122/88 | HR 88 | Ht 63.0 in | Wt 196.0 lb

## 2021-08-18 DIAGNOSIS — M25571 Pain in right ankle and joints of right foot: Secondary | ICD-10-CM

## 2021-08-18 DIAGNOSIS — M79671 Pain in right foot: Secondary | ICD-10-CM

## 2021-08-18 NOTE — Assessment & Plan Note (Signed)
Right ankle pain nothing specific.  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  May have a very mild sprain.  Seems to be getting improved quickly.  Discussed which activities to do which wants to avoid.  Follow-up again in 6 weeks if not completely resolved ?

## 2021-08-18 NOTE — Patient Instructions (Signed)
Ankle sprain exercises ?HOKA Recovery Sandals ?Brooks, New Balance or HOKA Bondhi ?Throw away CROCS ?See me in 6 -8 weeks ?

## 2021-08-22 ENCOUNTER — Other Ambulatory Visit (HOSPITAL_BASED_OUTPATIENT_CLINIC_OR_DEPARTMENT_OTHER): Payer: Self-pay | Admitting: Family Medicine

## 2021-08-22 DIAGNOSIS — Z1231 Encounter for screening mammogram for malignant neoplasm of breast: Secondary | ICD-10-CM

## 2021-09-05 ENCOUNTER — Other Ambulatory Visit: Payer: Self-pay

## 2021-09-05 ENCOUNTER — Telehealth: Payer: Self-pay | Admitting: *Deleted

## 2021-09-05 MED ORDER — MELOXICAM 7.5 MG PO TABS: 7.5000 mg | ORAL_TABLET | Freq: Every day | ORAL | 0 refills | Status: DC

## 2021-09-05 NOTE — Telephone Encounter (Signed)
Pt called requesting a refill for meloxicam be sent into her [facility] on [address]  ?

## 2021-09-05 NOTE — Telephone Encounter (Signed)
Rx called in 

## 2021-09-09 ENCOUNTER — Ambulatory Visit (HOSPITAL_BASED_OUTPATIENT_CLINIC_OR_DEPARTMENT_OTHER)
Admission: RE | Admit: 2021-09-09 | Discharge: 2021-09-09 | Disposition: A | Discharge status: Home / Self Care | Payer: 59 | Source: Ambulatory Visit | Attending: Family Medicine | Admitting: Family Medicine

## 2021-09-09 DIAGNOSIS — Z1231 Encounter for screening mammogram for malignant neoplasm of breast: Secondary | ICD-10-CM | POA: Insufficient documentation

## 2021-10-06 ENCOUNTER — Ambulatory Visit: Payer: 59 | Admitting: Family Medicine

## 2021-10-26 NOTE — Progress Notes (Signed)
[place] Phone: [phone] Subjective:   Joanna [name], am serving as a scribe for Dr. Hulan Saas.  This visit occurred during the SARS-CoV-2 public health emergency.  Safety protocols were in place, including screening questions prior to the visit, additional usage of staff PPE, and extensive cleaning of exam room while observing appropriate contact time as indicated for disinfecting solutions.    I'm seeing this patient by the request  of:  Lind Covert, MD  CC: Allover pain  [id number]  08/18/2021 Right ankle pain nothing specific.  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  May have a very mild sprain.  Seems to be getting improved quickly.  Discussed which activities to do which wants to avoid.  Follow-up again in 6 weeks if not completely resolved  Updated 10/27/2021 Joanna [name] is a 46 y.o. female coming in with complaint of overall pain. Pain is intermittent but felt throughout all joints. Pain worse in mornings and after sitting for prolonged periods. Feels that pai is getting worse in entire body. Using gabapentin and IBU daily. Would like meloxicam rx.        Past Medical History:  Diagnosis Date   Anemia    during the time she had fibroids   Anxiety    Arthritis    Depression    Heart murmur    born with a heart murmur, but cleared up years ago   Migraine    occasional   PONV (postoperative nausea and vomiting)    some dry heaving on waking up   Past Surgical History:  Procedure Laterality Date   ABDOMINAL HYSTERECTOMY     BREAST SURGERY     CESAREAN SECTION     RADIOLOGY WITH ANESTHESIA N/A 12/04/2019   Procedure: MRI PELVIS WITH AND WITHOUT CONTRAST,LUMBER SPINE WITHOUT CONTRAST;  Surgeon: Radiologist, Medication, MD;  Location: [facility];  Service: Radiology;  Laterality: N/A;   REDUCTION MAMMAPLASTY Bilateral 2001   Social History   Socioeconomic History   Marital status:  Divorced    Spouse name: Not on file   Number of children: Not on file   Years of education: Not on file   Highest education level: Not on file  Occupational History   Not on file  Tobacco Use   Smoking status: Former   Smokeless tobacco: Never   Tobacco comments:    as of 12/02/19 has been 15 years  Vaping Use   Vaping Use: Never used  Substance and Sexual Activity   Alcohol use: [name]   Drug use: [name]   Sexual activity: Yes    Birth control/protection: Surgical  Other Topics Concern   Not on file  Social History Narrative   Works at [employer]    Daughter - 2008.   Goes to [employer] school   Social Determinants of Health   Financial Resource Strain: Not on file  Food Insecurity: Not on file  Transportation Needs: Not on file  Physical Activity: Not on file  Stress: Not on file  Social Connections: Not on file   [name] Known Allergies Family History  Problem Relation Age of Onset   Colon cancer Neg Hx    Colon polyps Neg Hx    Esophageal cancer Neg Hx    Stomach cancer Neg Hx    Rectal cancer Neg Hx     Current Outpatient Medications (Endocrine & Metabolic):    predniSONE (DELTASONE) 20  MG tablet, Take 1 tablet (20 mg total) by mouth daily with breakfast.    Current Outpatient Medications (Analgesics):    meloxicam (MOBIC) 15 MG tablet, Take 1 tablet (15 mg total) by mouth daily.   Current Outpatient Medications (Other):    gabapentin (NEURONTIN) 300 MG capsule, Take 2 capsules (600 mg total) by mouth 3 (three) times daily as needed.   ondansetron (ZOFRAN-ODT) 8 MG disintegrating tablet, 8mg  ODT q4 hours prn nausea   phentermine 15 MG capsule, Take 15 mg by mouth every morning.   tiZANidine (ZANAFLEX) 4 MG tablet, TAKE 1 TABLET (4 MG TOTAL) BY MOUTH NIGHTLY FOR 10 DAYS, AS DIRECTED BY MD   Vilazodone HCl (VIIBRYD) 10 MG TABS, Take 1 tablet (10 mg total) by mouth daily. Take 10mg  daily for 2 weeks and then 20 mg daily there after.    Vitamin D, Ergocalciferol, (DRISDOL) 1.25 MG (50000 UNIT) CAPS capsule, TAKE 1 CAPSULE (50,000 UNITS TOTAL) BY MOUTH EVERY 7 (SEVEN) DAYS.   Reviewed prior external information including notes and imaging from  primary care provider As well as notes that were available from care everywhere and other healthcare systems.  Past medical history, social, surgical and family history all reviewed in electronic medical record.  [name] pertanent information unless stated regarding to the chief complaint.   Review of Systems:  [name] headache, visual changes, nausea, vomiting, diarrhea, constipation, dizziness, abdominal pain, skin rash, fevers, chills, night sweats, weight loss, swollen lymph nodes, body aches, joint swelling, chest pain, shortness of breath, mood changes. POSITIVE muscle aches  Objective  Blood pressure 110/84, pulse 74, height 5\' 3"  (1.6 m), weight 198 lb (89.8 kg), SpO2 98 %.   General: [name] apparent distress alert and oriented x3 mood and affect normal, dressed appropriately.  HEENT: Pupils equal, extraocular movements intact  Respiratory: Patient's speak in full sentences and does not appear short of breath  Cardiovascular: [name] lower extremity edema, non tender, [name] erythema  Gait normal with good balance and coordination.  MSK:\Patient does have swelling of multiple joints including symmetric of the patellofemoral joint, patient does have trace effusion of the ankles bilaterally.  Patient also has what appears to be tenderness to palpation over the left wrist more than the right.  Positive Finkelstein on the left wrist.  Patient does have swelling noted in the MCP joints today.    Impression and Recommendations:     The above documentation has been reviewed and is accurate and complete Lyndal Pulley, DO

## 2021-10-27 ENCOUNTER — Encounter: Payer: Self-pay | Admitting: Family Medicine

## 2021-10-27 ENCOUNTER — Ambulatory Visit (INDEPENDENT_AMBULATORY_CARE_PROVIDER_SITE_OTHER): Payer: 59 | Admitting: Family Medicine

## 2021-10-27 VITALS — BP 110/84 | HR 74 | Ht 63.0 in | Wt 198.0 lb

## 2021-10-27 DIAGNOSIS — M2550 Pain in unspecified joint: Secondary | ICD-10-CM

## 2021-10-27 DIAGNOSIS — M5459 Other low back pain: Secondary | ICD-10-CM | POA: Diagnosis not present

## 2021-10-27 DIAGNOSIS — M797 Fibromyalgia: Secondary | ICD-10-CM

## 2021-10-27 DIAGNOSIS — M654 Radial styloid tenosynovitis [de Quervain]: Secondary | ICD-10-CM | POA: Diagnosis not present

## 2021-10-27 LAB — CBC WITH DIFFERENTIAL/PLATELET
Basophils Absolute: 0 10*3/uL (ref 0.0–0.1)
Basophils Relative: 0.3 % (ref 0.0–3.0)
Eosinophils Absolute: 0.1 10*3/uL (ref 0.0–0.7)
Eosinophils Relative: 0.9 % (ref 0.0–5.0)
HCT: 38.2 % (ref 36.0–46.0)
Hemoglobin: 13.1 g/dL (ref 12.0–15.0)
Lymphocytes Relative: 44.9 % (ref 12.0–46.0)
Lymphs Abs: 2.7 10*3/uL (ref 0.7–4.0)
MCHC: 34.3 g/dL (ref 30.0–36.0)
MCV: 81.2 fl (ref 78.0–100.0)
Monocytes Absolute: 0.3 10*3/uL (ref 0.1–1.0)
Monocytes Relative: 5.4 % (ref 3.0–12.0)
Neutro Abs: 2.9 10*3/uL (ref 1.4–7.7)
Neutrophils Relative %: 48.5 % (ref 43.0–77.0)
Platelets: 345 10*3/uL (ref 150.0–400.0)
RBC: 4.71 Mil/uL (ref 3.87–5.11)
RDW: 13.3 % (ref 11.5–15.5)
WBC: 5.9 10*3/uL (ref 4.0–10.5)

## 2021-10-27 LAB — URIC ACID: Uric Acid, Serum: 5.3 mg/dL (ref 2.4–7.0)

## 2021-10-27 LAB — VITAMIN D 25 HYDROXY (VIT D DEFICIENCY, FRACTURES): VITD: 11.24 ng/mL — ABNORMAL LOW (ref 30.00–100.00)

## 2021-10-27 LAB — IBC PANEL
Iron: 69 ug/dL (ref 42–145)
Saturation Ratios: 20.5 % (ref 20.0–50.0)
TIBC: 336 ug/dL (ref 250.0–450.0)
Transferrin: 240 mg/dL (ref 212.0–360.0)

## 2021-10-27 LAB — SEDIMENTATION RATE: Sed Rate: 5 mm/hr (ref 0–20)

## 2021-10-27 LAB — FERRITIN: Ferritin: 44.9 ng/mL (ref 10.0–291.0)

## 2021-10-27 LAB — COMPREHENSIVE METABOLIC PANEL
ALT: 16 U/L (ref 0–35)
AST: 17 U/L (ref 0–37)
Albumin: 3.9 g/dL (ref 3.5–5.2)
Alkaline Phosphatase: 59 U/L (ref 39–117)
BUN: 11 mg/dL (ref 6–23)
CO2: 30 mEq/L (ref 19–32)
Calcium: 9.1 mg/dL (ref 8.4–10.5)
Chloride: 105 mEq/L (ref 96–112)
Creatinine, Ser: 0.83 mg/dL (ref 0.40–1.20)
GFR: 83.87 mL/min (ref >60.00)
Glucose, Bld: 99 mg/dL (ref 70–99)
Potassium: 3.8 mEq/L (ref 3.5–5.1)
Sodium: 140 mEq/L (ref 135–145)
Total Bilirubin: 0.4 mg/dL (ref 0.2–1.2)
Total Protein: 6.6 g/dL (ref 6.0–8.3)

## 2021-10-27 LAB — VITAMIN B12: Vitamin B-12: 305 pg/mL (ref 211–911)

## 2021-10-27 LAB — TSH: TSH: 1.05 u[IU]/mL (ref 0.35–5.50)

## 2021-10-27 LAB — LUTEINIZING HORMONE: LH: 18.06 m[IU]/mL

## 2021-10-27 LAB — TESTOSTERONE: Testosterone: 23.06 ng/dL (ref 15.00–40.00)

## 2021-10-27 LAB — FOLLICLE STIMULATING HORMONE: FSH: 46.2 m[IU]/mL

## 2021-10-27 LAB — C-REACTIVE PROTEIN: CRP: <1 mg/dL (ref 0.5–20.0)

## 2021-10-27 MED ORDER — METHYLPREDNISOLONE ACETATE 80 MG/ML IJ SUSP
80.0000 mg | Freq: Once | INTRAMUSCULAR | Status: AC
  Administered 2021-10-27: 80 mg via INTRAMUSCULAR

## 2021-10-27 MED ORDER — KETOROLAC TROMETHAMINE 60 MG/2ML IM SOLN
60.0000 mg | Freq: Once | INTRAMUSCULAR | Status: AC
  Administered 2021-10-27: 60 mg via INTRAMUSCULAR

## 2021-10-27 MED ORDER — MELOXICAM 15 MG PO TABS: 15.0000 mg | ORAL_TABLET | Freq: Every day | ORAL | 0 refills | Status: DC

## 2021-10-27 MED ORDER — PREDNISONE 20 MG PO TABS: 20.0000 mg | ORAL_TABLET | Freq: Every day | ORAL | 0 refills | Status: DC

## 2021-10-27 NOTE — Patient Instructions (Addendum)
Injections in backside today Labs today Prednisone 20mg  for 5 days then meloxicam 15mg  for 5 days then as needed See me again in 5-6 weeks

## 2021-10-27 NOTE — Assessment & Plan Note (Signed)
Patient does have what appears to be more of a fibromyalgia versus chronic pain and could be having exacerbation.  Toradol and Depo-Medrol given today, patient given a short course of prednisone as well as will do meloxicam again.  We will see how patient responds.  Discussed with patient icing regimen and home exercises, which activities to do and which ones to avoid.  Increase activity.  Zanaflex for breakthrough.  Continue the gabapentin.  Depending on laboratory work-up we will discuss other treatment options.  Follow-up again in 6 to 8 weeks

## 2021-10-27 NOTE — Assessment & Plan Note (Signed)
Thumb spica splint given today, discussed icing regimen, meloxicam and home exercises.  Follow-up again in 6 to 8 weeks worsening pain consider injection

## 2021-10-28 ENCOUNTER — Other Ambulatory Visit: Payer: Self-pay | Admitting: Family Medicine

## 2021-10-28 MED ORDER — VITAMIN D (ERGOCALCIFEROL) 1.25 MG (50000 UNIT) PO CAPS: 50000.0000 [IU] | ORAL_CAPSULE | ORAL | 0 refills | Status: DC

## 2021-10-31 ENCOUNTER — Other Ambulatory Visit: Payer: Self-pay | Admitting: Family Medicine

## 2021-10-31 DIAGNOSIS — R768 Other specified abnormal immunological findings in serum: Secondary | ICD-10-CM

## 2021-10-31 LAB — ANTI-NUCLEAR AB-TITER (ANA TITER): ANA Titer 1: 1:40 {titer} — ABNORMAL HIGH

## 2021-10-31 LAB — CYCLIC CITRUL PEPTIDE ANTIBODY, IGG: Cyclic Citrullin Peptide Ab: <16 UNITS

## 2021-10-31 LAB — ANGIOTENSIN CONVERTING ENZYME: Angiotensin-Converting Enzyme: 41 U/L (ref 9–67)

## 2021-10-31 LAB — PTH, INTACT AND CALCIUM
Calcium: 9 mg/dL (ref 8.6–10.2)
PTH: 46 pg/mL (ref 16–77)

## 2021-10-31 LAB — CALCIUM, IONIZED: Calcium, Ion: 4.9 mg/dL (ref 4.7–5.5)

## 2021-10-31 LAB — ANA: Anti Nuclear Antibody (ANA): POSITIVE — AB

## 2021-10-31 LAB — RHEUMATOID FACTOR: Rheumatoid fact SerPl-aCnc: <14 IU/mL (ref <14)

## 2021-11-02 ENCOUNTER — Other Ambulatory Visit: Payer: Self-pay | Admitting: Family Medicine

## 2021-11-02 DIAGNOSIS — R768 Other specified abnormal immunological findings in serum: Secondary | ICD-10-CM

## 2021-11-23 ENCOUNTER — Other Ambulatory Visit: Payer: Self-pay | Admitting: Family Medicine

## 2021-12-06 ENCOUNTER — Other Ambulatory Visit: Payer: Self-pay

## 2021-12-06 DIAGNOSIS — R768 Other specified abnormal immunological findings in serum: Secondary | ICD-10-CM

## 2021-12-08 NOTE — Progress Notes (Signed)
[name] [address] [place] [address] Phone: [phone] Subjective:   [name], am serving as a scribe for Dr. Antoine Primas.   I'm seeing this patient by the request  of:  Carney Living, MD  CC: Back pain and allover pain follow-up  [id number]  10/27/2021 Thumb spica splint given today, discussed icing regimen, meloxicam and home exercises.  Follow-up again in 6 to 8 weeks worsening pain consider injection  Patient does have what appears to be more of a fibromyalgia versus chronic pain and could be having exacerbation.  Toradol and Depo-Medrol given today, patient given a short course of prednisone as well as will do meloxicam again.  We will see how patient responds.  Discussed with patient icing regimen and home exercises, which activities to do and which ones to avoid.  Increase activity.  Zanaflex for breakthrough.  Continue the gabapentin.  Depending on laboratory work-up we will discuss other treatment options.  Follow-up again in 6 to 8 weeks  Updated 12/09/2021 SULEIMA OHLENDORF is a 46 y.o. female coming in with complaint of polyarthralgia and L wrist pain. Patient is doing much better than last visit. Feels that prednisone made a big difference. Wrist is also better with use of brace.        Past Medical History:  Diagnosis Date   Anemia    during the time she had fibroids   Anxiety    Arthritis    Depression    Heart murmur    born with a heart murmur, but cleared up years ago   Migraine    occasional   PONV (postoperative nausea and vomiting)    some dry heaving on waking up   Past Surgical History:  Procedure Laterality Date   ABDOMINAL HYSTERECTOMY     BREAST SURGERY     CESAREAN SECTION     RADIOLOGY WITH ANESTHESIA N/A 12/04/2019   Procedure: MRI PELVIS WITH AND WITHOUT CONTRAST,LUMBER SPINE WITHOUT CONTRAST;  Surgeon: Radiologist, Medication, MD;  Location: [facility];  Service: Radiology;   Laterality: N/A;   REDUCTION MAMMAPLASTY Bilateral 2001   Social History   Socioeconomic History   Marital status: Divorced    Spouse name: Not on file   Number of children: Not on file   Years of education: Not on file   Highest education level: Not on file  Occupational History   Not on file  Tobacco Use   Smoking status: Former   Smokeless tobacco: Never   Tobacco comments:    as of 12/02/19 has been 15 years  Vaping Use   Vaping Use: Never used  Substance and Sexual Activity   Alcohol use: No   Drug use: No   Sexual activity: Yes    Birth control/protection: Surgical  Other Topics Concern   Not on file  Social History Narrative   Works at [employer]    Daughter - 2008.   Goes to [employer] school   Social Determinants of Health   Financial Resource Strain: Not on file  Food Insecurity: Not on file  Transportation Needs: Not on file  Physical Activity: Not on file  Stress: Not on file  Social Connections: Not on file   No Known Allergies Family History  Problem Relation Age of Onset   Colon cancer Neg Hx    Colon polyps Neg Hx    Esophageal cancer Neg Hx    Stomach cancer Neg Hx  Rectal cancer Neg Hx     Current Outpatient Medications (Endocrine & Metabolic):    predniSONE (DELTASONE) 20 MG tablet, Take 1 tablet (20 mg total) by mouth daily with breakfast.    Current Outpatient Medications (Analgesics):    meloxicam (MOBIC) 15 MG tablet, Take 1 tablet (15 mg total) by mouth daily.   Current Outpatient Medications (Other):    gabapentin (NEURONTIN) 300 MG capsule, Take 2 capsules (600 mg total) by mouth 3 (three) times daily as needed.   ondansetron (ZOFRAN-ODT) 8 MG disintegrating tablet, 8mg  ODT q4 hours prn nausea   phentermine 15 MG capsule, Take 15 mg by mouth every morning.   tiZANidine (ZANAFLEX) 4 MG tablet, TAKE 1 TABLET (4 MG TOTAL) BY MOUTH NIGHTLY FOR 10 DAYS, AS DIRECTED BY MD   Vilazodone HCl (VIIBRYD)  10 MG TABS, Take 1 tablet (10 mg total) by mouth daily. Take 10mg  daily for 2 weeks and then 20 mg daily there after.   Vitamin D, Ergocalciferol, (DRISDOL) 1.25 MG (50000 UNIT) CAPS capsule, Take 1 capsule (50,000 Units total) by mouth every 7 (seven) days.   Reviewed prior external information including notes and imaging from  primary care provider As well as notes that were available from care everywhere and other healthcare systems.  Past medical history, social, surgical and family history all reviewed in electronic medical record.  No pertanent information unless stated regarding to the chief complaint.   Review of Systems:  No headache, visual changes, nausea, vomiting, diarrhea, constipation, dizziness, abdominal pain, skin rash, fevers, chills, night sweats, weight loss, swollen lymph nodes, body aches, joint swelling, chest pain, shortness of breath, mood changes. POSITIVE muscle aches  Objective  Blood pressure 124/86, pulse 100, height 5\' 3"  (1.6 m), weight 198 lb (89.8 kg), SpO2 97 %.   General: No apparent distress alert and oriented x3 mood and affect normal, dressed appropriately.  HEENT: Pupils equal, extraocular movements intact  Respiratory: Patient's speak in full sentences and does not appear short of breath  Cardiovascular: No lower extremity edema, non tender, no erythema  Patient does have tenderness noted overall.  Overall no significant improvement noted.  Still though more uncomfortable than would be anticipated.  Limited muscular skeletal ultrasound was performed and interpreted by , M  Limited ultrasound does not show any significant hypoechoic changes of the joints anymore at this moment.    Impression and Recommendations:     The above documentation has been reviewed and is accurate and complete , DO

## 2021-12-09 ENCOUNTER — Ambulatory Visit: Payer: Self-pay

## 2021-12-09 ENCOUNTER — Encounter: Payer: Self-pay | Admitting: Family Medicine

## 2021-12-09 ENCOUNTER — Ambulatory Visit (INDEPENDENT_AMBULATORY_CARE_PROVIDER_SITE_OTHER): Payer: 59 | Admitting: Family Medicine

## 2021-12-09 VITALS — BP 124/86 | HR 100 | Ht 63.0 in | Wt 198.0 lb

## 2021-12-09 DIAGNOSIS — M654 Radial styloid tenosynovitis [de Quervain]: Secondary | ICD-10-CM | POA: Diagnosis not present

## 2021-12-09 DIAGNOSIS — M797 Fibromyalgia: Secondary | ICD-10-CM | POA: Diagnosis not present

## 2021-12-09 DIAGNOSIS — M25532 Pain in left wrist: Secondary | ICD-10-CM

## 2021-12-09 MED ORDER — MELOXICAM 15 MG PO TABS: 15.0000 mg | ORAL_TABLET | Freq: Every day | ORAL | 0 refills | Status: DC

## 2021-12-09 NOTE — Patient Instructions (Signed)
Meloxicam refilled Take in 3-5 day bursts when needed Continue Vit D See me in 3 months might consider repeat labs for Vit D

## 2021-12-09 NOTE — Assessment & Plan Note (Signed)
Improved overall..  Meloxicam for breakthrough.  Worsening pain consider injection follow-up in 3 months

## 2021-12-09 NOTE — Assessment & Plan Note (Signed)
Patient does have some fibromyalgia but has responded extremely well to the prednisone.  I do think that the low vitamin D could be contributing as well to some of the other difficulties.  Discussed icing regimen and home exercises otherwise.  Follow-up again in 12 weeks.

## 2021-12-16 ENCOUNTER — Other Ambulatory Visit: Payer: Self-pay | Admitting: Family Medicine

## 2021-12-16 DIAGNOSIS — G609 Hereditary and idiopathic neuropathy, unspecified: Secondary | ICD-10-CM

## 2021-12-26 NOTE — Progress Notes (Unsigned)
    SUBJECTIVE:   CHIEF COMPLAINT / HPI:   Obesity Has tried Bariatrics with injections and phetermine but was very expensive.  Tried diets and exercise intermittently but none now. May plan to exercise with her daughter.  Wonders about medications or other weight control approaches  PERTINENT  PMH / PSH: reviewed updated her medication list  OBJECTIVE:   BP 129/77   Pulse 76   Wt 198 lb 9.6 oz (90.1 kg)   SpO2 99%   BMI 35.18 kg/m   Heart - Regular rate and rhythm.  No murmurs, gallops or rubs.    Lungs:  Normal respiratory effort, chest expands symmetrically. Lungs are clear to auscultation, no crackles or wheezes. Neck:  No deformities, thyromegaly, masses, or tenderness noted.   Supple with full range of motion without pain. Mobility:able to get up and down from exam table without assistance or distress   ASSESSMENT/PLAN:   Obesity Worsened.  She has tried bariatric clinic with injections but did very expensive.  Would like to see if insurance will pay for [facility] so I will send in Rx.  Also referral to [facility],  Encouraged to regular exercise and diet - she may do this with her daughter   Patient Instructions  Good to see you today - Thank you for coming in  Things we discussed today:  I will prescribe Wegovy and see if your insurance will pay for this and what the price might be  [email or web address]  I have put in a referral to [facility].  They should call you to schedule an appointment.  This could appear as an unknown number on your phone.  If you have not heard from them in 2 weeks please let me know.   Come back to see me in 12 months    Carney Living, MD [facility]

## 2021-12-27 ENCOUNTER — Other Ambulatory Visit: Payer: Self-pay

## 2021-12-27 ENCOUNTER — Ambulatory Visit (INDEPENDENT_AMBULATORY_CARE_PROVIDER_SITE_OTHER): Payer: 59 | Admitting: Family Medicine

## 2021-12-27 ENCOUNTER — Encounter: Payer: Self-pay | Admitting: Family Medicine

## 2021-12-27 DIAGNOSIS — E6609 Other obesity due to excess calories: Secondary | ICD-10-CM

## 2021-12-27 DIAGNOSIS — Z6835 Body mass index (BMI) 35.0-35.9, adult: Secondary | ICD-10-CM

## 2021-12-27 DIAGNOSIS — E669 Obesity, unspecified: Secondary | ICD-10-CM | POA: Insufficient documentation

## 2021-12-27 MED ORDER — WEGOVY 0.25 MG/0.5ML ~~LOC~~ SOAJ: 0.2500 mg | SUBCUTANEOUS | 1 refills | Status: DC

## 2021-12-27 NOTE — Assessment & Plan Note (Signed)
Worsened.  She has tried bariatric clinic with injections but did very expensive.  Would like to see if insurance will pay for [facility] so I will send in Rx.  Also referral to [facility],  Encouraged to regular exercise and diet - she may do this with her daughter

## 2021-12-27 NOTE — Patient Instructions (Signed)
Good to see you today - Thank you for coming in  Things we discussed today:  I will prescribe Wegovy and see if your insurance will pay for this and what the price might be  [email or web address]  I have put in a referral to [facility].  They should call you to schedule an appointment.  This could appear as an unknown number on your phone.  If you have not heard from them in 2 weeks please let me know.   Come back to see me in 12 months

## 2022-01-10 ENCOUNTER — Other Ambulatory Visit: Payer: Self-pay | Admitting: Family Medicine

## 2022-01-10 DIAGNOSIS — G609 Hereditary and idiopathic neuropathy, unspecified: Secondary | ICD-10-CM

## 2022-02-06 ENCOUNTER — Other Ambulatory Visit: Payer: Self-pay | Admitting: Family Medicine

## 2022-02-10 ENCOUNTER — Other Ambulatory Visit: Payer: Self-pay | Admitting: Family Medicine

## 2022-03-08 NOTE — Progress Notes (Unsigned)
[name] [address] [place] [address] Phone: [phone] Subjective:   Joanna [name], am serving as a scribe for Dr. Antoine Primas.  I'm seeing this patient by the request  of:  Carney Living, MD  CC: Left wrist pain  [id number]  12/09/2021 Improved overall..  Meloxicam for breakthrough.  Worsening pain consider injection follow-up in 3 months  Patient does have some fibromyalgia but has responded extremely well to the prednisone.  I do think that the low vitamin D could be contributing as well to some of the other difficulties.  Discussed icing regimen and home exercises otherwise.  Follow-up again in 12 weeks.  Updated 03/09/2022 Joanna [name] is a 46 y.o. female coming in with complaint of L wrist pain, patient has had de Quervain's tenosynovitis previously.  Multiple other muscular complaints over the course of time.  Patient states wrist is getting better. Left ankle hurts. Twisted it recently. Also has overall inflammation she would like to talk about.       Past Medical History:  Diagnosis Date   Anemia    during the time she had fibroids   Anxiety    Arthritis    Depression    Heart murmur    born with a heart murmur, but cleared up years ago   Migraine    occasional   PONV (postoperative nausea and vomiting)    some dry heaving on waking up   Past Surgical History:  Procedure Laterality Date   ABDOMINAL HYSTERECTOMY     BREAST SURGERY     CESAREAN SECTION     RADIOLOGY WITH ANESTHESIA N/A 12/04/2019   Procedure: MRI PELVIS WITH AND WITHOUT CONTRAST,LUMBER SPINE WITHOUT CONTRAST;  Surgeon: Radiologist, Medication, MD;  Location: [facility] OR;  Service: Radiology;  Laterality: N/A;   REDUCTION MAMMAPLASTY Bilateral 2001   Social History   Socioeconomic History   Marital status: Divorced    Spouse name: Not on file   Number of children: Not on file   Years of education: Not on file   Highest education level:  Not on file  Occupational History   Not on file  Tobacco Use   Smoking status: Former   Smokeless tobacco: Never   Tobacco comments:    as of 12/02/19 has been 15 years  Vaping Use   Vaping Use: Never used  Substance and Sexual Activity   Alcohol use: No   Drug use: No   Sexual activity: Yes    Birth control/protection: Surgical  Other Topics Concern   Not on file  Social History Narrative   Works at [employer]    Daughter - 2008.   Goes to charter school   Social Determinants of Corporate investment banker Strain: Not on file  Food Insecurity: Not on file  Transportation Needs: Not on file  Physical Activity: Not on file  Stress: Not on file  Social Connections: Not on file   No Known Allergies Family History  Problem Relation Age of Onset   Colon cancer Neg Hx    Colon polyps Neg Hx    Esophageal cancer Neg Hx    Stomach cancer Neg Hx    Rectal cancer Neg Hx     Current Outpatient Medications (Endocrine & Metabolic):    predniSONE (DELTASONE) 20 MG tablet, Take 2 tablets (40 mg total) by mouth daily with breakfast.    Current Outpatient Medications (Analgesics):  meloxicam (MOBIC) 15 MG tablet, TAKE 1 TABLET (15 MG TOTAL) BY MOUTH DAILY.   Current Outpatient Medications (Other):    gabapentin (NEURONTIN) 300 MG capsule, TAKE 1-2 CAPSULES UP TO THREE TIMES A DAY AS NEEDED   ondansetron (ZOFRAN-ODT) 8 MG disintegrating tablet, 8mg  ODT q4 hours prn nausea   Semaglutide-Weight Management (WEGOVY) 0.25 MG/0.5ML SOAJ, Inject 0.25 mg into the skin once a week.   tiZANidine (ZANAFLEX) 4 MG tablet, TAKE 1 TABLET (4 MG TOTAL) BY MOUTH NIGHTLY FOR 10 DAYS, AS DIRECTED BY MD   Vitamin D, Ergocalciferol, (DRISDOL) 1.25 MG (50000 UNIT) CAPS capsule, Take 1 capsule (50,000 Units total) by mouth every 7 (seven) days.   Reviewed prior external information including notes and imaging from  primary care provider As well as notes that were  available from care everywhere and other healthcare systems.  Past medical history, social, surgical and family history all reviewed in electronic medical record.  No pertanent information unless stated regarding to the chief complaint.   Review of Systems:  No headache, visual changes, nausea, vomiting, diarrhea, constipation, dizziness, abdominal pain, skin rash, fevers, chills, night sweats, weight loss, swollen lymph nodes,  chest pain, shortness of breath, mood changes. POSITIVE muscle aches, body aches, joint swelling  Objective  Blood pressure 122/76, pulse 90, height 5\' 3"  (1.6 m), weight 201 lb (91.2 kg), SpO2 93 %.   General: No apparent distress alert and oriented x3 mood and affect normal, dressed appropriately.  HEENT: Pupils equal, extraocular movements intact  Respiratory: Patient's speak in full sentences and does not appear short of breath  Cardiovascular: No lower extremity edema, non tender, no erythema  Left ankle exam does show some mild swelling on the lateral aspect of the ankle.  Tender to palpation more over the dorsal aspect of the foot than the ankle itself.  5 out of 5 strength noted.  No pain specifically over the fifth metatarsal but just proximal and medial to this area.   Limited muscular skeletal ultrasound was performed and interpreted by Hulan Saas, M  Limited ultrasound of patient's ankle shows that there is a trace effusion noted over the ATFL.  Patient noted does have what appears to be hypoechoic changes consistent with possible tendinitis within the tendon sheath of the peroneal tendons just proximal to the insertion on the fifth metatarsal. Impression: Peroneal tendon sheath    Impression and Recommendations:    The above documentation has been reviewed and is accurate and complete Lyndal Pulley, DO

## 2022-03-09 ENCOUNTER — Ambulatory Visit: Payer: Self-pay

## 2022-03-09 ENCOUNTER — Encounter: Payer: Self-pay | Admitting: Family Medicine

## 2022-03-09 ENCOUNTER — Ambulatory Visit (INDEPENDENT_AMBULATORY_CARE_PROVIDER_SITE_OTHER): Payer: 59 | Admitting: Family Medicine

## 2022-03-09 VITALS — BP 122/76 | HR 90 | Ht 63.0 in | Wt 201.0 lb

## 2022-03-09 DIAGNOSIS — M797 Fibromyalgia: Secondary | ICD-10-CM | POA: Diagnosis not present

## 2022-03-09 DIAGNOSIS — M7672 Peroneal tendinitis, left leg: Secondary | ICD-10-CM

## 2022-03-09 DIAGNOSIS — M25532 Pain in left wrist: Secondary | ICD-10-CM

## 2022-03-09 MED ORDER — PREDNISONE 20 MG PO TABS: 40.0000 mg | ORAL_TABLET | Freq: Every day | ORAL | 0 refills | Status: DC

## 2022-03-09 MED ORDER — METHYLPREDNISOLONE ACETATE 80 MG/ML IJ SUSP
80.0000 mg | Freq: Once | INTRAMUSCULAR | Status: AC
  Administered 2022-03-09: 80 mg via INTRAMUSCULAR

## 2022-03-09 MED ORDER — KETOROLAC TROMETHAMINE 60 MG/2ML IM SOLN
60.0000 mg | Freq: Once | INTRAMUSCULAR | Status: AC
  Administered 2022-03-09: 60 mg via INTRAMUSCULAR

## 2022-03-09 MED ORDER — VITAMIN D (ERGOCALCIFEROL) 1.25 MG (50000 UNIT) PO CAPS
50000.0000 [IU] | ORAL_CAPSULE | ORAL | 0 refills | Status: DC
Start: 2022-03-09 — End: 2022-05-26

## 2022-03-09 NOTE — Patient Instructions (Addendum)
Once weekly Vit D prescribed Good to see you! Prednisone 40mg  5 days 1/16-1/8 inch heel lifts Do prescribed exercises at least 3x a week

## 2022-03-09 NOTE — Assessment & Plan Note (Signed)
Continues to have fibromyalgia-like symptoms.  Has been taking the meloxicam relatively regularly.  We will hold on that and given Toradol and Depo-Medrol.  Patient will take a short course of prednisone.  Encouraged her to continue to work on the weight loss.  Discussed icing regimen.  If continuing to have pain can consider the possibility of Effexor or duloxetine.  Follow-up again in 6 to 8 weeks

## 2022-03-09 NOTE — Assessment & Plan Note (Signed)
I do believe the patient actually has a new injury to the peroneal tendons but more at the connection over the fifth metatarsal area.  No cortical irregularity noted.  Discussed icing regimen and home exercises, discussed which activities to do which ones to avoid.  Increase activity slowly.  Discussed heel lift.  Follow-up again in 6 to 8 weeks.

## 2022-03-11 ENCOUNTER — Other Ambulatory Visit: Payer: Self-pay | Admitting: Family Medicine

## 2022-03-14 ENCOUNTER — Other Ambulatory Visit: Payer: Self-pay | Admitting: Family Medicine

## 2022-03-28 ENCOUNTER — Telehealth: Payer: Self-pay

## 2022-03-28 ENCOUNTER — Other Ambulatory Visit: Payer: Self-pay

## 2022-03-28 DIAGNOSIS — G609 Hereditary and idiopathic neuropathy, unspecified: Secondary | ICD-10-CM

## 2022-03-28 MED ORDER — GABAPENTIN 300 MG PO CAPS: ORAL_CAPSULE | ORAL | 1 refills | Status: DC

## 2022-03-28 NOTE — Telephone Encounter (Signed)
Patient called stating that her hand is hurting pretty badly that it is waking her up at night. She is wanting to know if she can get some pain medication sent to her pharmacy. [facility] [address]

## 2022-03-28 NOTE — Telephone Encounter (Signed)
New Rx called in for patient. Patient notified.

## 2022-04-04 ENCOUNTER — Other Ambulatory Visit: Payer: Self-pay | Admitting: Family Medicine

## 2022-04-09 ENCOUNTER — Ambulatory Visit
Admission: EM | Admit: 2022-04-09 | Discharge: 2022-04-09 | Disposition: A | Discharge status: Home / Self Care | Payer: 59 | Attending: Emergency Medicine | Admitting: Emergency Medicine

## 2022-04-09 DIAGNOSIS — J029 Acute pharyngitis, unspecified: Secondary | ICD-10-CM | POA: Diagnosis not present

## 2022-04-09 DIAGNOSIS — B349 Viral infection, unspecified: Secondary | ICD-10-CM

## 2022-04-09 DIAGNOSIS — Z1152 Encounter for screening for COVID-19: Secondary | ICD-10-CM | POA: Diagnosis not present

## 2022-04-09 LAB — POCT RAPID STREP A (OFFICE): Rapid Strep A Screen: NEGATIVE

## 2022-04-09 MED ORDER — IPRATROPIUM BROMIDE 0.06 % NA SOLN: 2.0000 | Freq: Three times a day (TID) | NASAL | 1 refills | Status: DC

## 2022-04-09 NOTE — ED Triage Notes (Signed)
Pt presents with sore throat X 2 days. 

## 2022-04-09 NOTE — ED Provider Notes (Signed)
[facility]    CSN: [id number] Arrival date & time: 04/09/22  0808    HISTORY   Chief Complaint  Patient presents with   Sore Throat   HPI Joanna [name] is a pleasant, 46 y.o. female who presents to urgent care today. Pt presents with sore throat X 2 days.  Patient states she has not had any loss of taste or smell, fever, body aches, chills, nausea, vomiting, diarrhea, cough, rash.  Patient states she has had no sick contacts at home however her boss was sick around her early last week.  Patient states she has mild pain with swallowing and that her sore throat is mostly just scratchy.  Patient is requesting testing for strep pharyngitis today.  Patient has normal vital signs on arrival today.    Past Medical History:  Diagnosis Date   Anemia    during the time she had fibroids   Anxiety    Arthritis    Depression    Heart murmur    born with a heart murmur, but cleared up years ago   Migraine    occasional   PONV (postoperative nausea and vomiting)    some dry heaving on waking up   Patient Active Problem List   Diagnosis Date Noted   Obesity 12/27/2021   De Quervain's tenosynovitis, left 10/27/2021   Right ankle pain 08/18/2021   Cervical disc disorder with radiculopathy of cervical region 01/26/2021   Nonallopathic lesion of sacral region 08/15/2019   Nonallopathic lesion of lumbar region 08/15/2019   Nonallopathic lesion of thoracic region 08/15/2019   Left lumbar radiculopathy 07/21/2019   Hereditary and idiopathic peripheral neuropathy 05/13/2019   Partial nontraumatic rupture of right rotator cuff 07/24/2018   Fracture of base of fifth metatarsal bone of left foot at metaphyseal-diaphyseal junction, closed, initial encounter 01/22/2018   Peroneal tendinitis of left lower extremity 12/21/2017   Fibromyalgia 08/23/2016   Bilateral low back pain 11/21/2012   MENOPAUSE, SURGICAL 05/11/2010   SICKLE CELL TRAIT 08/09/2006   MIGRAINE, UNSPEC., W/O  INTRACTABLE MIGRAINE 08/09/2006   Past Surgical History:  Procedure Laterality Date   ABDOMINAL HYSTERECTOMY     BREAST SURGERY     CESAREAN SECTION     RADIOLOGY WITH ANESTHESIA N/A 12/04/2019   Procedure: MRI PELVIS WITH AND WITHOUT CONTRAST,LUMBER SPINE WITHOUT CONTRAST;  Surgeon: Radiologist, Medication, MD;  Location: [facility] OR;  Service: Radiology;  Laterality: N/A;   REDUCTION MAMMAPLASTY Bilateral 2001   OB History   No obstetric history on file.    Home Medications    Prior to Admission medications   Medication Sig Start Date End Date Taking? Authorizing Provider  gabapentin (NEURONTIN) 300 MG capsule TAKE 3 CAPSULES UP TO THREE TIMES A DAY AS NEEDED 03/28/22   Judi SaaSmith, Zachary M, DO  meloxicam (MOBIC) 15 MG tablet TAKE 1 TABLET (15 MG TOTAL) BY MOUTH DAILY. 04/04/22   Judi SaaSmith, Zachary M, DO  ondansetron (ZOFRAN-ODT) 8 MG disintegrating tablet 8mg  ODT q4 hours prn nausea 06/26/21   Geoffery Lyonselo, Douglas, MD  predniSONE (DELTASONE) 20 MG tablet Take 2 tablets (40 mg total) by mouth daily with breakfast. 03/09/22   Judi SaaSmith, Zachary M, DO  Semaglutide-Weight Management (WEGOVY) 0.25 MG/0.5ML SOAJ Inject 0.25 mg into the skin once a week. 12/27/21   Carney Livinghambliss, Marshall L, MD  tiZANidine (ZANAFLEX) 4 MG tablet TAKE 1 TABLET (4 MG TOTAL) BY MOUTH NIGHTLY FOR 10 DAYS, AS DIRECTED BY MD 03/14/22   Judi SaaSmith, Zachary M, DO  Vitamin D,  Ergocalciferol, (DRISDOL) 1.25 MG (50000 UNIT) CAPS capsule Take 1 capsule (50,000 Units total) by mouth every 7 (seven) days. 03/09/22   Judi Saa, DO    Family History Family History  Problem Relation Age of Onset   Colon cancer Neg Hx    Colon polyps Neg Hx    Esophageal cancer Neg Hx    Stomach cancer Neg Hx    Rectal cancer Neg Hx    Social History Social History   Tobacco Use   Smoking status: Former   Smokeless tobacco: Never   Tobacco comments:    as of 12/02/19 has been 15 years  Vaping Use   Vaping Use: Never used  Substance Use Topics   Alcohol use:  No   Drug use: No   Allergies   Patient has no known allergies.  Review of Systems Review of Systems Pertinent findings revealed after performing a 14 point review of systems has been noted in the history of present illness.  Physical Exam Triage Vital Signs ED Triage Vitals  Enc Vitals Group     BP 04/08/21 0827 (!) 147/82     Pulse Rate 04/08/21 0827 72     Resp 04/08/21 0827 18     Temp 04/08/21 0827 98.3 F (36.8 C)     Temp Source 04/08/21 0827 Oral     SpO2 04/08/21 0827 98 %     Weight --      Height --      Head Circumference --      Peak Flow --      Pain Score 04/08/21 0826 5     Pain Loc --      Pain Edu? --      Excl. in GC? --   No data found.  Updated Vital Signs BP 100/65 (BP Location: Left Arm)   Pulse 74   Temp 98 F (36.7 C) (Oral)   Resp 17   SpO2 96%   Physical Exam Vitals and nursing note reviewed.  Constitutional:      General: She is not in acute distress.    Appearance: Normal appearance. She is not ill-appearing.  HENT:     Head: Normocephalic and atraumatic.     Salivary Glands: Right salivary gland is not diffusely enlarged or tender. Left salivary gland is not diffusely enlarged or tender.     Right Ear: Tympanic membrane, ear canal and external ear normal. No drainage. No middle ear effusion. There is no impacted cerumen. Tympanic membrane is not erythematous or bulging.     Left Ear: Tympanic membrane, ear canal and external ear normal. No drainage.  No middle ear effusion. There is no impacted cerumen. Tympanic membrane is not erythematous or bulging.     Nose: Rhinorrhea present. No nasal deformity, septal deviation, mucosal edema or congestion. Rhinorrhea is clear.     Right Turbinates: Enlarged. Not swollen or pale.     Left Turbinates: Enlarged. Not swollen or pale.     Right Sinus: No maxillary sinus tenderness or frontal sinus tenderness.     Left Sinus: No maxillary sinus tenderness or frontal sinus tenderness.      Mouth/Throat:     Lips: Pink. No lesions.     Mouth: Mucous membranes are moist. No oral lesions.     Pharynx: Oropharynx is clear. Uvula midline. Posterior oropharyngeal erythema present. No pharyngeal swelling, oropharyngeal exudate or uvula swelling.     Tonsils: No tonsillar exudate. 0 on the right. 0 on the  left.     Comments: Postnasal drip Eyes:     General: Lids are normal.        Right eye: No discharge.        Left eye: No discharge.     Extraocular Movements: Extraocular movements intact.     Conjunctiva/sclera: Conjunctivae normal.     Right eye: Right conjunctiva is not injected.     Left eye: Left conjunctiva is not injected.  Neck:     Trachea: Trachea and phonation normal.  Cardiovascular:     Rate and Rhythm: Normal rate and regular rhythm.     Pulses: Normal pulses.     Heart sounds: Normal heart sounds. No murmur heard.    No friction rub. No gallop.  Pulmonary:     Effort: Pulmonary effort is normal. No accessory muscle usage, prolonged expiration or respiratory distress.     Breath sounds: Normal breath sounds. No stridor, decreased air movement or transmitted upper airway sounds. No decreased breath sounds, wheezing, rhonchi or rales.  Chest:     Chest wall: No tenderness.  Musculoskeletal:        General: Normal range of motion.     Cervical back: Normal range of motion and neck supple. Normal range of motion.  Lymphadenopathy:     Cervical: No cervical adenopathy.  Skin:    General: Skin is warm and dry.     Findings: No erythema or rash.  Neurological:     General: No focal deficit present.     Mental Status: She is alert and oriented to person, place, and time.  Psychiatric:        Mood and Affect: Mood normal.        Behavior: Behavior normal.     Visual Acuity Right Eye Distance:   Left Eye Distance:   Bilateral Distance:    Right Eye Near:   Left Eye Near:    Bilateral Near:     [facility] Couse / Diagnostics / Procedures:     Radiology No  results found.  Procedures Procedures (including critical care time) EKG  Pending results:  Labs Reviewed  SARS CORONAVIRUS 2 (TAT 6-24 HRS)  CULTURE, GROUP A STREP [facility])  POCT RAPID STREP A (OFFICE)    Medications Ordered in UC: Medications - No data to display  UC Diagnoses / Final Clinical Impressions(s)   I have reviewed the triage vital signs and the nursing notes.  Pertinent labs & imaging results that were available during my care of the patient were reviewed by me and considered in my medical decision making (see chart for details).    Final diagnoses:  Acute pharyngitis, unspecified etiology  Viral illness   Rapid strep test today is negative.  Throat culture pending.  We will treat patient with antibiotics based on results of throat culture.  Patient provided with a prescription for ipratropium nasal spray for relief of postnasal drip likely contributing to sore throat.  COVID-19 test pending, patient does not qualify for Paxlovid if her test result is positive.  Conservative care recommended.  Return precautions advised.  ED Prescriptions     Medication Sig Dispense Auth. Provider   ipratropium (ATROVENT) 0.06 % nasal spray Place 2 sprays into both nostrils 3 (three) times daily. As needed for nasal congestion, runny nose 15 mL Theadora Rama Scales, PA-C      PDMP not reviewed this encounter.  Disposition Upon Discharge:  Condition: stable for discharge home Home: take medications as prescribed; routine discharge instructions  as discussed; follow up as advised.  Patient presented with an acute illness with associated systemic symptoms and significant discomfort requiring urgent management. In my opinion, this is a condition that a prudent lay person (someone who possesses an average knowledge of health and medicine) may potentially expect to result in complications if not addressed urgently such as respiratory distress, impairment of bodily function or  dysfunction of bodily organs.   Routine symptom specific, illness specific and/or disease specific instructions were discussed with the patient and/or caregiver at length.   As such, the patient has been evaluated and assessed, work-up was performed and treatment was provided in alignment with urgent care protocols and evidence based medicine.  Patient/parent/caregiver has been advised that the patient may require follow up for further testing and treatment if the symptoms continue in spite of treatment, as clinically indicated and appropriate.  If the patient was tested for COVID-19, Influenza and/or RSV, then the patient/parent/guardian was advised to isolate at home pending the results of his/her diagnostic coronavirus test and potentially longer if they're positive. I have also advised pt that if his/her COVID-19 test returns positive, it's recommended to self-isolate for at least 10 days after symptoms first appeared AND until fever-free for 24 hours without fever reducer AND other symptoms have improved or resolved. Discussed self-isolation recommendations as well as instructions for household member/close contacts as per the [facility] and [employer] DHHS, and also gave patient the [place] packet with this information.  Patient/parent/caregiver has been advised to return to the [facility] or PCP in 3-5 days if no better; to PCP or the Emergency Department if new signs and symptoms develop, or if the current signs or symptoms continue to change or worsen for further workup, evaluation and treatment as clinically indicated and appropriate  The patient will follow up with their current PCP if and as advised. If the patient does not currently have a PCP we will assist them in obtaining one.   The patient may need specialty follow up if the symptoms continue, in spite of conservative treatment and management, for further workup, evaluation, consultation and treatment as clinically indicated and  appropriate.  Patient/parent/caregiver verbalized understanding and agreement of plan as discussed.  All questions were addressed during visit.  Please see discharge instructions below for further details of plan.  Discharge Instructions:   Discharge Instructions      Your strep test today is negative.  Streptococcal throat culture will be performed per our protocol.  The result of your throat culture will be posted to your MyChart once it is complete, this typically takes 3 to 5 days.  If your streptococcal throat culture is positive, you will be contacted by phone and antibiotics will prescribed for you.   You received a COVID-19 PCR test today.  The result of your COVID-19 test will be posted to your MyChart once it is complete, typically this takes 6 to 12 hours.     If your COVID-19 PCR test is positive, you will be contacted by phone.  Please discuss with the callback nurse whether or not you would benefit from antiviral therapy treatment for COVID-19.     If your COVID-19 PCR test is negative, please consider retesting in the next 2 to 3 days, whether they are PCR or antigen tests or both, particularly if you are not feeling any better.  You are welcome to return here to urgent care to have it done or you can take a home COVID-19 test.  It is recommended  that you wear a mask when other people until April 17, 2022 or until you have had 2 consecutive negative COVID-19 tests 48 hours apart.    Also, if your COVID-19 tests remain negative after 2 tests, then your illness is likely due to one of the many less serious illnesses circulating in our community right now.  Conservative care is recommended with rest, drinking plenty of clear fluids, eating only when hungry, taking supportive medications for your symptoms and avoiding being around other people.  Please remain at home until you are fever free for 24 hours without the use of antifever medications such as Tylenol and ibuprofen.    Based  on my physical exam findings and the history you have provided  today, I do not recommend antibiotics at this time.  I do not believe the risks and side effects of antibiotics would outweigh any minimal benefit that they might provide.         Please read below to learn more about the medications, dosages and frequencies that I recommend to help alleviate your symptoms and to get you feeling better soon:  Atrovent (ipratropium): This is an excellent nasal decongestant spray that does not cause rebound congestion, please instill 2 sprays into each nare with each use.  Please use the spray up to 4 times daily as needed.  I have provided you with a prescription for this medication.      Advil, Motrin (ibuprofen): This is a good anti-inflammatory medication which not only addresses aches, pains but also significantly reduces soft tissue inflammation of the upper airways that causes sinus and nasal congestion as well as inflammation of the lower airways which makes you feel like your breathing is constricted or your cough feel tight.  I recommend that you take 400 mg every 8 hours as needed.     Chloraseptic Throat Spray: Spray 5 sprays into affected area every 2 hours, hold for 15 seconds and either swallow or spit it out.  This is a excellent numbing medication because it is a spray, you can put it right where you needed and so sucking on a lozenge and numbing your entire mouth.     Thank you for visiting urgent care today.  I hope you feel better soon and please let us know if you do not.           This office note has been dictated using Teaching laboratory technician.  Unfortunately, this method of dictation can sometimes lead to typographical or grammatical errors.  I apologize for your inconvenience in advance if this occurs.  Please do not hesitate to reach out to me if clarification is needed.      Theadora Rama Scales, PA-C 04/10/22 1255

## 2022-04-09 NOTE — Discharge Instructions (Signed)
Your strep test today is negative.  Streptococcal throat culture will be performed per our protocol.  The result of your throat culture will be posted to your MyChart once it is complete, this typically takes 3 to 5 days.  If your streptococcal throat culture is positive, you will be contacted by phone and antibiotics will prescribed for you.   You received a COVID-19 PCR test today.  The result of your COVID-19 test will be posted to your MyChart once it is complete, typically this takes 6 to 12 hours.     If your COVID-19 PCR test is positive, you will be contacted by phone.  Please discuss with the callback nurse whether or not you would benefit from antiviral therapy treatment for COVID-19.     If your COVID-19 PCR test is negative, please consider retesting in the next 2 to 3 days, whether they are PCR or antigen tests or both, particularly if you are not feeling any better.  You are welcome to return here to urgent care to have it done or you can take a home COVID-19 test.  It is recommended that you wear a mask when other people until April 17, 2022 or until you have had 2 consecutive negative COVID-19 tests 48 hours apart.    Also, if your COVID-19 tests remain negative after 2 tests, then your illness is likely due to one of the many less serious illnesses circulating in our community right now.  Conservative care is recommended with rest, drinking plenty of clear fluids, eating only when hungry, taking supportive medications for your symptoms and avoiding being around other people.  Please remain at home until you are fever free for 24 hours without the use of antifever medications such as Tylenol and ibuprofen.    Based on my physical exam findings and the history you have provided  today, I do not recommend antibiotics at this time.  I do not believe the risks and side effects of antibiotics would outweigh any minimal benefit that they might provide.         Please read below to learn more  about the medications, dosages and frequencies that I recommend to help alleviate your symptoms and to get you feeling better soon:  Atrovent (ipratropium): This is an excellent nasal decongestant spray that does not cause rebound congestion, please instill 2 sprays into each nare with each use.  Please use the spray up to 4 times daily as needed.  I have provided you with a prescription for this medication.      Advil, Motrin (ibuprofen): This is a good anti-inflammatory medication which not only addresses aches, pains but also significantly reduces soft tissue inflammation of the upper airways that causes sinus and nasal congestion as well as inflammation of the lower airways which makes you feel like your breathing is constricted or your cough feel tight.  I recommend that you take 400 mg every 8 hours as needed.     Chloraseptic Throat Spray: Spray 5 sprays into affected area every 2 hours, hold for 15 seconds and either swallow or spit it out.  This is a excellent numbing medication because it is a spray, you can put it right where you needed and so sucking on a lozenge and numbing your entire mouth.     Thank you for visiting urgent care today.  I hope you feel better soon and please let us know if you do not.

## 2022-04-10 LAB — SARS CORONAVIRUS 2 (TAT 6-24 HRS): SARS Coronavirus 2: NEGATIVE

## 2022-04-11 LAB — CULTURE, GROUP A STREP (THRC)

## 2022-04-26 NOTE — Progress Notes (Unsigned)
[name] [address] [place] [address] Phone: [phone] Subjective:    I'm seeing this patient by the request  of:  Carney Living, MD  CC: Left wrist and left ankle pain follow-up  HEN:[id number]  03/09/2022 Continues to have fibromyalgia-like symptoms.  Has been taking the meloxicam relatively regularly.  We will hold on that and given Toradol and Depo-Medrol.  Patient will take a short course of prednisone.  Encouraged her to continue to work on the weight loss.  Discussed icing regimen.  If continuing to have pain can consider the possibility of Effexor or duloxetine.  Follow-up again in 6 to 8 weeks     I do believe the patient actually has a new injury to the peroneal tendons but more at the connection over the fifth metatarsal area.  No cortical irregularity noted.  Discussed icing regimen and home exercises, discussed which activities to do which ones to avoid.  Increase activity slowly.  Discussed heel lift.  Follow-up again in 6 to 8 weeks.      Update 04/27/2022 Joanna [name] is a 46 y.o. female coming in with complaint of L wrist and L ankle pain.  Patient was given a short course of prednisone.  Patient states she is really worried about the left hand on the thumb that gets really bad she can hardly stand the pain. Ankle is doing fine.        Past Medical History:  Diagnosis Date   Anemia    during the time she had fibroids   Anxiety    Arthritis    Depression    Heart murmur    born with a heart murmur, but cleared up years ago   Migraine    occasional   PONV (postoperative nausea and vomiting)    some dry heaving on waking up   Past Surgical History:  Procedure Laterality Date   ABDOMINAL HYSTERECTOMY     BREAST SURGERY     CESAREAN SECTION     RADIOLOGY WITH ANESTHESIA N/A 12/04/2019   Procedure: MRI PELVIS WITH AND WITHOUT CONTRAST,LUMBER SPINE WITHOUT CONTRAST;  Surgeon: Radiologist, Medication, MD;   Location: [facility] OR;  Service: Radiology;  Laterality: N/A;   REDUCTION MAMMAPLASTY Bilateral 2001   Social History   Socioeconomic History   Marital status: Divorced    Spouse name: Not on file   Number of children: Not on file   Years of education: Not on file   Highest education level: Not on file  Occupational History   Not on file  Tobacco Use   Smoking status: Former   Smokeless tobacco: Never   Tobacco comments:    as of 12/02/19 has been 15 years  Vaping Use   Vaping Use: Never used  Substance and Sexual Activity   Alcohol use: No   Drug use: No   Sexual activity: Yes    Birth control/protection: Surgical  Other Topics Concern   Not on file  Social History Narrative   Works at [employer] - [employer]    Daughter - 2008.   Goes to charter school   Social Determinants of Health   Financial Resource Strain: Not on file  Food Insecurity: Not on file  Transportation Needs: Not on file  Physical Activity: Not on file  Stress: Not on file  Social Connections: Not on file   No Known Allergies Family History  Problem Relation Age of Onset   Colon cancer  Neg Hx    Colon polyps Neg Hx    Esophageal cancer Neg Hx    Stomach cancer Neg Hx    Rectal cancer Neg Hx     Current Outpatient Medications (Endocrine & Metabolic):    predniSONE (DELTASONE) 20 MG tablet, Take 2 tablets (40 mg total) by mouth daily with breakfast. (Patient not taking: Reported on 04/27/2022)   Current Outpatient Medications (Respiratory):    ipratropium (ATROVENT) 0.06 % nasal spray, Place 2 sprays into both nostrils 3 (three) times daily. As needed for nasal congestion, runny nose  Current Outpatient Medications (Analgesics):    meloxicam (MOBIC) 15 MG tablet, TAKE 1 TABLET (15 MG TOTAL) BY MOUTH DAILY.   Current Outpatient Medications (Other):    gabapentin (NEURONTIN) 300 MG capsule, TAKE 3 CAPSULES UP TO THREE TIMES A DAY AS NEEDED   ondansetron (ZOFRAN-ODT) 8 MG  disintegrating tablet, 8mg  ODT q4 hours prn nausea   Semaglutide-Weight Management (WEGOVY) 0.25 MG/0.5ML SOAJ, Inject 0.25 mg into the skin once a week.   tiZANidine (ZANAFLEX) 4 MG tablet, TAKE 1 TABLET (4 MG TOTAL) BY MOUTH NIGHTLY FOR 10 DAYS, AS DIRECTED BY MD   Vitamin D, Ergocalciferol, (DRISDOL) 1.25 MG (50000 UNIT) CAPS capsule, Take 1 capsule (50,000 Units total) by mouth every 7 (seven) days.   Reviewed prior external information including notes and imaging from  primary care provider As well as notes that were available from care everywhere and other healthcare systems.  Past medical history, social, surgical and family history all reviewed in electronic medical record.  No pertanent information unless stated regarding to the chief complaint.   Review of Systems:  No headache, visual changes, nausea, vomiting, diarrhea, constipation, dizziness, abdominal pain, skin rash, fevers, chills, night sweats, weight loss, swollen lymph nodes, body aches, joint swelling, chest pain, shortness of breath, mood changes. POSITIVE muscle aches  Objective  Blood pressure 134/70, pulse 67, height 5\' 3"  (1.6 m), weight 204 lb (92.5 kg), SpO2 98 %.   General: No apparent distress alert and oriented x3 mood and affect normal, dressed appropriately.  HEENT: Pupils equal, extraocular movements intact  Respiratory: Patient's speak in full sentences and does not appear short of breath  Cardiovascular: No lower extremity edema, non tender, no erythema  Left wrist exam shows that patient does have a positive CMC grind test noted.  Patient has a negative .  Limited muscular skeletal ultrasound was performed and interpreted by , M   Limited ultrasound shows that patient does have some hypoechoic changes with mild synovitis also noted of the [facility] joint.  Mild narrowing noted.  Patient does have some calcific changes that does appear to be more some bone spurring. Impression: Mild to  moderate arthritis with bone spurring and effusion    Impression and Recommendations:      The above documentation has been reviewed and is accurate and complete Antoine Primas, DO

## 2022-04-27 ENCOUNTER — Ambulatory Visit: Payer: Self-pay

## 2022-04-27 ENCOUNTER — Ambulatory Visit: Payer: 59 | Admitting: Family Medicine

## 2022-04-27 ENCOUNTER — Encounter: Payer: Self-pay | Admitting: Family Medicine

## 2022-04-27 VITALS — BP 134/70 | HR 67 | Ht 63.0 in | Wt 204.0 lb

## 2022-04-27 DIAGNOSIS — M1812 Unilateral primary osteoarthritis of first carpometacarpal joint, left hand: Secondary | ICD-10-CM

## 2022-04-27 DIAGNOSIS — M25532 Pain in left wrist: Secondary | ICD-10-CM | POA: Diagnosis not present

## 2022-04-27 NOTE — Patient Instructions (Signed)
Good to see you  Heat for ten minutes and massage then follow with ice for 10 minutes  Voltaren 2 times a day Brace at night You have 5- weeks to get better then you get the needle  Follow up 5-6 weeks

## 2022-04-27 NOTE — Assessment & Plan Note (Signed)
This is CMC arthritis with some spurring noted.  Discussed the potential for injections with patient declined.  We will try topical anti-inflammatories, icing regimen, home exercises and bracing at night.  Patient will try this and see how she responds and would consider otherwise.  Follow-up again in 7 to 8 weeks

## 2022-05-13 ENCOUNTER — Other Ambulatory Visit: Payer: Self-pay | Admitting: Family Medicine

## 2022-05-26 ENCOUNTER — Other Ambulatory Visit: Payer: Self-pay | Admitting: Family Medicine

## 2022-05-26 ENCOUNTER — Other Ambulatory Visit: Payer: Self-pay

## 2022-05-26 DIAGNOSIS — G609 Hereditary and idiopathic neuropathy, unspecified: Secondary | ICD-10-CM

## 2022-05-26 MED ORDER — VITAMIN D (ERGOCALCIFEROL) 1.25 MG (50000 UNIT) PO CAPS: 50000.0000 [IU] | ORAL_CAPSULE | ORAL | 0 refills | Status: DC

## 2022-05-26 MED ORDER — GABAPENTIN 300 MG PO CAPS: ORAL_CAPSULE | ORAL | 1 refills | Status: DC

## 2022-05-26 NOTE — Telephone Encounter (Signed)
She said Dr Katrinka Blazing had previously prescribed 3 capsules 3 times a day but insurance would to approve it and we were supposed to fill out a form for approval? If that has not been done, it can be sent as 2 capsules 3 times a day because she will be out today.

## 2022-05-26 NOTE — Telephone Encounter (Signed)
Left message for patient to call back to clarify dosing for gabapentin.

## 2022-06-01 NOTE — Progress Notes (Signed)
[name] [address] [place] [address] Phone: [phone] Subjective:   [name], am serving as a scribe for Dr. Antoine [name].  I'm seeing this patient by the request  of:  Joanna [name] Living, MD  CC: Wrist and ankle pain  PIR:[id number]  04/27/2022 This is CMC arthritis with some spurring noted.  Discussed the potential for injections with patient declined.  We will try topical anti-inflammatories, icing regimen, home exercises and bracing at night.  Patient will try this and see how she responds and would consider otherwise.  Follow-up again in 7 to 8 weeks     Update 06/09/2022 Joanna [name] [name] is a 46 y.o. female coming in with complaint of L thumb and wrist pain. Patient states that she is doing better. Not as much pain as last visit but does feel an occasionally shooting pain.        Past Medical History:  Diagnosis Date   Anemia    during the time she had fibroids   Anxiety    Arthritis    Depression    Heart murmur    born with a heart murmur, but cleared up years ago   Migraine    occasional   PONV (postoperative nausea and vomiting)    some dry heaving on waking up   Past Surgical History:  Procedure Laterality Date   ABDOMINAL HYSTERECTOMY     BREAST SURGERY     CESAREAN SECTION     RADIOLOGY WITH ANESTHESIA N/A 12/04/2019   Procedure: MRI PELVIS WITH AND WITHOUT CONTRAST,LUMBER SPINE WITHOUT CONTRAST;  Surgeon: Radiologist, Medication, MD;  Location: [facility] OR;  Service: Radiology;  Laterality: N/A;   REDUCTION MAMMAPLASTY Bilateral 2001   Social History   Socioeconomic History   Marital status: Divorced    Spouse name: Not on file   Number of children: Not on file   Years of education: Not on file   Highest education level: Not on file  Occupational History   Not on file  Tobacco Use   Smoking status: Former   Smokeless tobacco: Never   Tobacco comments:    as of 12/02/19 has been 15 years   Vaping Use   Vaping Use: Never used  Substance and Sexual Activity   Alcohol use: No   Drug use: No   Sexual activity: Yes    Birth control/protection: Surgical  Other Topics Concern   Not on file  Social History Narrative   Works at [employer] - [employer]    Daughter - 2008.   Goes to charter school   Social Determinants of Corporate investment banker Strain: Not on file  Food Insecurity: Not on file  Transportation Needs: Not on file  Physical Activity: Not on file  Stress: Not on file  Social Connections: Not on file   No Known Allergies Family History  Problem Relation Age of Onset   Colon cancer Neg Hx    Colon polyps Neg Hx    Esophageal cancer Neg Hx    Stomach cancer Neg Hx    Rectal cancer Neg Hx     Current Outpatient Medications (Endocrine & Metabolic):    predniSONE (DELTASONE) 20 MG tablet, Take 2 tablets (40 mg total) by mouth daily with breakfast.   Current Outpatient Medications (Respiratory):    ipratropium (ATROVENT) 0.06 % nasal spray, Place 2 sprays into both nostrils 3 (three) times daily. As needed for nasal congestion, runny nose  Current Outpatient Medications (Analgesics):    meloxicam (MOBIC) 15 MG tablet, TAKE 1 TABLET (15 MG TOTAL) BY MOUTH DAILY.   Current Outpatient Medications (Other):    gabapentin (NEURONTIN) 300 MG capsule, TAKE 3 CAPSULES UP TO TWO TIMES A DAY.   ondansetron (ZOFRAN-ODT) 8 MG disintegrating tablet, 8mg  ODT q4 hours prn nausea   Semaglutide-Weight Management (WEGOVY) 0.25 MG/0.5ML SOAJ, Inject 0.25 mg into the skin once a week.   tiZANidine (ZANAFLEX) 4 MG tablet, TAKE 1 TABLET (4 MG TOTAL) BY MOUTH NIGHTLY FOR 10 DAYS, AS DIRECTED BY MD   Vitamin D, Ergocalciferol, (DRISDOL) 1.25 MG (50000 UNIT) CAPS capsule, Take 1 capsule (50,000 Units total) by mouth every 7 (seven) days.   Reviewed prior external information including notes and imaging from  primary care provider As well as notes  that were available from care everywhere and other healthcare systems.  Past medical history, social, surgical and family history all reviewed in electronic medical record.  No pertanent information unless stated regarding to the chief complaint.   Review of Systems:  No headache, visual changes, nausea, vomiting, diarrhea, constipation, dizziness, abdominal pain, skin rash, fevers, chills, night sweats, weight loss, swollen lymph nodes, body aches, joint swelling, chest pain, shortness of breath, mood changes. POSITIVE muscle aches  Objective  Blood pressure 118/82, pulse 82, height 5\' 3"  (1.6 m), weight 202 lb (91.6 kg), SpO2 99 %.   General: No apparent distress alert and oriented x3 mood and affect normal, dressed appropriately.  HEENT: Pupils equal, extraocular movements intact  Respiratory: Patient's speak in full sentences and does not appear short of breath  Cardiovascular: No lower extremity edema, non tender, no erythema  Left wrist exam shows the patient still has a positive grind test noted.  No weakness with the thenar eminence and no wasting.  Patient is tender to palpation over the [facility] joint itself.  Limited muscular skeletal ultrasound was performed and interpreted by , M   Limited ultrasound shows shows that there is hypoechoic changes noted.  These seem to be more in the [facility] joint.  Still has some spurring occurring.  Procedure: Real-time Ultrasound Guided Injection of left CMC joint Device: GE Logiq Q7 Ultrasound guided injection is preferred based studies that show increased duration, increased effect, greater accuracy, decreased procedural pain, increased response rate, and decreased cost with ultrasound guided versus blind injection.  Verbal informed consent obtained.  Time-out conducted.  Noted no overlying erythema, induration, or other signs of local infection.  Skin prepped in a sterile fashion.  Local anesthesia: Topical Ethyl chloride.  With  sterile technique and under real time ultrasound guidance: With a 25-gauge half inch needle injected with 0.5 cc of 0.5% Marcaine and 0.5 cc of Kenalog 40 mg/mL Completed without difficulty  Pain immediately resolved suggesting accurate placement of the medication.  Advised to call if fevers/chills, erythema, induration, drainage, or persistent bleeding.  Impression: Technically successful ultrasound guided injection.    Impression and Recommendations:    The above documentation has been reviewed and is accurate and complete Joanna [name] Primas, DO

## 2022-06-07 ENCOUNTER — Other Ambulatory Visit: Payer: Self-pay | Admitting: Family Medicine

## 2022-06-08 ENCOUNTER — Ambulatory Visit: Payer: Self-pay

## 2022-06-08 ENCOUNTER — Encounter: Payer: Self-pay | Admitting: Family Medicine

## 2022-06-08 ENCOUNTER — Ambulatory Visit: Payer: 59 | Admitting: Family Medicine

## 2022-06-08 VITALS — BP 118/82 | HR 82 | Ht 63.0 in | Wt 202.0 lb

## 2022-06-08 DIAGNOSIS — G8929 Other chronic pain: Secondary | ICD-10-CM

## 2022-06-08 DIAGNOSIS — M79645 Pain in left finger(s): Secondary | ICD-10-CM | POA: Diagnosis not present

## 2022-06-08 DIAGNOSIS — M1812 Unilateral primary osteoarthritis of first carpometacarpal joint, left hand: Secondary | ICD-10-CM | POA: Diagnosis not present

## 2022-06-08 NOTE — Assessment & Plan Note (Signed)
Patient given injection and tolerated the procedure well, discussed icing regimen and home exercises, which activities to do and which ones to avoid.  Discussed potential custom bracing.  Patient hopefully will do very well with the conservative therapy.  Follow-up again in 6 to 8 weeks

## 2022-06-08 NOTE — Patient Instructions (Addendum)
Injected thumb today Keep wearing brace as needed Ice at end of long day See me in 6-8 weeks

## 2022-06-28 ENCOUNTER — Other Ambulatory Visit: Payer: Self-pay | Admitting: Family Medicine

## 2022-07-20 ENCOUNTER — Other Ambulatory Visit: Payer: Self-pay | Admitting: Family Medicine

## 2022-07-20 ENCOUNTER — Ambulatory Visit: Payer: 59 | Admitting: Family Medicine

## 2022-07-21 ENCOUNTER — Other Ambulatory Visit: Payer: Self-pay | Admitting: Family Medicine

## 2022-07-28 ENCOUNTER — Other Ambulatory Visit: Payer: Self-pay | Admitting: Family Medicine

## 2022-08-08 ENCOUNTER — Encounter: Payer: Self-pay | Admitting: Family Medicine

## 2022-08-08 NOTE — Progress Notes (Deleted)
    SUBJECTIVE:   CHIEF COMPLAINT / HPI:   Obesity Worsened.  She has tried bariatric clinic with injections but did very expensive.  Would like to see if insurance will pay for [facility] so I will send in Rx.  Also referral to [facility],  Encouraged to regular exercise and diet - she may do this with her daughter   PERTINENT  PMH / PSH: *** Patient Active Problem List   Diagnosis Date Noted   Arthritis of carpometacarpal [facility]) joint of left thumb 04/27/2022   Obesity 12/27/2021   De Quervain's tenosynovitis, left 10/27/2021   Right ankle pain 08/18/2021   Cervical disc disorder with radiculopathy of cervical region 01/26/2021   Nonallopathic lesion of sacral region 08/15/2019   Nonallopathic lesion of lumbar region 08/15/2019   Nonallopathic lesion of thoracic region 08/15/2019   Left lumbar radiculopathy 07/21/2019   Hereditary and idiopathic peripheral neuropathy 05/13/2019   Partial nontraumatic rupture of right rotator cuff 07/24/2018   Fracture of base of fifth metatarsal bone of left foot at metaphyseal-diaphyseal junction, closed, initial encounter 01/22/2018   Peroneal tendinitis of left lower extremity 12/21/2017   Fibromyalgia 08/23/2016   Bilateral low back pain 11/21/2012   MENOPAUSE, SURGICAL 05/11/2010   SICKLE CELL TRAIT 08/09/2006   MIGRAINE, UNSPEC., W/O INTRACTABLE MIGRAINE 08/09/2006    Current Outpatient Medications  Medication Instructions   gabapentin (NEURONTIN) 300 MG capsule TAKE 3 CAPSULES UP TO TWO TIMES A DAY.   ipratropium (ATROVENT) 0.06 % nasal spray 2 sprays, Each Nare, 3 times daily, As needed for nasal congestion, runny nose   meloxicam (MOBIC) 15 mg, Oral, Daily   ondansetron (ZOFRAN-ODT) 8 MG disintegrating tablet 38m ODT q4 hours prn nausea   predniSONE (DELTASONE) 40 mg, Oral, Daily with breakfast   tiZANidine (ZANAFLEX) 4 MG tablet TAKE 1 TABLET BY MOUTH NIGHTLY FOR 10 DAYS AS DIRECTED BY MD   Vitamin D (Ergocalciferol)  (DRISDOL) 50,000 Units, Oral, Every 7 days   Wegovy 0.25 mg, Subcutaneous, Weekly       06/08/2022    2:50 PM 04/27/2022    2:30 PM 04/09/2022    8:28 AM  Vitals with BMI  Height 5' 3"$  5' 3"$    Weight 202 lbs 204 lbs   BMI [id number]  Systolic [id number] Diastolic 82 70 65  Pulse 82 67 74      OBJECTIVE:   There were no vitals taken for this visit.  ***  ASSESSMENT/PLAN:   There are no diagnoses linked to this encounter.   There are no Patient Instructions on file for this visit.   MLind Covert MD [place]

## 2022-08-29 ENCOUNTER — Ambulatory Visit (INDEPENDENT_AMBULATORY_CARE_PROVIDER_SITE_OTHER): Payer: 59 | Admitting: Family Medicine

## 2022-08-29 ENCOUNTER — Other Ambulatory Visit: Payer: Self-pay

## 2022-08-29 ENCOUNTER — Encounter: Payer: Self-pay | Admitting: Family Medicine

## 2022-08-29 ENCOUNTER — Other Ambulatory Visit: Payer: Self-pay | Admitting: Family Medicine

## 2022-08-29 VITALS — BP 117/73 | HR 78 | Ht 63.0 in | Wt 192.0 lb

## 2022-08-29 DIAGNOSIS — Z1159 Encounter for screening for other viral diseases: Secondary | ICD-10-CM

## 2022-08-29 DIAGNOSIS — E6609 Other obesity due to excess calories: Secondary | ICD-10-CM | POA: Diagnosis not present

## 2022-08-29 DIAGNOSIS — Z6835 Body mass index (BMI) 35.0-35.9, adult: Secondary | ICD-10-CM

## 2022-08-29 DIAGNOSIS — Z13220 Encounter for screening for lipoid disorders: Secondary | ICD-10-CM

## 2022-08-29 NOTE — Progress Notes (Signed)
    SUBJECTIVE:   CHIEF COMPLAINT / HPI:   Obesity - she has been losing weight but thinks this is due to decreased appetite after her Mom died a month ago.  She feels she is coping ok and has people to talk with     OBJECTIVE:   BP 117/73   Pulse 78   Ht 5\' 3"  (1.6 m)   Wt 192 lb (87.1 kg)   SpO2 100%   BMI 34.01 kg/m   Heart - Regular rate and rhythm.  No murmurs, gallops or rubs.    Lungs:  Normal respiratory effort, chest expands symmetrically. Lungs are clear to auscultation, no crackles or wheezes. Abdomen: soft and non-tender without masses, organomegaly or hernias noted.  No guarding or rebound Extremities:  No cyanosis, edema, or deformity noted with good range of motion of all major joints.   Mobility:able to get up and down from exam table without assistance or distress   ASSESSMENT/PLAN:   Screening cholesterol level -     Lipid panel  Need for hepatitis C screening test -     Hepatitis C antibody  Class 2 obesity due to excess calories without serious comorbidity with body mass index (BMI) of 35.0 to 35.9 in adult Assessment & Plan: Improved but she feels is due to recent loss of her mom.  She might want to try wegovy when she regains her appetite.      Annual Examination Female over 49 yo  I reviewed the following patient responses on our Physical Exam Form Tobacco use and candidacy for lung cancer screening Alcohol Use  Weight  Exercise  Risk for STI  Fall risk Advanced Directive Increased family cancer risk Violence risk  PHQ9 score reviewed  Blood pressure reviewed  I considered the following items based upon USPSTF recommendations: Cervical Cancer if female at birth 24 Colon cancer screening Osteoporosis screening HIV testing:  Hepatitis C testing Cholesterol screening STI screening if high risk (Hepatitis B, Syphilis, Gonorrhea, Chlamydia) Immunizations - Influenza, Covid, [place], Pneumonia, Tetanus  See After Visit  Summary for recommendations   Patient Instructions  Good to see you today - Thank you for coming in  Things we discussed today:  Consider 500 mg calcium tablet a day  You should exercise at least 20 minutes every day.    Choose something you like the most or hate the least.   Having a set time every day and having a partner will help you stick to it.    If you are too tired try to do at least 5 minutes, it often gets easier.   Your goal blood pressure is less than 140/90.  Check your blood pressure several times a week.  If regularly higher than this please let me know - either with MyChart or leaving a phone message. Next visit please bring in your blood pressure cuff.     Let me know if the stress with your loss is not improving    Lind Covert, MD [place]

## 2022-08-29 NOTE — Assessment & Plan Note (Signed)
Improved but she feels is due to recent loss of her mom.  She might want to try wegovy when she regains her appetite.

## 2022-08-29 NOTE — Patient Instructions (Addendum)
Good to see you today - Thank you for coming in  Things we discussed today:  Consider 500 mg calcium tablet a day  You should exercise at least 20 minutes every day.    Choose something you like the most or hate the least.   Having a set time every day and having a partner will help you stick to it.    If you are too tired try to do at least 5 minutes, it often gets easier.   Your goal blood pressure is less than 140/90.  Check your blood pressure several times a week.  If regularly higher than this please let me know - either with MyChart or leaving a phone message. Next visit please bring in your blood pressure cuff.     Let me know if the stress with your loss is not improving

## 2022-08-30 ENCOUNTER — Encounter: Payer: Self-pay | Admitting: Family Medicine

## 2022-08-30 LAB — HEPATITIS C ANTIBODY: Hep C Virus Ab: NONREACTIVE

## 2022-08-30 LAB — LIPID PANEL
Chol/HDL Ratio: 3.4 ratio (ref 0.0–4.4)
Cholesterol, Total: 162 mg/dL (ref 100–199)
HDL: 48 mg/dL (ref >39)
LDL Chol Calc (NIH): 101 mg/dL — ABNORMAL HIGH (ref 0–99)
Triglycerides: 65 mg/dL (ref 0–149)
VLDL Cholesterol Cal: 13 mg/dL (ref 5–40)

## 2022-08-31 ENCOUNTER — Other Ambulatory Visit: Payer: Self-pay | Admitting: Family Medicine

## 2022-08-31 DIAGNOSIS — G609 Hereditary and idiopathic neuropathy, unspecified: Secondary | ICD-10-CM

## 2022-09-21 ENCOUNTER — Telehealth: Payer: Self-pay

## 2022-09-21 DIAGNOSIS — E6609 Other obesity due to excess calories: Secondary | ICD-10-CM

## 2022-09-21 NOTE — Telephone Encounter (Signed)
Patient calls nurse line regarding [facility] prescription. She states that she would like to try Ozempic in place of Wegovy.   Please advise.   Veronda Prude, RN

## 2022-09-22 NOTE — Telephone Encounter (Signed)
Called Pt, she will check with her insurance to see which medication they might cover. If insurance doesn't cover she will see how much the meds will cost out of pocket. Pt will call back with an answer.  Penni Bombard

## 2022-09-22 NOTE — Telephone Encounter (Signed)
Patient calls nurse line reporting she would like to move forward with [facility].   She reports she spoke with her insurance company and once medication is sent in we would need to complete a prior auth.   Insurance will not cover Ozempic due to not having a diagnoses of Diabetes.   Will forward to PCP.

## 2022-09-22 NOTE — Telephone Encounter (Signed)
Please call her and let her know they are the same medication and insurance is very unlikely to pay for either.  She should check with her insurance and see what medication they will pay for or check prices at pharmacies online if she wishes to pay for herself  Can also come in for a visit for discussion about weight control   Thanks LC

## 2022-09-28 NOTE — Telephone Encounter (Signed)
Pt left message inquiring about PA for [facility]. Not sure if she has it confused with the [facility]?... I see previous notes & see that Mounjaro hasn't been sent yet.   Will wait for response from pcp.

## 2022-10-01 ENCOUNTER — Other Ambulatory Visit: Payer: Self-pay | Admitting: Family Medicine

## 2022-10-02 ENCOUNTER — Other Ambulatory Visit: Payer: Self-pay

## 2022-10-02 ENCOUNTER — Telehealth: Payer: Self-pay

## 2022-10-02 MED ORDER — MELOXICAM 15 MG PO TABS: 15.0000 mg | ORAL_TABLET | Freq: Every day | ORAL | 0 refills | Status: DC

## 2022-10-02 MED ORDER — MOUNJARO 2.5 MG/0.5ML ~~LOC~~ SOAJ: 2.5000 mg | SUBCUTANEOUS | 1 refills | Status: DC

## 2022-10-02 NOTE — Telephone Encounter (Signed)
Spoke with patient. She is using  of meloxicam. Last script written for this instead of 7.5mg . Refill sent in for  of meloxicam.

## 2022-10-02 NOTE — Addendum Note (Signed)
Addended by: Carney Living on: 10/02/2022 03:33 PM   Modules accepted: Orders

## 2022-10-02 NOTE — Telephone Encounter (Signed)
Rx for Joanna [name] was sent  Thanks all

## 2022-10-02 NOTE — Telephone Encounter (Signed)
A Prior Authorization was initiated for this patients MOUNJARO 2.5 DOSE PENS through CoverMyMeds.   Key: [id number]

## 2022-10-02 NOTE — Telephone Encounter (Signed)
Patient called stating that she never picked up the last prescription that was filled and the pharmacy would need a new script.  Can this be refilled for her?

## 2022-10-04 ENCOUNTER — Other Ambulatory Visit: Payer: Self-pay | Admitting: Family Medicine

## 2022-10-04 DIAGNOSIS — E6609 Other obesity due to excess calories: Secondary | ICD-10-CM

## 2022-10-06 NOTE — Telephone Encounter (Signed)
Prior Auth for patients medication mounjaro denied by [facility] via CoverMyMeds.   Reason: medications for weight loss are excluded from patients benefit plan.  CoverMyMeds Key: [id number]

## 2022-11-06 ENCOUNTER — Other Ambulatory Visit: Payer: Self-pay | Admitting: Family Medicine

## 2022-11-06 DIAGNOSIS — G609 Hereditary and idiopathic neuropathy, unspecified: Secondary | ICD-10-CM

## 2022-12-04 ENCOUNTER — Other Ambulatory Visit: Payer: Self-pay | Admitting: Family Medicine

## 2023-01-01 ENCOUNTER — Other Ambulatory Visit: Payer: Self-pay | Admitting: Family Medicine

## 2023-01-04 ENCOUNTER — Other Ambulatory Visit: Payer: Self-pay | Admitting: Family Medicine

## 2023-01-04 DIAGNOSIS — G609 Hereditary and idiopathic neuropathy, unspecified: Secondary | ICD-10-CM

## 2023-01-13 ENCOUNTER — Other Ambulatory Visit: Payer: Self-pay | Admitting: Family Medicine

## 2023-01-31 ENCOUNTER — Other Ambulatory Visit: Payer: Self-pay | Admitting: Family Medicine

## 2023-03-02 ENCOUNTER — Other Ambulatory Visit: Payer: Self-pay | Admitting: Family Medicine

## 2023-03-02 DIAGNOSIS — G609 Hereditary and idiopathic neuropathy, unspecified: Secondary | ICD-10-CM

## 2023-04-05 ENCOUNTER — Other Ambulatory Visit: Payer: Self-pay

## 2023-04-05 ENCOUNTER — Telehealth: Payer: Self-pay | Admitting: Family Medicine

## 2023-04-05 MED ORDER — MELOXICAM 15 MG PO TABS: 15.0000 mg | ORAL_TABLET | Freq: Every day | ORAL | 0 refills | Status: DC

## 2023-04-05 NOTE — Telephone Encounter (Signed)
Rx filled

## 2023-04-05 NOTE — Telephone Encounter (Signed)
Patient called requesting a refill on: meloxicam (MOBIC) 15 MG tablet   Pharmacy:  [facility] [id number]

## 2023-04-17 ENCOUNTER — Other Ambulatory Visit: Payer: Self-pay

## 2023-04-17 ENCOUNTER — Encounter: Payer: Self-pay | Admitting: Family Medicine

## 2023-04-17 ENCOUNTER — Ambulatory Visit (INDEPENDENT_AMBULATORY_CARE_PROVIDER_SITE_OTHER): Payer: 59 | Admitting: Family Medicine

## 2023-04-17 VITALS — BP 134/90 | HR 73 | Ht 61.0 in | Wt 180.8 lb

## 2023-04-17 DIAGNOSIS — Z01818 Encounter for other preprocedural examination: Secondary | ICD-10-CM

## 2023-04-17 NOTE — Progress Notes (Signed)
    SUBJECTIVE:   CHIEF COMPLAINT / HPI:   Joanna [name] is a 47 y.o. female who presents today for pre-op evaluation for surgical clearance.  Pt will be getting a tummy tuck with fat transfer to back and hips on 05/29/23 with [facility]; Dr. Rosalio Loud. Needing bloodwork and other workup done: has form detailing needs. Also needing a different surgical clearance form faxed back.  Has had prior surgery before; breast reduction, hysterectomy, C section. Reports she becomes sick after anesthesia but otherwise has no complications. The anesthesiologist typically gives her something for nausea beforehand.  No bleeding complications with prior surgeries; no personal or Fhx of bleeding disorders. No immediate Fhx of anesthesia complications (an aunt with complications who would get sick after anesthesia).  No heart or lung disease. No recent illness. Also sees a sports med doc, otherwise no other doctors. Does not use tobacco products.   No interest in flu or covid vaccination today.  Of note, visit and exam was performed prior to patient calling surgeon's office for clarification and realizing that she was too early to get clearance + workup done; this will need to be done within 30 days of her surgery date.  PERTINENT  PMH / PSH: Fibromyalgia, radiculopathy, back pain  OBJECTIVE:   BP (!) 171/94   Pulse 73   Ht 5\' 1"  (1.549 m)   Wt 180 lb 12.8 oz (82 kg)   SpO2 100%   BMI 34.16 kg/m   General: Pt seated in chair, no acute distress. Cardiovascular: RRR, no murmurs, rubs, gallops. Pulmonary: Normal work of breathing. Lungs clear to auscultation bilaterally. Neuro/Psych: Alert and oriented to person, place, event, time. Normal affect.  ASSESSMENT/PLAN:   1. Pre-operative exam Pt will require pre-op clearance within 30 days of her 05/29/23 surgery date; she will return once she is within this window. Workup pending for future. Pt to check with surgical team whether she  needs all lab work that had been indicated (ie, whether she needs hCG, CXR) and whether she can remain on gabapentin through her surgery. - Pt to return after 04/29/23   Governor Rooks, Medical Student [place]  I was present during key history taking and physical exam and any procedures.  I agree with the note above L Terryon Pineiro MD  Will not charge for today visit as she will need to return

## 2023-04-17 NOTE — Progress Notes (Unsigned)
   Rubin Payor, PhD, LAT, ATC acting as a scribe for Clementeen Graham, MD.  Joanna [name] is a 47 y.o. female who presents to [facility] today for polyarthralgia. She was last seen by Dr. Katrinka Blazing on 06/08/22 for L thumb pain and was given a L 1st [facility] steroid injection.   Today, pt c/o polyarthralgia. She has an upcoming cruise, ***. Pt locates pain to ***   Pertinent review of systems: ***  Relevant historical information: ***   Exam:  There were no vitals taken for this visit. General: Well Developed, well nourished, and in no acute distress.   MSK: ***    Lab and Radiology Results No results found for this or any previous visit (from the past 72 hour(s)). No results found.     Assessment and Plan: 47 y.o. female with ***   PDMP not reviewed this encounter. No orders of the defined types were placed in this encounter.  No orders of the defined types were placed in this encounter.    Discussed warning signs or symptoms. Please see discharge instructions. Patient expresses understanding.   ***

## 2023-04-17 NOTE — Progress Notes (Deleted)
    SUBJECTIVE:   CHIEF COMPLAINT / HPI:   ***  PERTINENT  PMH / PSH: *** Patient Active Problem List   Diagnosis Date Noted   Arthritis of carpometacarpal [facility]) joint of left thumb 04/27/2022   Obesity 12/27/2021   De Quervain's tenosynovitis, left 10/27/2021   Right ankle pain 08/18/2021   Cervical disc disorder with radiculopathy of cervical region 01/26/2021   Nonallopathic lesion of sacral region 08/15/2019   Nonallopathic lesion of lumbar region 08/15/2019   Nonallopathic lesion of thoracic region 08/15/2019   Left lumbar radiculopathy 07/21/2019   Hereditary and idiopathic peripheral neuropathy 05/13/2019   Partial nontraumatic rupture of right rotator cuff 07/24/2018   Fracture of base of fifth metatarsal bone of left foot at metaphyseal-diaphyseal junction, closed, initial encounter 01/22/2018   Peroneal tendinitis of left lower extremity 12/21/2017   Fibromyalgia 08/23/2016   Bilateral low back pain 11/21/2012   MENOPAUSE, SURGICAL 05/11/2010   SICKLE CELL TRAIT 08/09/2006   MIGRAINE, UNSPEC., W/O INTRACTABLE MIGRAINE 08/09/2006    Current Outpatient Medications  Medication Instructions   gabapentin (NEURONTIN) 300 MG capsule TAKE 1 OR 2 CAPSULES BY MOUTH UP TO 3 TIMES A DAY AS NEEDED   ipratropium (ATROVENT) 0.06 % nasal spray 2 sprays, Each Nare, 3 times daily, As needed for nasal congestion, runny nose   meloxicam (MOBIC) 15 mg, Oral, Daily   tirzepatide (MOUNJARO) 2.5 MG/0.5ML Pen INJECT 2.5 MG SUBCUTANEOUSLY WEEKLY   tiZANidine (ZANAFLEX) 4 MG tablet TAKE 1 TABLET BY MOUTH NIGHTLY FOR 10 DAYS AS DIRECTED BY MD   Vitamin D (Ergocalciferol) (DRISDOL) 50,000 Units, Oral, Every 7 days       08/29/2022   10:04 AM 08/29/2022    9:38 AM 06/08/2022    2:50 PM  Vitals with BMI  Height  5\' 3"  5\' 3"   Weight  192 lbs 202 lbs  BMI  34.02 35.79  Systolic 117 148 161  Diastolic 73 85 82  Pulse  78 82      OBJECTIVE:   There were no vitals taken for this visit.   ***  ASSESSMENT/PLAN:   There are no diagnoses linked to this encounter.   There are no Patient Instructions on file for this visit.   Carney Living, MD [facility]

## 2023-04-17 NOTE — Patient Instructions (Addendum)
Joanna [name],  It was lovely seeing you in clinic today! You came in for surgical clearance and workup. Unfortunately, we realized you're a bit early for this visit! Please come back after 04/29/23 for your official clearance and workup.  There are a few things for you to do after today's visit: Ask your surgical team whether all the labs and workup they requested are needed for you; for example, do you need a pregnancy test or a chest Xray? Ask your surgical team if you can remain on gabapentin, as we do not see a medical reason why this would need to be held prior to surgery. Make a follow-up appointment when you're within 30 days of surgery (after 04/29/23)!  Thank you for allowing [place] to be a part of your care team! Governor Rooks, medical student Dr. Oda Cogan

## 2023-04-18 ENCOUNTER — Encounter: Payer: Self-pay | Admitting: Family Medicine

## 2023-04-18 ENCOUNTER — Ambulatory Visit (INDEPENDENT_AMBULATORY_CARE_PROVIDER_SITE_OTHER): Payer: 59 | Admitting: Family Medicine

## 2023-04-18 VITALS — BP 120/82 | HR 79 | Ht 61.0 in | Wt 180.0 lb

## 2023-04-18 DIAGNOSIS — M79645 Pain in left finger(s): Secondary | ICD-10-CM

## 2023-04-18 DIAGNOSIS — G8929 Other chronic pain: Secondary | ICD-10-CM | POA: Diagnosis not present

## 2023-04-18 DIAGNOSIS — M797 Fibromyalgia: Secondary | ICD-10-CM | POA: Diagnosis not present

## 2023-04-18 DIAGNOSIS — M1812 Unilateral primary osteoarthritis of first carpometacarpal joint, left hand: Secondary | ICD-10-CM

## 2023-04-18 MED ORDER — PREDNISONE 50 MG PO TABS: 50.0000 mg | ORAL_TABLET | Freq: Every day | ORAL | 0 refills | Status: DC

## 2023-04-18 MED ORDER — METHYLPREDNISOLONE ACETATE 80 MG/ML IJ SUSP
80.0000 mg | Freq: Once | INTRAMUSCULAR | Status: AC
  Administered 2023-04-18: 80 mg via INTRAMUSCULAR

## 2023-04-18 MED ORDER — KETOROLAC TROMETHAMINE 60 MG/2ML IM SOLN
60.0000 mg | Freq: Once | INTRAMUSCULAR | Status: AC
  Administered 2023-04-18: 60 mg via INTRAMUSCULAR

## 2023-04-18 MED ORDER — SCOPOLAMINE 1 MG/3DAYS TD PT72: 1.0000 | MEDICATED_PATCH | TRANSDERMAL | 12 refills | Status: DC

## 2023-04-18 NOTE — Patient Instructions (Signed)
Thank you for coming in today.   You received an injection today. Seek immediate medical attention if the joint becomes red, extremely painful, or is oozing fluid.  

## 2023-04-18 NOTE — Addendum Note (Signed)
Addended by: Rodolph Bong on: 04/18/2023 11:49 AM   Modules accepted: Orders

## 2023-04-24 ENCOUNTER — Other Ambulatory Visit: Payer: Self-pay | Admitting: Family Medicine

## 2023-04-24 DIAGNOSIS — G609 Hereditary and idiopathic neuropathy, unspecified: Secondary | ICD-10-CM

## 2023-04-26 ENCOUNTER — Ambulatory Visit: Payer: 59 | Admitting: Family Medicine

## 2023-05-04 ENCOUNTER — Encounter: Payer: Self-pay | Admitting: Family Medicine

## 2023-05-07 ENCOUNTER — Encounter: Payer: Self-pay | Admitting: Family Medicine

## 2023-05-07 ENCOUNTER — Ambulatory Visit: Payer: 59 | Admitting: Family Medicine

## 2023-05-07 ENCOUNTER — Ambulatory Visit (HOSPITAL_COMMUNITY)
Admission: RE | Admit: 2023-05-07 | Discharge: 2023-05-07 | Disposition: A | Discharge status: Home / Self Care | Payer: 59 | Source: Ambulatory Visit | Attending: Family Medicine | Admitting: Family Medicine

## 2023-05-07 ENCOUNTER — Ambulatory Visit (HOSPITAL_COMMUNITY)
Admission: RE | Admit: 2023-05-07 | Discharge: 2023-05-07 | Disposition: A | Discharge status: Home / Self Care | Payer: 59 | Source: Ambulatory Visit | Attending: Family Medicine

## 2023-05-07 VITALS — BP 137/87 | HR 85 | Ht 61.0 in | Wt 181.4 lb

## 2023-05-07 DIAGNOSIS — Z01818 Encounter for other preprocedural examination: Secondary | ICD-10-CM | POA: Insufficient documentation

## 2023-05-07 LAB — POCT URINALYSIS DIP (MANUAL ENTRY)
Bilirubin, UA: NEGATIVE
Blood, UA: NEGATIVE
Glucose, UA: NEGATIVE mg/dL
Ketones, POC UA: NEGATIVE mg/dL
Leukocytes, UA: NEGATIVE
Nitrite, UA: NEGATIVE
Protein Ur, POC: NEGATIVE mg/dL
Spec Grav, UA: 1.02 (ref 1.010–1.025)
Urobilinogen, UA: 0.2 U/dL
pH, UA: 7 (ref 5.0–8.0)

## 2023-05-07 NOTE — Progress Notes (Signed)
    SUBJECTIVE:   CHIEF COMPLAINT / HPI:   Presents for preop valuation for surgical clearance  Patient will be getting a tummy tuck with fat transfer to back and hips on 05/29/23 with [facility] Dr. Rosalio Loud  Surgical history includes breast reduction, hysterectomy, C-section.  Denies significant reaction to anesthesia aside from nausea  No history of bleeding complications, bleeding disorders, heart or lung disease.  Does not smoke  Patient was seen 11/5 for this although that was too early to perform the clearance exam.  In the meantime she was supposed to check with the surgical team about whether she needs lab work and imaging and whether she can remain on gabapentin during her surgery.   PERTINENT  PMH / PSH: as above  OBJECTIVE:   BP 137/87   Pulse 85   Ht 5\' 1"  (1.549 m)   Wt 181 lb 6 oz (82.3 kg)   SpO2 100%   BMI 34.27 kg/m   General: NAD, pleasant, able to participate in exam Cardiac: RRR, no murmurs auscultated Respiratory: CTAB, normal WOB Abdomen: soft, non-tender, non-distended, normoactive bowel sounds Extremities: warm and well perfused, no edema or cyanosis Skin: warm and dry, no rashes noted Neuro: alert, no obvious focal deficits, speech normal Psych: Normal affect and mood  ASSESSMENT/PLAN:   Assessment & Plan Pre-operative exam Cleared for procedure, low risk.  The patient did confirm with the surgery office that she will need to stop all of her medications including gabapentin 2 weeks prior to her procedure.  We discussed weaning over the next week to help transition her off and manage her pain with Tylenol.  Obtaining multiple labs and studies as below per request of the plastic surgery office. EKG unremarkable, NSR without acute ischemic findings. UA unremarkable  Orders Placed This Encounter  Procedures   DG Chest 2 View    Standing Status:   Future    Standing Expiration Date:   05/06/2024    Order Specific Question:   Reason for  Exam (SYMPTOM  OR DIAGNOSIS REQUIRED)    Answer:   preoperative evaluation    Order Specific Question:   Is patient pregnant?    Answer:   No    Order Specific Question:   Preferred imaging location?    Answer:   [facility]   Hepatitis panel, acute   Hemoglobin A1c   CBC with Differential   Comprehensive metabolic panel   hCG, serum, qualitative   HIV antibody (with reflex)   Protime-INR   APTT   TSH   T4, Free   T3 Uptake   POCT urinalysis dipstick   EKG 12-Lead      Vonna Drafts, MD [facility]

## 2023-05-07 NOTE — Patient Instructions (Addendum)
I will let you know if any of your results are abnormal and/or require treatment  Consider weaning down on gabapentin by taking less and less every day over the next week, and using Tylenol to control your pain otherwise.

## 2023-05-07 NOTE — Addendum Note (Signed)
Addended by: Jennette Bill on: 05/07/2023 12:09 PM   Modules accepted: Orders

## 2023-05-08 LAB — T4, FREE: Free T4: 1.29 ng/dL (ref 0.82–1.77)

## 2023-05-08 LAB — HEMOGLOBIN A1C
Est. average glucose Bld gHb Est-mCnc: 111 mg/dL
Hgb A1c MFr Bld: 5.5 % (ref 4.8–5.6)

## 2023-05-08 LAB — PROTIME-INR
INR: 1 (ref 0.9–1.2)
Prothrombin Time: 10.9 s (ref 9.1–12.0)

## 2023-05-08 LAB — COMPREHENSIVE METABOLIC PANEL
ALT: 15 [IU]/L (ref 0–32)
AST: 22 [IU]/L (ref 0–40)
Albumin: 4.2 g/dL (ref 3.9–4.9)
Alkaline Phosphatase: 82 [IU]/L (ref 44–121)
BUN/Creatinine Ratio: 13 (ref 9–23)
BUN: 9 mg/dL (ref 6–24)
Bilirubin Total: 0.4 mg/dL (ref 0.0–1.2)
CO2: 24 mmol/L (ref 20–29)
Calcium: 9.3 mg/dL (ref 8.7–10.2)
Chloride: 104 mmol/L (ref 96–106)
Creatinine, Ser: 0.72 mg/dL (ref 0.57–1.00)
Globulin, Total: 2.3 g/dL (ref 1.5–4.5)
Glucose: 90 mg/dL (ref 70–99)
Potassium: 4 mmol/L (ref 3.5–5.2)
Sodium: 143 mmol/L (ref 134–144)
Total Protein: 6.5 g/dL (ref 6.0–8.5)
eGFR: 102 mL/min/{1.73_m2} (ref >59)

## 2023-05-08 LAB — HCV INTERPRETATION

## 2023-05-08 LAB — CBC WITH DIFFERENTIAL/PLATELET
Basophils Absolute: 0 10*3/uL (ref 0.0–0.2)
Basos: 0 %
EOS (ABSOLUTE): 0 10*3/uL (ref 0.0–0.4)
Eos: 0 %
Hematocrit: 42.9 % (ref 34.0–46.6)
Hemoglobin: 13.9 g/dL (ref 11.1–15.9)
Immature Grans (Abs): 0 10*3/uL (ref 0.0–0.1)
Immature Granulocytes: 0 %
Lymphocytes Absolute: 2.1 10*3/uL (ref 0.7–3.1)
Lymphs: 39 %
MCH: 27.2 pg (ref 26.6–33.0)
MCHC: 32.4 g/dL (ref 31.5–35.7)
MCV: 84 fL (ref 79–97)
Monocytes Absolute: 0.3 10*3/uL (ref 0.1–0.9)
Monocytes: 6 %
Neutrophils Absolute: 2.9 10*3/uL (ref 1.4–7.0)
Neutrophils: 55 %
Platelets: 351 10*3/uL (ref 150–450)
RBC: 5.11 x10E6/uL (ref 3.77–5.28)
RDW: 13.1 % (ref 11.7–15.4)
WBC: 5.4 10*3/uL (ref 3.4–10.8)

## 2023-05-08 LAB — HIV ANTIBODY (ROUTINE TESTING W REFLEX): HIV Screen 4th Generation wRfx: NONREACTIVE

## 2023-05-08 LAB — ACUTE VIRAL HEPATITIS (HAV, HBV, HCV)
HCV Ab: NONREACTIVE
Hep A IgM: NEGATIVE
Hep B C IgM: NEGATIVE
Hepatitis B Surface Ag: NEGATIVE

## 2023-05-08 LAB — APTT: aPTT: 28 s (ref 24–33)

## 2023-05-08 LAB — HCG, SERUM, QUALITATIVE: hCG,Beta Subunit,Qual,Serum: NEGATIVE m[IU]/mL (ref <6)

## 2023-05-08 LAB — TSH: TSH: 1.62 u[IU]/mL (ref 0.450–4.500)

## 2023-05-08 LAB — T3 UPTAKE: T3 Uptake Ratio: 26 % (ref 24–39)

## 2023-05-09 ENCOUNTER — Encounter: Payer: Self-pay | Admitting: Family Medicine

## 2023-05-13 ENCOUNTER — Encounter: Payer: Self-pay | Admitting: Family Medicine

## 2023-05-14 ENCOUNTER — Telehealth: Payer: Self-pay

## 2023-05-14 NOTE — Telephone Encounter (Signed)
Patient calls nurse line in regards to lab work.   She reports [facility] in [place] received the "paperwork" for surgical clearance.   However, they have not received the lab work.   Will forward to provider who saw patient.

## 2023-05-16 NOTE — Telephone Encounter (Signed)
All orders faxed to [facility].

## 2023-06-11 ENCOUNTER — Encounter: Payer: Self-pay | Admitting: Family Medicine

## 2023-06-11 ENCOUNTER — Emergency Department (HOSPITAL_BASED_OUTPATIENT_CLINIC_OR_DEPARTMENT_OTHER)
Admission: EM | Admit: 2023-06-11 | Discharge: 2023-06-11 | Disposition: A | Discharge status: Home / Self Care | Payer: 59 | Source: Home / Self Care

## 2023-06-11 ENCOUNTER — Ambulatory Visit (INDEPENDENT_AMBULATORY_CARE_PROVIDER_SITE_OTHER): Payer: 59 | Admitting: Family Medicine

## 2023-06-11 VITALS — BP 141/63 | HR 91 | Wt 183.6 lb

## 2023-06-11 DIAGNOSIS — Z9889 Other specified postprocedural states: Secondary | ICD-10-CM | POA: Diagnosis not present

## 2023-06-11 NOTE — Patient Instructions (Signed)
Your incision does not appear infected at this time. Continue to care for the wound as directed by your surgeon. I can refer you toa surgeon here in [place] for potential drain removal. I would ultimately recommend following up with the surgeon in [place].

## 2023-06-11 NOTE — Progress Notes (Cosign Needed)
    SUBJECTIVE:   CHIEF COMPLAINT / HPI:   Drain removal Had a BBL and tummy tuck in [place] with a Engineer, petroleum on 05/28/23. She has had a drain in place since this time. They told her to come to the PCP office for drain removal. She would also like [place] to take a look at the incisions, as they may have come apart.  OBJECTIVE:   BP (!) 141/63   Pulse 91   Wt 183 lb 9.6 oz (83.3 kg)   SpO2 98%   BMI 34.69 kg/m   General: Alert and oriented, in NAD Skin: Warm, dry; well-healing surgical incision longitudinally over lower abdomen mostly covered with surgical tape though partial dehiscence seen towards middle portion as below though reassuringly with evidence of fibrinoid exudate and minimal granulation tissue HEENT: NCAT, EOM grossly normal, midline nasal septum Respiratory: Breathing and speaking comfortably on RA Abdominal: Soft, wounds as appreciated above, singular drain in place overlying superior left mons pubis without drainage or bleeding around drain insertion; minimal drain output Extremities: Moves all extremities grossly equally Neurological: No gross focal deficit Psychiatric: Appropriate mood and affect     ASSESSMENT/PLAN:   Status post plastic surgery She is now 2 weeks postop.  Reassuringly without signs of wound infection at this time.  Unfortunately, not able to remove drain in our office given surgical nature.  Offered to refer to general surgery here in [place]; however, she called her surgeon's office in [place] who instructed her to go to urgent care for drain removal.  Discussed returning to PCP or surgeon should she start to develop more pain, bleeding, drainage, redness of the surgical site.   Janeal Holmes, MD [facility]

## 2023-06-12 DIAGNOSIS — Z9889 Other specified postprocedural states: Secondary | ICD-10-CM | POA: Insufficient documentation

## 2023-06-12 NOTE — Assessment & Plan Note (Signed)
 She is now 2 weeks postop.  Reassuringly without signs of wound infection at this time.  Unfortunately, not able to remove drain in our office given surgical nature.  Offered to refer to general surgery here in [place]; however, she called her surgeon's office in [place] who instructed her to go to urgent care for drain removal.  Discussed returning to PCP or surgeon should she start to develop more pain, bleeding, drainage, redness of the surgical site.

## 2023-06-19 ENCOUNTER — Other Ambulatory Visit: Payer: Self-pay

## 2023-06-19 ENCOUNTER — Other Ambulatory Visit: Payer: Self-pay | Admitting: Family Medicine

## 2023-06-19 MED ORDER — MELOXICAM 15 MG PO TABS: 15.0000 mg | ORAL_TABLET | Freq: Every day | ORAL | 0 refills | Status: DC

## 2023-07-24 ENCOUNTER — Other Ambulatory Visit: Payer: Self-pay | Admitting: Family Medicine

## 2023-08-09 ENCOUNTER — Ambulatory Visit (INDEPENDENT_AMBULATORY_CARE_PROVIDER_SITE_OTHER): Payer: 59 | Admitting: Family Medicine

## 2023-08-09 ENCOUNTER — Encounter: Payer: Self-pay | Admitting: Family Medicine

## 2023-08-09 VITALS — BP 131/79 | HR 66 | Ht 61.0 in | Wt 181.0 lb

## 2023-08-09 DIAGNOSIS — N951 Menopausal and female climacteric states: Secondary | ICD-10-CM | POA: Diagnosis not present

## 2023-08-09 MED ORDER — ESTROGENS CONJUGATED 0.625 MG/GM VA CREA: 0.5000 g | TOPICAL_CREAM | Freq: Every day | VAGINAL | 1 refills | Status: AC

## 2023-08-09 NOTE — Progress Notes (Signed)
  Date of Visit: 08/09/2023   SUBJECTIVE:   HPI:  Joanna "Selena Batten" presents today for vaginal dryness.  Reports history of prior hysterectomy years ago for benign reasons.  Has recently noticed increased vaginal dryness.  Has tried Replens and K-Y jelly, other lubricants.  Does have some hot flashes which she has been handling okay.  Does have some mood swings.  Biggest symptom is vaginal dryness that bothers her.  She is wary of systemic estrogen because of things she has heard about it.  OBJECTIVE:   BP 131/79   Pulse 66   Ht 5\' 1"  (1.549 m)   Wt 181 lb (82.1 kg)   SpO2 100%   BMI 34.20 kg/m  Gen: No acute distress, pleasant, cooperative, well-appearing HEENT: Normocephalic, atraumatic Neuro: Grossly nonfocal, speech normal  ASSESSMENT/PLAN:   Assessment & Plan Menopausal vaginal dryness Discussed options, after shared decision making elected to start vaginal estrogen cream.  Declined pelvic exam today. Follow-up if not improving.    [place] J. Pollie Meyer, MD [facility]

## 2023-08-09 NOTE — Patient Instructions (Signed)
 It was great to see you again today.  Sent in premarin cream for you to try  Be well, Dr. Pollie Meyer  Vaginal Dryness and Thinning (Atrophic Vaginitis): What to Know  Atrophic vaginitis is a condition where the lining of the vagina becomes thin, dry, and inflamed. This condition can make you more likely to: Get an infection. Have pain during sex. Have other uncomfortable symptoms. Tell your health care provider if you notice any changes in your vagina. They can help you find ways to feel better. They can also treat infections and suggest healthy habits for a healthy vagina. What are the causes? Atrophic vaginitis is caused by a drop in estrogen. It's more common when menstrual periods stop during menopause. This often starts between ages 62-55 and may get worse over time as estrogen levels get lower. Estrogen helps to: Keep the vagina moist. Lubricate the vagina during sex. Protect against infection. A drop in estrogen can lead to: Thinner and drier vaginal lining. A smaller, less stretchy vagina. What increases the risk? Many factors make you more likely to have vaginal dryness and thinning. These include: Taking medicines that block estrogen. Having your ovaries taken out. Cancer treatment, such as: Radiation. Chemotherapy. Having recently delivered a baby, especially if not a vaginal delivery. Breastfeeding. Being over age 52. Having an eating disorder. Smoking. What are the signs or symptoms? Pain, soreness, or bleeding during sex. Burning, irritation, or itching around your vagina. Loss of interest in sex. Burning pain while peeing or peeing often. Vaginal discharge. It may be: White. Wallace Cullens. Yellow. Blood-tinged. Thick. Watery. Sometimes, there are no symptoms. How is this diagnosed? This condition is diagnosed based on: Your medical history. A pelvic exam to check the tissue in the vagina. Rarely, you may have more tests. These include: A pee test to check for  a urinary tract infection. An acid balance check in the vagina. How is this treated? Treatment depends on your symptoms. Treatment may include: Lubricants for the vagina. Long-acting moisturizers. Low-dose estrogen. These come in: Creams. Tablets. Vaginal rings. Treatment may not be needed if your symptoms are mild and you're not having sex. Talk to your provider about any history of breast cancer, endometrial cancer, or blood clots. Follow these instructions at home: Medicines Take your medicines only as told. Do not use herbal or other medicines unless your provider says it's safe. Use creams, lubricants, or moisturizers only as told. General instructions If your dryness and thinning is related to menopause, talk about your symptoms and treatment options with your provider. Do not douche. Do not use products that make your vagina dry. These include: Scented sprays. Tampons. Soaps. Use water-soluble lubricant or moisturizer if sex is painful. Having sex more often can improve blood flow and make the vaginal tissue more stretchy. Contact a health care provider if: Your discharge from your vagina changes in color and has a smell. You have new symptoms. Your symptoms don't get better with treatment. Your symptoms get worse. This information is not intended to replace advice given to you by your health care provider. Make sure you discuss any questions you have with your health care provider. Document Revised: 01/08/2023 Document Reviewed: 01/08/2023 Elsevier Patient Education  2024 [employer].

## 2023-08-13 ENCOUNTER — Ambulatory Visit (INDEPENDENT_AMBULATORY_CARE_PROVIDER_SITE_OTHER): Admitting: Family Medicine

## 2023-08-13 VITALS — BP 124/70 | HR 108 | Temp 100.0°F | Ht 61.0 in | Wt 180.0 lb

## 2023-08-13 DIAGNOSIS — U071 COVID-19: Secondary | ICD-10-CM | POA: Diagnosis not present

## 2023-08-13 LAB — POC SOFIA 2 FLU + SARS ANTIGEN FIA
Influenza A, POC: NEGATIVE
Influenza B, POC: NEGATIVE
SARS Coronavirus 2 Ag: POSITIVE — AB

## 2023-08-13 MED ORDER — OSELTAMIVIR PHOSPHATE 75 MG PO CAPS: 75.0000 mg | ORAL_CAPSULE | Freq: Two times a day (BID) | ORAL | 0 refills | Status: AC

## 2023-08-13 NOTE — Progress Notes (Cosign Needed Addendum)
    SUBJECTIVE:   CHIEF COMPLAINT / HPI:   Sick symptoms Symptoms started on Saturday.  Her daughter has the same symptoms that started on Friday.  Symptoms include cough, congestion, diarrhea without blood, and bodyaches.  No objective fevers.  No nausea.  Has been keeping food and drink down.  Has been urinating as normal.  She has been taking Tylenol and oscillococcinum.  PERTINENT  PMH / PSH: Sickle cell trait  OBJECTIVE:   BP 124/70   Pulse (!) 108   Temp 100 F (37.8 C) (Oral)   Ht 5\' 1"  (1.549 m)   Wt 180 lb (81.6 kg)   SpO2 98%   BMI 34.01 kg/m   General: Alert and oriented, in NAD Skin: Warm, dry, and intact HEENT: NCAT, EOM grossly normal, midline nasal septum Cardiac: Mildly tachycardic, regular rhythm, no m/r/g appreciated, capillary refill less than 2 seconds Respiratory: CTAB, breathing and speaking comfortably on RA Extremities: Moves all extremities grossly equally Neurological: No gross focal deficit Psychiatric: Appropriate mood and affect   ASSESSMENT/PLAN:   COVID-19 infection History and exam most concerning for viral process, and swab +COVID-19.  Reassuringly no focal findings on exam. However, given close contact with daughter who tested positive for flu A, will also send in Tamiflu.  Patient endorsed difficulty with taking Tamiflu and wanted to take azithromycin instead; education provided on avoidance of antibiotics and viral infections.  Also recommended conservative management with continued OTC meds including Tylenol/ibuprofen as well as honey and warm tea.  Discussed returning to care should she have chest pain, increasing shortness of breath, difficulty taking fluids, or otherwise is not improving.  Janeal Holmes, MD [facility]

## 2023-08-13 NOTE — Patient Instructions (Addendum)
 Your swab was positive for COVID-19. However, since you have been in close contact with the flu, I will send in Tamiflu. I also recommend honey in warm tea, tylenol/motrin as needed, staying hydrated, humidifier in the bedroom, and steam showers. Let me know if you do not improve in the next couple of days or get worse!

## 2023-08-26 ENCOUNTER — Other Ambulatory Visit: Payer: Self-pay | Admitting: Family Medicine

## 2023-09-03 ENCOUNTER — Emergency Department (HOSPITAL_BASED_OUTPATIENT_CLINIC_OR_DEPARTMENT_OTHER)

## 2023-09-03 ENCOUNTER — Emergency Department (HOSPITAL_BASED_OUTPATIENT_CLINIC_OR_DEPARTMENT_OTHER): Admitting: Radiology

## 2023-09-03 ENCOUNTER — Emergency Department (HOSPITAL_BASED_OUTPATIENT_CLINIC_OR_DEPARTMENT_OTHER)
Admission: EM | Admit: 2023-09-03 | Discharge: 2023-09-03 | Disposition: A | Discharge status: Home / Self Care | Attending: Emergency Medicine | Admitting: Emergency Medicine

## 2023-09-03 ENCOUNTER — Encounter (HOSPITAL_BASED_OUTPATIENT_CLINIC_OR_DEPARTMENT_OTHER): Payer: Self-pay

## 2023-09-03 DIAGNOSIS — R079 Chest pain, unspecified: Secondary | ICD-10-CM | POA: Insufficient documentation

## 2023-09-03 DIAGNOSIS — R519 Headache, unspecified: Secondary | ICD-10-CM | POA: Insufficient documentation

## 2023-09-03 LAB — CBC WITH DIFFERENTIAL/PLATELET
Abs Immature Granulocytes: 0.02 10*3/uL (ref 0.00–0.07)
Basophils Absolute: 0 10*3/uL (ref 0.0–0.1)
Basophils Relative: 0 %
Eosinophils Absolute: 0.1 10*3/uL (ref 0.0–0.5)
Eosinophils Relative: 1 %
HCT: 39.1 % (ref 36.0–46.0)
Hemoglobin: 13.2 g/dL (ref 12.0–15.0)
Immature Granulocytes: 0 %
Lymphocytes Relative: 31 %
Lymphs Abs: 2.7 10*3/uL (ref 0.7–4.0)
MCH: 25.5 pg — ABNORMAL LOW (ref 26.0–34.0)
MCHC: 33.8 g/dL (ref 30.0–36.0)
MCV: 75.5 fL — ABNORMAL LOW (ref 80.0–100.0)
Monocytes Absolute: 0.7 10*3/uL (ref 0.1–1.0)
Monocytes Relative: 8 %
Neutro Abs: 5.4 10*3/uL (ref 1.7–7.7)
Neutrophils Relative %: 60 %
Platelets: 352 10*3/uL (ref 150–400)
RBC: 5.18 MIL/uL — ABNORMAL HIGH (ref 3.87–5.11)
RDW: 14.7 % (ref 11.5–15.5)
WBC: 8.8 10*3/uL (ref 4.0–10.5)
nRBC: 0 % (ref 0.0–0.2)

## 2023-09-03 LAB — COMPREHENSIVE METABOLIC PANEL
ALT: 11 U/L (ref 0–44)
AST: 15 U/L (ref 15–41)
Albumin: 3.9 g/dL (ref 3.5–5.0)
Alkaline Phosphatase: 63 U/L (ref 38–126)
Anion gap: 9 (ref 5–15)
BUN: 10 mg/dL (ref 6–20)
CO2: 27 mmol/L (ref 22–32)
Calcium: 9 mg/dL (ref 8.9–10.3)
Chloride: 107 mmol/L (ref 98–111)
Creatinine, Ser: 0.73 mg/dL (ref 0.44–1.00)
GFR, Estimated: >60 mL/min (ref >60)
Glucose, Bld: 122 mg/dL — ABNORMAL HIGH (ref 70–99)
Potassium: 3.4 mmol/L — ABNORMAL LOW (ref 3.5–5.1)
Sodium: 143 mmol/L (ref 135–145)
Total Bilirubin: 0.6 mg/dL (ref 0.0–1.2)
Total Protein: 6.6 g/dL (ref 6.5–8.1)

## 2023-09-03 LAB — TROPONIN I (HIGH SENSITIVITY): Troponin I (High Sensitivity): 2 ng/L (ref <18)

## 2023-09-03 NOTE — Discharge Instructions (Signed)
 As we discussed, your exam, labs and xrays are all very reassuring. You can be discharged home and are encouraged to see your doctor for recheck and any further outpatient testing felt indicated if your symptoms persist.

## 2023-09-03 NOTE — ED Triage Notes (Signed)
 Pt c/o sharp pains on L temple, heaviness in chest, blurred vision onset approx 0740, since improved. Hx migraines, advises "it feels different." Pt denies additional neuro complaint, NV, weakness.

## 2023-09-03 NOTE — ED Provider Notes (Signed)
 [place] EMERGENCY DEPARTMENT AT [facility] Provider Note   CSN: [id number] Arrival date & time: 09/03/23  1123     History  Chief Complaint  Patient presents with   Chest Pain   Headache    Joanna [name] is a 48 y.o. female.  Patient to ED for evaluation of symptoms that started this morning of brief, sharp pain over the left temporal lobe extending to left lateral neck lasting seconds. No modifying factors. History of migraines but never with these specific symptoms. The headache started around 7:30 this morning. Later in the morning she started having left sided, intermittent chest heaviness that extended to the left arm. No SOB, nausea. No history of similar chest pain. No lower extremity edema. No congestion, fever, cough.  The history is provided by the patient. No language interpreter was used.  Chest Pain Associated symptoms: headache   Headache      Home Medications Prior to Admission medications   Medication Sig Start Date End Date Taking? Authorizing Provider  conjugated estrogens (PREMARIN) vaginal cream Place 0.25 Applicatorfuls vaginally daily. For 2 weeks, then decrease to 1-3 times a week as needed 08/09/23   Latrelle Dodrill, MD  gabapentin (NEURONTIN) 300 MG capsule TAKE 1 OR 2 CAPSULES BY MOUTH UP TO 3 TIMES A DAY AS NEEDED 04/24/23   Carney Living, MD  meloxicam (MOBIC) 15 MG tablet Take 1 tablet (15 mg total) by mouth daily. Patient taking differently: Take 15 mg by mouth daily as needed. 06/19/23   Judi Saa, DO  tiZANidine (ZANAFLEX) 4 MG tablet TAKE 1 TABLET BY MOUTH NIGHTLY FOR 10 DAYS AS DIRECTED BY MD 08/27/23   Judi Saa, DO  Vitamin D, Ergocalciferol, (DRISDOL) 1.25 MG (50000 UNIT) CAPS capsule TAKE 1 CAPSULE (50,000 UNITS TOTAL) BY MOUTH EVERY 7 (SEVEN) DAYS 01/31/23   Judi Saa, DO      Allergies    Patient has no known allergies.    Review of Systems   Review of Systems  Cardiovascular:  Positive  for chest pain.  Neurological:  Positive for headaches.    Physical Exam Updated Vital Signs BP 126/86   Pulse 84   Temp 98.5 F (36.9 C)   Resp (!) 21   SpO2 100%  Physical Exam Constitutional:      Appearance: She is well-developed.  HENT:     Head: Normocephalic.  Cardiovascular:     Rate and Rhythm: Normal rate and regular rhythm.     Heart sounds: No murmur heard. Pulmonary:     Effort: Pulmonary effort is normal.     Breath sounds: Normal breath sounds. No wheezing, rhonchi or rales.  Chest:     Chest wall: No tenderness.  Abdominal:     General: Bowel sounds are normal.     Palpations: Abdomen is soft.     Tenderness: There is no abdominal tenderness. There is no guarding or rebound.  Musculoskeletal:        General: Normal range of motion.     Cervical back: Normal range of motion and neck supple.     Right lower leg: No edema.     Left lower leg: No edema.  Skin:    General: Skin is warm and dry.  Neurological:     General: No focal deficit present.     Mental Status: She is alert and oriented to person, place, and time.     ED Results / Procedures / Treatments  Labs (all labs ordered are listed, but only abnormal results are displayed) Labs Reviewed  COMPREHENSIVE METABOLIC PANEL - Abnormal; Notable for the following components:      Result Value   Potassium 3.4 (*)    Glucose, Bld 122 (*)    All other components within normal limits  CBC WITH DIFFERENTIAL/PLATELET - Abnormal; Notable for the following components:   RBC 5.18 (*)    MCV 75.5 (*)    MCH 25.5 (*)    All other components within normal limits  TROPONIN I (HIGH SENSITIVITY)    EKG None  Radiology DG Chest 2 View Result Date: 09/03/2023 CLINICAL DATA:  Chest pain and heaviness. EXAM: CHEST - 2 VIEW COMPARISON:  05/07/2023 FINDINGS: Normal sized heart. Clear lungs with normal vascularity. Thoracic spine degenerative changes. IMPRESSION: No acute abnormality. Electronically Signed    By: Beckie Salts M.D.   On: 09/03/2023 13:07   CT Head Wo Contrast Result Date: 09/03/2023 CLINICAL DATA:  48 year old female sudden severe headache. Sharp left side pain. Blurred vision since 0740 hours. EXAM: CT HEAD WITHOUT CONTRAST TECHNIQUE: Contiguous axial images were obtained from the base of the skull through the vertex without intravenous contrast. RADIATION DOSE REDUCTION: This exam was performed according to the departmental dose-optimization program which includes automated exposure control, adjustment of the mA and/or kV according to patient size and/or use of iterative reconstruction technique. COMPARISON:  Head CT 01/20/2009. FINDINGS: Brain: Cerebral volume is within normal limits. No midline shift, ventriculomegaly, mass effect, evidence of mass lesion, intracranial hemorrhage or evidence of cortically based acute infarction. Gray-white matter differentiation is within normal limits throughout the brain. Vascular: No suspicious intracranial vascular hyperdensity. Skull: Intact, negative. Sinuses/Orbits: Visualized paranasal sinuses and mastoids are clear. Other: Visualized orbits and scalp soft tissues are within normal limits. IMPRESSION: Normal noncontrast Head CT. Electronically Signed   By: Odessa Fleming M.D.   On: 09/03/2023 12:02    Procedures Procedures    Medications Ordered in ED Medications - No data to display  ED Course/ Medical Decision Making/ A&P Clinical Course as of 09/03/23 1538  Mon Sep 03, 2023  1519 C/O headache and chest discomfort as reported in HPI. Very well appearing. VSS. Labs unremarkable, including troponin 2. CXR clear. Head CT performed as part of triage process is negative. EKG show no ischemia and is similar to past/comparable EKG. Do not feel chest symptoms represent ACS, dissection, PE, PNA, PTX. She has a normal neurologic exam and negative Head CT. Do not suspect intracranial bleed or mass.   Will treat symptoms and encourage outpatient follow up to  continue evaluation of symptoms.  [SU]    Clinical Course User Index [SU] Elpidio Anis, PA-C                                 Medical Decision Making Amount and/or Complexity of Data Reviewed Labs: ordered. Radiology: ordered.           Final Clinical Impression(s) / ED Diagnoses Final diagnoses:  Nonspecific chest pain  Acute nonintractable headache, unspecified headache type    Rx / DC Orders ED Discharge Orders     None         Danne Harbor 09/03/23 1538    Ernie Avena, MD 09/04/23 [phone]

## 2023-09-10 ENCOUNTER — Other Ambulatory Visit: Payer: Self-pay | Admitting: Family Medicine

## 2023-09-10 DIAGNOSIS — G609 Hereditary and idiopathic neuropathy, unspecified: Secondary | ICD-10-CM

## 2023-09-27 ENCOUNTER — Other Ambulatory Visit: Payer: Self-pay | Admitting: Family Medicine

## 2023-10-11 ENCOUNTER — Ambulatory Visit: Admitting: Family Medicine

## 2023-10-11 ENCOUNTER — Encounter: Payer: Self-pay | Admitting: Family Medicine

## 2023-10-11 VITALS — BP 136/76 | HR 69 | Ht 61.0 in | Wt 181.8 lb

## 2023-10-11 DIAGNOSIS — Z111 Encounter for screening for respiratory tuberculosis: Secondary | ICD-10-CM | POA: Diagnosis not present

## 2023-10-11 DIAGNOSIS — M797 Fibromyalgia: Secondary | ICD-10-CM

## 2023-10-11 DIAGNOSIS — Z1231 Encounter for screening mammogram for malignant neoplasm of breast: Secondary | ICD-10-CM

## 2023-10-11 NOTE — Patient Instructions (Addendum)
 Good to see you today - Thank you for coming in  Things we discussed today: I have filled out your paperwork for work.  We completed your PPD. Please call the number provided to schedule your mammogram Please make an appointment if you have any concerns

## 2023-10-11 NOTE — Assessment & Plan Note (Signed)
 Stable, taking gabapentin  300 mg, meloxicam  15 mg as needed, and tizanidine  4 mg at night for pain

## 2023-10-11 NOTE — Progress Notes (Signed)
    SUBJECTIVE:   CHIEF COMPLAINT / HPI:   Presented to get forms filled out for new job No concerns today Due for mammogram, appears to be up-to-date on other health maintenance  PERTINENT  PMH / PSH: Migraine, lumbar radiculopathy, hysterectomy  OBJECTIVE:   BP 136/76   Pulse 69   Ht 5\' 1"  (1.549 m)   Wt 181 lb 12.8 oz (82.5 kg)   SpO2 100%   BMI 34.35 kg/m   GEN: Well-appearing, no acute distress Ambulates independently without difficulty  ASSESSMENT/PLAN:   Assessment & Plan Fibromyalgia Stable, taking gabapentin  300 mg, meloxicam  15 mg as needed, and tizanidine  4 mg at night for pain Encounter for screening mammogram for malignant neoplasm of breast Mammogram ordered, patient provided with resources to call Screening-pulmonary TB Nurse visit scheduled for tomorrow to complete PPD test Follow-up as needed  Sarahann Cumins, DO [facility]

## 2023-10-12 ENCOUNTER — Ambulatory Visit: Payer: Self-pay

## 2023-12-04 ENCOUNTER — Other Ambulatory Visit: Payer: Self-pay | Admitting: Family Medicine

## 2023-12-04 DIAGNOSIS — G609 Hereditary and idiopathic neuropathy, unspecified: Secondary | ICD-10-CM

## 2023-12-19 ENCOUNTER — Ambulatory Visit
Admission: EM | Admit: 2023-12-19 | Discharge: 2023-12-19 | Disposition: A | Discharge status: Home / Self Care | Attending: Family Medicine | Admitting: Family Medicine

## 2023-12-19 DIAGNOSIS — B349 Viral infection, unspecified: Secondary | ICD-10-CM

## 2023-12-19 DIAGNOSIS — R0989 Other specified symptoms and signs involving the circulatory and respiratory systems: Secondary | ICD-10-CM

## 2023-12-19 MED ORDER — PREDNISONE 10 MG PO TABS: 30.0000 mg | ORAL_TABLET | Freq: Every day | ORAL | 0 refills | Status: DC

## 2023-12-19 MED ORDER — PROMETHAZINE-DM 6.25-15 MG/5ML PO SYRP
5.0000 mL | ORAL_SOLUTION | Freq: Three times a day (TID) | ORAL | 0 refills | Status: DC | PRN
Start: 2023-12-19 — End: 2024-03-13

## 2023-12-19 NOTE — ED Triage Notes (Signed)
 Pt prod cough, head/chest congestion x 4 days-denies fever-some relief with mucinex -NAD-steady gait

## 2023-12-19 NOTE — Discharge Instructions (Signed)
 We will manage this as a viral syndrome. For sore throat or cough try using a honey-based tea. Use 3 teaspoons of honey with juice squeezed from half lemon. Place shaved pieces of ginger into 1/2-1 cup of water and warm over stove top. Then mix the ingredients and repeat every 4 hours as needed. Please take Tylenol  500mg -650mg  once every 6 hours for fevers, aches and pains. Hydrate very well with at least 2 liters (64 ounces) of water. Eat light meals such as soups (chicken and noodles, chicken wild rice, vegetable).  Do not eat any foods that you are allergic to.  Start an antihistamine like Zyrtec  (10mg  daily) for postnasal drainage, sinus congestion.  You can take this together with prednisone  to help clear up your chest congestion, respiratory symptoms Use cough syrup as needed.

## 2023-12-19 NOTE — ED Provider Notes (Signed)
 [facility] - URGENT CARE CENTER  Note:  This document was prepared using Conservation officer, historic buildings and may include unintentional dictation errors.  MRN: [id number] DOB: 1975-07-24  Subjective:   Joanna [name] is a 48 y.o. female presenting for 4-day history of sinus congestion, throat congestion, upper chest congestion, productive cough with slight streaks of blood noticed yesterday but not today. No fever, shob, chest pain, wheezing, nausea, vomiting, abdominal pain, rashes.  No history of asthma. No smoking of any kind including cigarettes, cigars, vaping, marijuana use.  Has remote history of bronchitis.  No current facility-administered medications for this encounter.  Current Outpatient Medications:    conjugated estrogens  (PREMARIN ) vaginal cream, Place 0.25 Applicatorfuls vaginally daily. For 2 weeks, then decrease to 1-3 times a week as needed, Disp: 60 g, Rfl: 1   gabapentin  (NEURONTIN ) 300 MG capsule, TAKE 1 OR 2 CAPSULES BY MOUTH UP TO 3 TIMES A DAY AS NEEDED, Disp: 180 capsule, Rfl: 0   meloxicam  (MOBIC ) 15 MG tablet, Take 1 tablet (15 mg total) by mouth daily as needed., Disp: 90 tablet, Rfl: 1   tiZANidine  (ZANAFLEX ) 4 MG tablet, TAKE 1 TABLET BY MOUTH NIGHTLY FOR 10 DAYS AS DIRECTED BY MD, Disp: 30 tablet, Rfl: 5   Vitamin D , Ergocalciferol , (DRISDOL ) 1.25 MG (50000 UNIT) CAPS capsule, TAKE 1 CAPSULE (50,000 UNITS TOTAL) BY MOUTH EVERY 7 (SEVEN) DAYS, Disp: 12 capsule, Rfl: 1   No Known Allergies  Past Medical History:  Diagnosis Date   Anemia    during the time she had fibroids   Anxiety    Arthritis    Depression    Heart murmur    born with a heart murmur, but cleared up years ago   Migraine    occasional   PONV (postoperative nausea and vomiting)    some dry heaving on waking up     Past Surgical History:  Procedure Laterality Date   ABDOMINAL HYSTERECTOMY     BREAST SURGERY     CESAREAN SECTION     RADIOLOGY WITH ANESTHESIA N/A 12/04/2019    Procedure: MRI PELVIS WITH AND WITHOUT CONTRAST,LUMBER SPINE WITHOUT CONTRAST;  Surgeon: Radiologist, Medication, MD;  Location: [facility] OR;  Service: Radiology;  Laterality: N/A;   REDUCTION MAMMAPLASTY Bilateral 2001    Family History  Problem Relation Age of Onset   Colon cancer Neg Hx    Colon polyps Neg Hx    Esophageal cancer Neg Hx    Stomach cancer Neg Hx    Rectal cancer Neg Hx     Social History   Tobacco Use   Smoking status: Former   Smokeless tobacco: Never   Tobacco comments:    as of 12/02/19 has been 15 years  Vaping Use   Vaping status: Never Used  Substance Use Topics   Alcohol use: No   Drug use: No    ROS   Objective:   Vitals: BP 138/84 (BP Location: Left Arm)   Pulse 73   Temp 98.1 F (36.7 C) (Oral)   Resp 18   SpO2 98%   Physical Exam Constitutional:      General: She is not in acute distress.    Appearance: Normal appearance. She is well-developed and normal weight. She is not ill-appearing, toxic-appearing or diaphoretic.  HENT:     Head: Normocephalic and atraumatic.     Right Ear: Tympanic membrane, ear canal and external ear normal. No drainage or tenderness. No middle ear effusion. There is no impacted  cerumen. Tympanic membrane is not erythematous or bulging.     Left Ear: Tympanic membrane, ear canal and external ear normal. No drainage or tenderness.  No middle ear effusion. There is no impacted cerumen. Tympanic membrane is not erythematous or bulging.     Nose: Congestion present. No rhinorrhea.     Mouth/Throat:     Mouth: Mucous membranes are moist. No oral lesions.     Pharynx: No pharyngeal swelling, oropharyngeal exudate, posterior oropharyngeal erythema or uvula swelling.     Tonsils: No tonsillar exudate or tonsillar abscesses.     Comments: Cobblestone pattern postnasal drainage overlying pharynx. Eyes:     General: No scleral icterus.       Right eye: No discharge.        Left eye: No discharge.     Extraocular  Movements: Extraocular movements intact.     Right eye: Normal extraocular motion.     Left eye: Normal extraocular motion.     Conjunctiva/sclera: Conjunctivae normal.  Cardiovascular:     Rate and Rhythm: Normal rate and regular rhythm.     Heart sounds: Normal heart sounds. No murmur heard.    No friction rub. No gallop.  Pulmonary:     Effort: Pulmonary effort is normal. No respiratory distress.     Breath sounds: No stridor. No wheezing, rhonchi or rales.  Chest:     Chest wall: No tenderness.  Musculoskeletal:     Cervical back: Normal range of motion and neck supple.  Lymphadenopathy:     Cervical: No cervical adenopathy.  Skin:    General: Skin is warm and dry.  Neurological:     General: No focal deficit present.     Mental Status: She is alert and oriented to person, place, and time.  Psychiatric:        Mood and Affect: Mood normal.        Behavior: Behavior normal.     Assessment and Plan :   PDMP not reviewed this encounter.  1. Acute viral syndrome   2. Chest congestion    Patient requested prednisone  for her significant respiratory symptoms.  I was agreeable to this at 30 mg for 5 days.  Recommend supportive care otherwise for a general viral syndrome.  Deferred imaging given clear cardiopulmonary exam, hemodynamically stable vital signs.  Discussed antibiotic stewardship and patient was agreeable.  Counseled patient on potential for adverse effects with medications prescribed/recommended today, ER and return-to-clinic precautions discussed, patient verbalized understanding.    Christopher Savannah, PA-C 12/19/23 1043

## 2023-12-29 ENCOUNTER — Other Ambulatory Visit: Payer: Self-pay | Admitting: Family Medicine

## 2023-12-29 DIAGNOSIS — G609 Hereditary and idiopathic neuropathy, unspecified: Secondary | ICD-10-CM

## 2024-01-27 ENCOUNTER — Other Ambulatory Visit: Payer: Self-pay | Admitting: Family Medicine

## 2024-02-19 ENCOUNTER — Other Ambulatory Visit: Payer: Self-pay | Admitting: Family Medicine

## 2024-02-19 DIAGNOSIS — G609 Hereditary and idiopathic neuropathy, unspecified: Secondary | ICD-10-CM

## 2024-02-22 ENCOUNTER — Other Ambulatory Visit: Payer: Self-pay | Admitting: Family Medicine

## 2024-03-04 ENCOUNTER — Other Ambulatory Visit: Payer: Self-pay | Admitting: Family Medicine

## 2024-03-10 NOTE — Telephone Encounter (Signed)
Patient scheduled 9/30

## 2024-03-11 ENCOUNTER — Encounter: Payer: Self-pay | Admitting: Family Medicine

## 2024-03-11 ENCOUNTER — Ambulatory Visit (INDEPENDENT_AMBULATORY_CARE_PROVIDER_SITE_OTHER): Payer: PRIVATE HEALTH INSURANCE | Admitting: Family Medicine

## 2024-03-11 ENCOUNTER — Other Ambulatory Visit: Payer: Self-pay

## 2024-03-11 VITALS — BP 118/84 | HR 84 | Ht 61.0 in | Wt 185.0 lb

## 2024-03-11 DIAGNOSIS — G8929 Other chronic pain: Secondary | ICD-10-CM | POA: Diagnosis not present

## 2024-03-11 DIAGNOSIS — M25562 Pain in left knee: Secondary | ICD-10-CM

## 2024-03-11 DIAGNOSIS — M222X2 Patellofemoral disorders, left knee: Secondary | ICD-10-CM | POA: Diagnosis not present

## 2024-03-11 MED ORDER — MELOXICAM 15 MG PO TABS: 15.0000 mg | ORAL_TABLET | Freq: Every day | ORAL | 1 refills | Status: DC | PRN

## 2024-03-11 MED ORDER — TIZANIDINE HCL 4 MG PO TABS: 4.0000 mg | ORAL_TABLET | Freq: Every day | ORAL | 1 refills | Status: AC

## 2024-03-11 NOTE — Patient Instructions (Addendum)
 Meloxicam  Zanaflex  refilled Do prescribed exercises at least 3x a week Ice regularly Xray today See you again in 2 months

## 2024-03-11 NOTE — Assessment & Plan Note (Signed)
 Left-sided patellofemoral syndrome.  Exercises given, discussed icing regimen and home exercises, patient wanted to hold on any type of injection.  Discussed icing regimen, discussed which activities to do and which ones to avoid.  Increase activity as tolerated.  If worsening pain consider injection and x-rays.  Patient declined x-rays today.  Meloxicam  15 mg daily prescribed as well as Zanaflex  given for nighttime pain.

## 2024-03-11 NOTE — Progress Notes (Signed)
 [name] [address] [place] [address] Phone: [phone] Subjective:   Joanna [name], am serving as a scribe for Dr. Arthea Joanna.  I'[name] seeing this patient by the request  of:  Joanna Laymon PARAS, MD  CC: Left knee pain  YEP:Dlagzrupcz  Joanna [name] is a 48 y.o. female coming in with complaint of L knee pain. Stays on her knees for prolong periods during work. Feels like something is in the knee. Has done some home therapies. Only slightly better.       Past Medical History:  Diagnosis Date   Anemia    during the time she had fibroids   Anxiety    Arthritis    Depression    Heart murmur    born with a heart murmur, but cleared up years ago   Migraine    occasional   PONV (postoperative nausea and vomiting)    some dry heaving on waking up   Past Surgical History:  Procedure Laterality Date   ABDOMINAL HYSTERECTOMY     BREAST SURGERY     CESAREAN SECTION     RADIOLOGY WITH ANESTHESIA N/A 12/04/2019   Procedure: MRI PELVIS WITH AND WITHOUT CONTRAST,LUMBER SPINE WITHOUT CONTRAST;  Surgeon: Radiologist, Medication, MD;  Location: [facility] OR;  Service: Radiology;  Laterality: N/A;   REDUCTION MAMMAPLASTY Bilateral 2001   Social History   Socioeconomic History   Marital status: Divorced    Spouse name: Not on file   Number of children: Not on file   Years of education: Not on file   Highest education level: Not on file  Occupational History   Not on file  Tobacco Use   Smoking status: Former   Smokeless tobacco: Never   Tobacco comments:    as of 12/02/19 has been 15 years  Vaping Use   Vaping status: Never Used  Substance and Sexual Activity   Alcohol use: No   Drug use: No   Sexual activity: Yes    Birth control/protection: Surgical  Other Topics Concern   Not on file  Social History Narrative   Works as Environmental health practitioner   Daughter - 2008.   Social Drivers of Corporate investment banker Strain: Not on file   Food Insecurity: No Food Insecurity (08/09/2023)   Hunger Vital Sign    Worried About Running Out of Food in the Last Year: Never true    Ran Out of Food in the Last Year: Never true  Transportation Needs: Not on file  Physical Activity: Not on file  Stress: Not on file  Social Connections: Not on file   No Known Allergies Family History  Problem Relation Age of Onset   Colon cancer Neg Hx    Colon polyps Neg Hx    Esophageal cancer Neg Hx    Stomach cancer Neg Hx    Rectal cancer Neg Hx     Current Outpatient Medications (Endocrine & Metabolic):    predniSONE  (DELTASONE ) 10 MG tablet, Take 3 tablets (30 mg total) by mouth daily with breakfast.   Current Outpatient Medications (Respiratory):    promethazine -dextromethorphan  (PROMETHAZINE -DM) 6.25-15 MG/5ML syrup, Take 5 mLs by mouth 3 (three) times daily as needed for cough.  Current Outpatient Medications (Analgesics):    meloxicam  (MOBIC ) 15 MG tablet, Take 1 tablet (15 mg total) by mouth daily as needed.   Current Outpatient Medications (Other):    tiZANidine  (ZANAFLEX ) 4 MG tablet, Take 1 tablet (4 mg total)  by mouth at bedtime.   conjugated estrogens  (PREMARIN ) vaginal cream, Place 0.25 Applicatorfuls vaginally daily. For 2 weeks, then decrease to 1-3 times a week as needed   gabapentin  (NEURONTIN ) 300 MG capsule, TAKE 1 OR 2 CAPSULES BY MOUTH UP TO 3 TIMES A DAY AS NEEDED   tiZANidine  (ZANAFLEX ) 4 MG tablet, TAKE 1 TABLET BY MOUTH NIGHTLY FOR 10 DAYS AS DIRECTED BY MD   Vitamin D , Ergocalciferol , (DRISDOL ) 1.25 MG (50000 UNIT) CAPS capsule, TAKE 1 CAPSULE (50,000 UNITS TOTAL) BY MOUTH EVERY 7 (SEVEN) DAYS   Reviewed prior external information including notes and imaging from  primary care provider As well as notes that were available from care everywhere and other healthcare systems.  Past medical history, social, surgical and family history all reviewed in electronic medical record.  No pertanent information unless  stated regarding to the chief complaint.   Review of Systems:  No headache, visual changes, nausea, vomiting, diarrhea, constipation, dizziness, abdominal pain, skin rash, fevers, chills, night sweats, weight loss, swollen lymph nodes, body aches, joint swelling, chest pain, shortness of breath, mood changes. POSITIVE muscle aches  Objective  Blood pressure 118/84, pulse 84, height 5' 1 (1.549 [name]), weight 185 lb (83.9 kg), SpO2 98%.   General: No apparent distress alert and oriented x3 mood and affect normal, dressed appropriately.  HEENT: Pupils equal, extraocular movements intact  Respiratory: Patient's speak in full sentences and does not appear short of breath  Cardiovascular: No lower extremity edema, non tender, no erythema  Left knee does have some mild crepitus noted.  Some lateral tracking of the patella noted.  No pain on the medial aspect of the knee.  Limited muscular skeletal ultrasound was performed and interpreted by Joanna [name]   Limited ultrasound shows some hypoechoic changes in the patellofemoral joint noted.  Minimal narrowing noted.  Some mild narrowing of the medial joint space also noted.  Meniscus appear to be unremarkable.   Impression and Recommendations:     The above documentation has been reviewed and is accurate and complete Joanna [name] [name] Joanna Tassinari, DO

## 2024-03-13 ENCOUNTER — Encounter: Payer: Self-pay | Admitting: Family Medicine

## 2024-03-13 ENCOUNTER — Ambulatory Visit (INDEPENDENT_AMBULATORY_CARE_PROVIDER_SITE_OTHER): Admitting: Family Medicine

## 2024-03-13 VITALS — BP 129/83 | HR 68 | Ht 61.5 in | Wt 184.2 lb

## 2024-03-13 DIAGNOSIS — R4589 Other symptoms and signs involving emotional state: Secondary | ICD-10-CM | POA: Diagnosis present

## 2024-03-13 DIAGNOSIS — N951 Menopausal and female climacteric states: Secondary | ICD-10-CM | POA: Diagnosis not present

## 2024-03-13 DIAGNOSIS — Z0000 Encounter for general adult medical examination without abnormal findings: Secondary | ICD-10-CM | POA: Diagnosis not present

## 2024-03-13 MED ORDER — HYDROXYZINE HCL 10 MG PO TABS: 10.0000 mg | ORAL_TABLET | Freq: Two times a day (BID) | ORAL | 0 refills | Status: DC | PRN

## 2024-03-13 MED ORDER — SERTRALINE HCL 50 MG PO TABS: 50.0000 mg | ORAL_TABLET | Freq: Every day | ORAL | 1 refills | Status: DC

## 2024-03-13 NOTE — Assessment & Plan Note (Signed)
 Doing well on premarin  cream, continue

## 2024-03-13 NOTE — Assessment & Plan Note (Signed)
 Experiencing sadness and emotional distress after grandmother's death, impacting daily life. Previously benefited from sertraline . Family history of depression. Hydroxyzine considered for acute anxiety. - Prescribed sertraline  25 mg for two days, then 50 mg daily. Discussed it will take 4-6 weeks for peak effect at this dose. - Prescribed hydroxyzine 10 mg as needed for acute anxiety, up to two tablets. - Provided list of therapists for counseling. - Scheduled follow-up in one month to assess treatment response.

## 2024-03-13 NOTE — Progress Notes (Signed)
  Date of Visit: 03/13/2024   SUBJECTIVE:   HPI:  Discussed the use of AI scribe software for clinical note transcription with the patient, who gave verbal consent to proceed.  History of Present Illness Joanna [name] is a 48 year old female who presents with feelings of sadness and grief following the recent loss of her grandmother.  Depressive symptoms and grief - Persistent feelings of sadness and grief since the recent passing of her grandmother, similar to symptoms experienced after her mother's death last year - Symptoms intensified following the recent loss, resulting in feeling overwhelmed and impaired daily functioning - Sadness is particularly pronounced during family birthdays, which occur from September to December - Experienced a significant emotional breakdown around her daughter's birthday last year and fears recurrence of similar symptoms this year - No history of suicidal ideation or psychiatric hospitalization - Desires to improve emotional well-being, especially with her upcoming 48th birthday - Previously prescribed Cymbalta  for fibromyalgia, discontinued due to sensation of sluggishness - Past prescription of sertraline , discontinued independently but says she did well on it. She is interested in medications to help.  Menopausal symptoms - using premarin  cream intermittently with good relief  OBJECTIVE:   BP 129/83   Pulse 68   Ht 5' 1.5 (1.562 m)   Wt 184 lb 3.2 oz (83.6 kg)   SpO2 99%   BMI 34.24 kg/m  Gen: no acute distress, pleasant, cooperative, well appearing HEENT: normocephalic, atraumatic  Lungs: normal work of breathing on room air  Neuro: alert, speech normal, grossly nonfocal Psych: normal range of affect, well groomed, speech normal in rate and volume, normal eye contact   ASSESSMENT/PLAN:   Assessment & Plan Depressed mood Experiencing sadness and emotional distress after grandmother's death, impacting daily life. Previously  benefited from sertraline . Family history of depression. Hydroxyzine considered for acute anxiety. - Prescribed sertraline  25 mg for two days, then 50 mg daily. Discussed it will take 4-6 weeks for peak effect at this dose. - Prescribed hydroxyzine 10 mg as needed for acute anxiety, up to two tablets. - Provided list of therapists for counseling. - Scheduled follow-up in one month to assess treatment response. Menopausal vaginal dryness Doing well on premarin  cream, continue Routine adult health maintenance Discussed flu vaccination and addressed concerns. Recommended mammogram. - Provided handout on scheduling a mammogram. - Encouraged consideration of flu vaccination.   FOLLOW UP: Follow up in 1 mo for above issues  [place] J. Donah, MD [facility]

## 2024-03-13 NOTE — Assessment & Plan Note (Signed)
 Discussed flu vaccination and addressed concerns. Recommended mammogram. - Provided handout on scheduling a mammogram. - Encouraged consideration of flu vaccination.

## 2024-03-13 NOTE — Patient Instructions (Addendum)
 It was great to see you again today.  Start sertraline  50mg  daily. For today and tomorrow take 1/2 of a pill Also sending in hydroxyzine, which you can take if you need help calming down in the moment See therapy list below Follow up in 1 month   To schedule your mammogram, call the [facility] at [phone]. They are located at [address], [address].  Be well, Dr. Donah   Therapy and Counseling Resources Most providers on this list will take Medicaid. Patients with commercial insurance or Medicare should contact their insurance company to get a list of in network providers.  [facility] (takes children) Location 1: [address] [place], [place] [address] Location 2: [address] [place], [place] [address] [phone]   [facility] (spanish speaking therapist available)(habla espanol)(take medicare and medicaid)  [address], [place], [place] [address], [place]  [email or web address] [phone]  [facility] [address] [facility] [address] [place], [place] [address] [phone]  [facility]   [address] [place], [place] [address]      [phone]  [facility] (spanish available) [address]  [place], [place] [address] [phone]  [facility] (take [employer] and medicare) [address], [address]  [place], [place] [address]       [phone]     [facility] (virtual only) [phone]  [facility] [address], [place], [place] [phone]  [facility]:  [address] [place] [phone]  [facility]:  [address] [place] [phone]  [facility] (under & uninsured) [address]   [place] [phone]    [email or web address]    [facility] [address]  [place]     [phone]  [facility] -[address]     Phone:  [phone]     [facility]-  [address]  [phone]   [facility] [address]      [place], [place] [phone]  [facility]: [address] [place], [place], [place]  [phone]   [facility] (Spanish therapist) [email or web address]  [address] [place]  [address]   [place] [address]    [phone]    [facility]   [address],  [place], [place]  [phone]   [facility]  [address] [place]  [phone]  [facility]  [address] [place], [place]        [phone]  Open Access/Walk In Clinic under & uninsured  [facility]  [address] [place], [place] [phone] Crisis [phone]  [facility] [address],  (Spanish)   [address] , [place]: [phone]) 8:30 - 12; 1 - 2:30  [facility] [employer],  [address] [place], [place]    [phone]):8:30 - 12; 2 - 3PM  [facility] [employer],  [address]  [place]; [phone]):   Mon - Fri 8 AM - 5 PM  [facility] [address] [place]  MWF 12:30 to 3:00 or call to schedule an appointment  [phone]  Specific Provider options Psychology Today  [email or web address]  click on find a therapist  enter your zip code left side and select or tailor  a therapist for your specific need.   [facility] Provider Directory [email or web address]  (Medicaid)   Follow all drop down to find a provider  Social Support program [facility] [phone] or [employer] [address], [place], [place] Recovery support and educational   24- Hour Availability:   [facility]  [address] [place], [place]  [phone] Crisis [phone]  [facility] [phone]  [facility]  [phone]   [facility] [place] [facility]  [phone] (after hours)  [facility]rapeutic [facility]/Mobile Crisis   [phone]  [place]  National Suicide Hotline  515-875-8573 MERRILYN)  Call 911 or go to emergency room  [facility]  [phone]);  [place] and [employer]  [phone]); [place], [place], [place], [place], [place], [place], [place]

## 2024-03-20 ENCOUNTER — Other Ambulatory Visit: Payer: Self-pay | Admitting: Family Medicine

## 2024-04-03 ENCOUNTER — Ambulatory Visit
Admission: EM | Admit: 2024-04-03 | Discharge: 2024-04-03 | Disposition: A | Discharge status: Home / Self Care | Attending: Family Medicine | Admitting: Family Medicine

## 2024-04-03 ENCOUNTER — Other Ambulatory Visit: Payer: Self-pay

## 2024-04-03 DIAGNOSIS — M545 Low back pain, unspecified: Secondary | ICD-10-CM

## 2024-04-03 LAB — POCT URINE DIPSTICK
Bilirubin, UA: NEGATIVE
Blood, UA: NEGATIVE
Glucose, UA: NEGATIVE mg/dL
Ketones, POC UA: NEGATIVE mg/dL
Leukocytes, UA: NEGATIVE
Nitrite, UA: NEGATIVE
POC PROTEIN,UA: NEGATIVE
Spec Grav, UA: 1.015 (ref 1.010–1.025)
Urobilinogen, UA: 0.2 U/dL
pH, UA: 5.5 (ref 5.0–8.0)

## 2024-04-03 MED ORDER — METHOCARBAMOL 500 MG PO TABS: 500.0000 mg | ORAL_TABLET | Freq: Two times a day (BID) | ORAL | 0 refills | Status: AC | PRN

## 2024-04-03 NOTE — Discharge Instructions (Addendum)
 You may take your meloxicam  that you have at home.  You can start Robaxin twice daily as needed.  Please note this is a muscle relaxer and can make you tired.  Do not drink alcohol or drive while on this medication.  Heat to the back and lots of rest.  Please follow-up with your PCP if your symptoms do not improve.  Please go to the emergency room if you develop any worsening symptoms.  Hope you feel better soon!

## 2024-04-03 NOTE — ED Provider Notes (Signed)
 [facility]    CSN: [id number] Arrival date & time: 04/03/24  1450      History   Chief Complaint Chief Complaint  Patient presents with   Back Pain   Urinary Frequency    HPI Joanna [name] is a 48 y.o. female presents for back pain.  Patient reports 4 days of an intermittent bilateral lower back pain that does not radiate.  Denies any numbness/tingling/weakness of her lower extremities, no bowel or bladder incontinence, no saddle paresthesia.  Does endorse frequent urination but no dysuria or hematuria, fevers, vomiting or flank pain.  Has intermittent headaches as well.  Denies history of chronic low back pain or back surgeries.  She has not taken any OTC treatments for her symptoms.  No other concerns at this time   Back Pain Urinary Frequency    Past Medical History:  Diagnosis Date   Anemia    during the time she had fibroids   Anxiety    Arthritis    Depression    Heart murmur    born with a heart murmur, but cleared up years ago   Migraine    occasional   PONV (postoperative nausea and vomiting)    some dry heaving on waking up    Patient Active Problem List   Diagnosis Date Noted   Depressed mood 03/13/2024   Menopausal vaginal dryness 03/13/2024   Routine adult health maintenance 03/13/2024   Patellofemoral syndrome of left knee 03/11/2024   Status post plastic surgery 06/12/2023   Arthritis of carpometacarpal [facility]) joint of left thumb 04/27/2022   Obesity 12/27/2021   De Quervain's tenosynovitis, left 10/27/2021   Right ankle pain 08/18/2021   Cervical disc disorder with radiculopathy of cervical region 01/26/2021   Nonallopathic lesion of sacral region 08/15/2019   Nonallopathic lesion of lumbar region 08/15/2019   Nonallopathic lesion of thoracic region 08/15/2019   Left lumbar radiculopathy 07/21/2019   Hereditary and idiopathic peripheral neuropathy 05/13/2019   Partial nontraumatic rupture of right rotator cuff 07/24/2018    Fracture of base of fifth metatarsal bone of left foot at metaphyseal-diaphyseal junction, closed, initial encounter 01/22/2018   Peroneal tendinitis of left lower extremity 12/21/2017   Fibromyalgia 08/23/2016   Bilateral low back pain 11/21/2012   MENOPAUSE, SURGICAL 05/11/2010   SICKLE CELL TRAIT 08/09/2006   MIGRAINE, UNSPEC., W/O INTRACTABLE MIGRAINE 08/09/2006    Past Surgical History:  Procedure Laterality Date   ABDOMINAL HYSTERECTOMY     BREAST SURGERY     CESAREAN SECTION     RADIOLOGY WITH ANESTHESIA N/A 12/04/2019   Procedure: MRI PELVIS WITH AND WITHOUT CONTRAST,LUMBER SPINE WITHOUT CONTRAST;  Surgeon: Radiologist, Medication, MD;  Location: [facility] OR;  Service: Radiology;  Laterality: N/A;   REDUCTION MAMMAPLASTY Bilateral 2001    OB History   No obstetric history on file.      Home Medications    Prior to Admission medications   Medication Sig Start Date End Date Taking? Authorizing Provider  methocarbamol (ROBAXIN) 500 MG tablet Take 1 tablet (500 mg total) by mouth 2 (two) times daily as needed for muscle spasms. 04/03/24  Yes Shreyan Hinz, Jodi R, NP  conjugated estrogens  (PREMARIN ) vaginal cream Place 0.25 Applicatorfuls vaginally daily. For 2 weeks, then decrease to 1-3 times a week as needed 08/09/23   Donah Laymon PARAS, MD  gabapentin  (NEURONTIN ) 300 MG capsule TAKE 1 OR 2 CAPSULES BY MOUTH UP TO 3 TIMES A DAY AS NEEDED 02/19/24   Donah Laymon PARAS, MD  hydrOXYzine (ATARAX) 10 MG tablet TAKE 1 TABLET BY MOUTH 2 TIMES DAILY AS NEEDED. 03/20/24   Delores Suzann HERO, MD  meloxicam  (MOBIC ) 15 MG tablet Take 1 tablet (15 mg total) by mouth daily as needed. 03/11/24   Smith, Zachary M, DO  sertraline  (ZOLOFT ) 50 MG tablet Take 1 tablet (50 mg total) by mouth daily. 03/13/24   Donah Laymon PARAS, MD  tiZANidine  (ZANAFLEX ) 4 MG tablet Take 1 tablet (4 mg total) by mouth at bedtime. 03/11/24   Claudene Arthea HERO, DO  Vitamin D , Ergocalciferol , (DRISDOL ) 1.25 MG (50000 UNIT) CAPS  capsule TAKE 1 CAPSULE (50,000 UNITS TOTAL) BY MOUTH EVERY 7 (SEVEN) DAYS 01/31/23   Claudene Arthea HERO, DO    Family History Family History  Problem Relation Age of Onset   Colon cancer Neg Hx    Colon polyps Neg Hx    Esophageal cancer Neg Hx    Stomach cancer Neg Hx    Rectal cancer Neg Hx     Social History Social History   Tobacco Use   Smoking status: Former   Smokeless tobacco: Never   Tobacco comments:    as of 12/02/19 has been 15 years  Vaping Use   Vaping status: Never Used  Substance Use Topics   Alcohol use: No   Drug use: No     Allergies   Patient has no known allergies.   Review of Systems Review of Systems  Genitourinary:  Positive for frequency.  Musculoskeletal:  Positive for back pain.     Physical Exam Triage Vital Signs ED Triage Vitals  Encounter Vitals Group     BP 04/03/24 1500 137/87     Girls Systolic BP Percentile --      Girls Diastolic BP Percentile --      Boys Systolic BP Percentile --      Boys Diastolic BP Percentile --      Pulse Rate 04/03/24 1500 72     Resp 04/03/24 1500 17     Temp 04/03/24 1500 98.4 F (36.9 C)     Temp Source 04/03/24 1500 Oral     SpO2 04/03/24 1500 95 %     Weight --      Height --      Head Circumference --      Peak Flow --      Pain Score 04/03/24 1459 5     Pain Loc --      Pain Education --      Exclude from Growth Chart --    No data found.  Updated Vital Signs BP 137/87   Pulse 72   Temp 98.4 F (36.9 C) (Oral)   Resp 17   SpO2 95%   Visual Acuity Right Eye Distance:   Left Eye Distance:   Bilateral Distance:    Right Eye Near:   Left Eye Near:    Bilateral Near:     Physical Exam Vitals and nursing note reviewed.  Constitutional:      General: She is not in acute distress.    Appearance: Normal appearance. She is not ill-appearing.  HENT:     Head: Normocephalic and atraumatic.  Eyes:     Pupils: Pupils are equal, round, and reactive to light.  Cardiovascular:      Rate and Rhythm: Normal rate.  Pulmonary:     Effort: Pulmonary effort is normal.  Abdominal:     Tenderness: There is no right CVA tenderness or left CVA tenderness.  Musculoskeletal:     Thoracic back: Normal.     Lumbar back: Spasms and tenderness present. No swelling, edema, deformity, signs of trauma, lacerations or bony tenderness. Normal range of motion. Negative right straight leg raise test and negative left straight leg raise test. No scoliosis.       Back:     Comments: Strength 5 out of 5 bilateral lower extremities.  Skin:    General: Skin is warm and dry.  Neurological:     General: No focal deficit present.     Mental Status: She is alert and oriented to person, place, and time.  Psychiatric:        Mood and Affect: Mood normal.        Behavior: Behavior normal.      UC Treatments / Results  Labs (all labs ordered are listed, but only abnormal results are displayed) Labs Reviewed  POCT URINE DIPSTICK    EKG   Radiology No results found.  Procedures Procedures (including critical care time)  Medications Ordered in UC Medications - No data to display  Initial Impression / Assessment and Plan / UC Course  I have reviewed the triage vital signs and the nursing notes.  Pertinent labs & imaging results that were available during my care of the patient were reviewed by me and considered in my medical decision making (see chart for details).     Reviewed exam and symptoms with patient.  No red flags.  Urine negative for UTI.  Discussed musculoskeletal cause of pain.  She declines Toradol  injection and will take her meloxicam  when she gets home.  Will do trial of Robaxin twice daily as needed.  Discussed heat rest and PCP follow-up if symptoms do not improve.  ER precautions reviewed Final Clinical Impressions(s) / UC Diagnoses   Final diagnoses:  Acute bilateral low back pain without sciatica     Discharge Instructions      You may take your  meloxicam  that you have at home.  You can start Robaxin twice daily as needed.  Please note this is a muscle relaxer and can make you tired.  Do not drink alcohol or drive while on this medication.  Heat to the back and lots of rest.  Please follow-up with your PCP if your symptoms do not improve.  Please go to the emergency room if you develop any worsening symptoms.  Hope you feel better soon!    ED Prescriptions     Medication Sig Dispense Auth. Provider   methocarbamol (ROBAXIN) 500 MG tablet Take 1 tablet (500 mg total) by mouth 2 (two) times daily as needed for muscle spasms. 14 tablet Garrison Michie, Jodi R, NP      PDMP not reviewed this encounter.   Loreda Myla SAUNDERS, NP 04/03/24 1525

## 2024-04-03 NOTE — ED Triage Notes (Signed)
 Pt c/o lower back pain, int HA, frequent urinationx4d.

## 2024-04-04 ENCOUNTER — Other Ambulatory Visit: Payer: Self-pay | Admitting: Family Medicine

## 2024-04-09 ENCOUNTER — Other Ambulatory Visit: Payer: Self-pay | Admitting: Family Medicine

## 2024-04-09 DIAGNOSIS — G609 Hereditary and idiopathic neuropathy, unspecified: Secondary | ICD-10-CM

## 2024-05-02 ENCOUNTER — Other Ambulatory Visit: Payer: Self-pay | Admitting: Family Medicine

## 2024-05-05 ENCOUNTER — Other Ambulatory Visit: Payer: Self-pay | Admitting: Family Medicine

## 2024-05-05 ENCOUNTER — Ambulatory Visit (INDEPENDENT_AMBULATORY_CARE_PROVIDER_SITE_OTHER): Admitting: Family Medicine

## 2024-05-05 ENCOUNTER — Encounter: Payer: Self-pay | Admitting: Family Medicine

## 2024-05-05 VITALS — BP 130/64 | HR 72 | Ht 61.5 in | Wt 189.6 lb

## 2024-05-05 DIAGNOSIS — F419 Anxiety disorder, unspecified: Secondary | ICD-10-CM | POA: Diagnosis not present

## 2024-05-05 DIAGNOSIS — R4589 Other symptoms and signs involving emotional state: Secondary | ICD-10-CM

## 2024-05-05 MED ORDER — SERTRALINE HCL 50 MG PO TABS: 100.0000 mg | ORAL_TABLET | Freq: Every day | ORAL | 2 refills | Status: DC

## 2024-05-05 NOTE — Assessment & Plan Note (Signed)
 Gradually improving, but patient still reports symptoms and would like to increase her SSRI.  Reassuringly, patient is also following up with therapist soon. - Increase sertraline  to 100 mg daily, counseled on gradual onset effect over course of 4-6 weeks - Continue hydroxyzine  10 mg as needed, currently taking rarely - Patient plans to contact therapist for first session soon

## 2024-05-05 NOTE — Progress Notes (Deleted)
 [name] [address] [place] [address] Phone: [phone] Subjective:    I'm seeing this patient by the request  of:  Donah Laymon PARAS, MD  CC:   YEP:Dlagzrupcz  03/11/2024 Left-sided patellofemoral syndrome.  Exercises given, discussed icing regimen and home exercises, patient wanted to hold on any type of injection.  Discussed icing regimen, discussed which activities to do and which ones to avoid.  Increase activity as tolerated.  If worsening pain consider injection and x-rays.  Patient declined x-rays today.  Meloxicam  15 mg daily prescribed as well as Zanaflex  given for nighttime pain.     Updated 05/12/2024 Joanna [name] is a 48 y.o. female coming in with complaint of L knee pain       Past Medical History:  Diagnosis Date   Anemia    during the time she had fibroids   Anxiety    Arthritis    Depression    Heart murmur    born with a heart murmur, but cleared up years ago   Migraine    occasional   PONV (postoperative nausea and vomiting)    some dry heaving on waking up   Past Surgical History:  Procedure Laterality Date   ABDOMINAL HYSTERECTOMY     BREAST SURGERY     CESAREAN SECTION     RADIOLOGY WITH ANESTHESIA N/A 12/04/2019   Procedure: MRI PELVIS WITH AND WITHOUT CONTRAST,LUMBER SPINE WITHOUT CONTRAST;  Surgeon: Radiologist, Medication, MD;  [facility]: [facility];  Service: Radiology;  Laterality: N/A;   REDUCTION MAMMAPLASTY Bilateral 2001   Social History   Socioeconomic History   Marital status: Divorced    Spouse name: Not on file   Number of children: Not on file   Years of education: Not on file   Highest education level: Not on file  Occupational History   Not on file  Tobacco Use   Smoking status: Former   Smokeless tobacco: Never   Tobacco comments:    as of 12/02/19 has been 15 years  Vaping Use   Vaping status: Never Used  Substance and Sexual Activity   Alcohol use: No   Drug use: No    Sexual activity: Yes    Birth control/protection: Surgical  Other Topics Concern   Not on file  Social History Narrative   Works as environmental health practitioner   Daughter - 2008.   Social Drivers of Corporate Investment Banker Strain: Not on file  Food Insecurity: No Food Insecurity (08/09/2023)   Hunger Vital Sign    Worried About Running Out of Food in the Last Year: Never true    Ran Out of Food in the Last Year: Never true  Transportation Needs: Not on file  Physical Activity: Not on file  Stress: Not on file  Social Connections: Not on file   No Known Allergies Family History  Problem Relation Age of Onset   Colon cancer Neg Hx    Colon polyps Neg Hx    Esophageal cancer Neg Hx    Stomach cancer Neg Hx    Rectal cancer Neg Hx        Current Outpatient Medications (Analgesics):    meloxicam  (MOBIC ) 15 MG tablet, Take 1 tablet (15 mg total) by mouth daily as needed.   Current Outpatient Medications (Other):    conjugated estrogens  (PREMARIN ) vaginal cream, Place 0.25 Applicatorfuls vaginally daily. For 2 weeks, then decrease to 1-3 times a week as needed   gabapentin  (  NEURONTIN ) 300 MG capsule, TAKE 1 OR 2 CAPSULES BY MOUTH UP TO 3 TIMES A DAY AS NEEDED   hydrOXYzine  (ATARAX ) 10 MG tablet, TAKE 1 TABLET BY MOUTH 2 TIMES DAILY AS NEEDED.   methocarbamol  (ROBAXIN ) 500 MG tablet, Take 1 tablet (500 mg total) by mouth 2 (two) times daily as needed for muscle spasms.   sertraline  (ZOLOFT ) 50 MG tablet, Take 1 tablet (50 mg total) by mouth daily.   tiZANidine  (ZANAFLEX ) 4 MG tablet, Take 1 tablet (4 mg total) by mouth at bedtime.   Vitamin D , Ergocalciferol , (DRISDOL ) 1.25 MG (50000 UNIT) CAPS capsule, TAKE 1 CAPSULE (50,000 UNITS TOTAL) BY MOUTH EVERY 7 (SEVEN) DAYS   Reviewed prior external information including notes and imaging from  primary care provider As well as notes that were available from care everywhere and other healthcare systems.  Past medical history, social,  surgical and family history all reviewed in electronic medical record.  No pertanent information unless stated regarding to the chief complaint.   Review of Systems:  No headache, visual changes, nausea, vomiting, diarrhea, constipation, dizziness, abdominal pain, skin rash, fevers, chills, night sweats, weight loss, swollen lymph nodes, body aches, joint swelling, chest pain, shortness of breath, mood changes. POSITIVE muscle aches  Objective  There were no vitals taken for this visit.   General: No apparent distress alert and oriented x3 mood and affect normal, dressed appropriately.  HEENT: Pupils equal, extraocular movements intact  Respiratory: Patient's speak in full sentences and does not appear short of breath  Cardiovascular: No lower extremity edema, non tender, no erythema      Impression and Recommendations:

## 2024-05-05 NOTE — Patient Instructions (Signed)
 It was great to see you today! Thank you for choosing [facility] for your primary care.  Today we addressed: 1. Depressed mood We have increased your sertraline  to 100 mg daily.  Please let us  know if this is not helping still and we can add another medication.  However, starting therapy is a great choice and should help as well.  We can discuss ADHD testing at the return visit.  You should return to our clinic in 4-8 weeks.  Thank you for coming to see us  at [facility] and for the opportunity to care for you! Toma, Aida Lemaire, MD 05/05/2024, 3:40 PM

## 2024-05-05 NOTE — Progress Notes (Signed)
   SUBJECTIVE:   CHIEF COMPLAINT / HPI:  Joanna [name] is a 48 y.o. female with a pertinent past medical history of migraine, fibromyalgia, anxiety, and depression presenting to the clinic for follow-up of depressed mood.  Depression, anxiety Patient was last seen in [facility] on 10/2 for depressed mood. - Prescribed sertraline  25 mg for two days, then 50 mg daily - Prescribed hydroxyzine  10 mg as needed for acute anxiety, up to two tablets - Provided list of therapists for counseling  Today, patient reports her anxiety and low mood symptoms are still present, though improved on sertraline . She reports nighttime awakening and stress. She states that the holidays are stressing her out more than normal. She states she takes hydroxyzine  approximately once per week. She would like to increase her sertraline  dose today. Patient did find therapist she would like to try to connect with, plans to call soon. Denies SI/HI.   PERTINENT PMH / PSH: Anxiety, depression For myalgia Migraines Menopausal symptoms  *Remainder reviewed in problem list.   OBJECTIVE:   BP 130/64   Pulse 72   Ht 5' 1.5 (1.562 m)   Wt 189 lb 9.6 oz (86 kg)   SpO2 99%   BMI 35.24 kg/m   General: Age-appropriate, resting comfortably in chair, NAD, alert and at baseline. Cardiovascular: Regular rate and rhythm. Normal S1/S2. No murmurs, rubs, or gallops appreciated. 2+ radial pulses. Pulmonary: Normal WOB on room air. No accessory muscle use. Extremities: No peripheral edema bilaterally. Capillary refill <2 seconds. Psych: Full range affect, pleasant and appropriate.  No hypervigilance, sitting still and calmly.  Denies SI/HI.     05/05/2024    3:14 PM  Depression screen PHQ 2/9  Decreased Interest 1  Down, Depressed, Hopeless 1  PHQ - 2 Score 2  Altered sleeping 1  Tired, decreased energy 1  Change in appetite 0  Feeling bad or failure about yourself  0  Trouble concentrating 1  Moving slowly or  fidgety/restless 0  Suicidal thoughts 0  PHQ-9 Score 5  Difficult doing work/chores Somewhat difficult    ASSESSMENT/PLAN:   Assessment & Plan Depressed mood Anxiety Gradually improving, but patient still reports symptoms and would like to increase her SSRI.  Reassuringly, patient is also following up with therapist soon. - Increase sertraline  to 100 mg daily, counseled on gradual onset effect over course of 4-6 weeks - Continue hydroxyzine  10 mg as needed, currently taking rarely - Patient plans to contact therapist for first session soon  No follow-ups on file.  Joanna Jorgenson Toma, MD [facility] [place]

## 2024-05-12 ENCOUNTER — Ambulatory Visit: Admitting: Family Medicine

## 2024-05-13 ENCOUNTER — Other Ambulatory Visit: Payer: Self-pay | Admitting: Family Medicine

## 2024-05-13 DIAGNOSIS — R4589 Other symptoms and signs involving emotional state: Secondary | ICD-10-CM

## 2024-05-21 ENCOUNTER — Encounter: Payer: Self-pay | Admitting: Family Medicine

## 2024-06-02 ENCOUNTER — Other Ambulatory Visit: Payer: Self-pay | Admitting: Family Medicine

## 2024-06-02 DIAGNOSIS — G609 Hereditary and idiopathic neuropathy, unspecified: Secondary | ICD-10-CM

## 2024-08-02 ENCOUNTER — Ambulatory Visit (HOSPITAL_BASED_OUTPATIENT_CLINIC_OR_DEPARTMENT_OTHER): Admitting: Radiology
